# Patient Record
Sex: Female | Born: 1960 | Race: White | Hispanic: No | Marital: Married | Smoking: Never smoker
Health system: Southern US, Community
[De-identification: ages and names within clinical notes are randomized; demographics above are authoritative.]

## PROBLEM LIST (undated history)

## (undated) DIAGNOSIS — G629 Polyneuropathy, unspecified: Secondary | ICD-10-CM

## (undated) DIAGNOSIS — H409 Unspecified glaucoma: Secondary | ICD-10-CM

## (undated) DIAGNOSIS — G4730 Sleep apnea, unspecified: Secondary | ICD-10-CM

## (undated) DIAGNOSIS — D649 Anemia, unspecified: Secondary | ICD-10-CM

## (undated) DIAGNOSIS — M1990 Unspecified osteoarthritis, unspecified site: Secondary | ICD-10-CM

## (undated) DIAGNOSIS — J45909 Unspecified asthma, uncomplicated: Secondary | ICD-10-CM

## (undated) DIAGNOSIS — K219 Gastro-esophageal reflux disease without esophagitis: Secondary | ICD-10-CM

## (undated) DIAGNOSIS — M810 Age-related osteoporosis without current pathological fracture: Secondary | ICD-10-CM

## (undated) DIAGNOSIS — M797 Fibromyalgia: Secondary | ICD-10-CM

## (undated) DIAGNOSIS — H269 Unspecified cataract: Secondary | ICD-10-CM

## (undated) DIAGNOSIS — T7840XA Allergy, unspecified, initial encounter: Secondary | ICD-10-CM

## (undated) DIAGNOSIS — F32A Depression, unspecified: Secondary | ICD-10-CM

## (undated) DIAGNOSIS — E079 Disorder of thyroid, unspecified: Secondary | ICD-10-CM

## (undated) DIAGNOSIS — F419 Anxiety disorder, unspecified: Secondary | ICD-10-CM

## (undated) DIAGNOSIS — I10 Essential (primary) hypertension: Secondary | ICD-10-CM

## (undated) DIAGNOSIS — E785 Hyperlipidemia, unspecified: Secondary | ICD-10-CM

## (undated) HISTORY — DX: Disorder of thyroid, unspecified: E07.9

## (undated) HISTORY — DX: Sleep apnea, unspecified: G47.30

## (undated) HISTORY — DX: Polyneuropathy, unspecified: G62.9

## (undated) HISTORY — DX: Age-related osteoporosis without current pathological fracture: M81.0

## (undated) HISTORY — DX: Anxiety disorder, unspecified: F41.9

## (undated) HISTORY — DX: Hyperlipidemia, unspecified: E78.5

## (undated) HISTORY — DX: Allergy, unspecified, initial encounter: T78.40XA

## (undated) HISTORY — DX: Unspecified osteoarthritis, unspecified site: M19.90

## (undated) HISTORY — PX: EYE SURGERY: SHX253

## (undated) HISTORY — DX: Unspecified asthma, uncomplicated: J45.909

## (undated) HISTORY — DX: Gastro-esophageal reflux disease without esophagitis: K21.9

## (undated) HISTORY — DX: Depression, unspecified: F32.A

## (undated) HISTORY — DX: Anemia, unspecified: D64.9

## (undated) HISTORY — DX: Unspecified glaucoma: H40.9

## (undated) HISTORY — DX: Unspecified cataract: H26.9

## (undated) HISTORY — DX: Essential (primary) hypertension: I10

## (undated) HISTORY — DX: Fibromyalgia: M79.7

---

## 2010-04-28 HISTORY — PX: OTHER SURGICAL HISTORY: SHX169

## 2011-10-27 HISTORY — PX: INCONTINENCE SURGERY: SHX676

## 2013-03-11 ENCOUNTER — Ambulatory Visit (INDEPENDENT_AMBULATORY_CARE_PROVIDER_SITE_OTHER): Payer: 59 | Admitting: Internal Medicine

## 2013-03-11 ENCOUNTER — Encounter: Payer: Self-pay | Admitting: Internal Medicine

## 2013-03-11 VITALS — BP 125/75 | HR 71 | Ht 63.0 in | Wt 138.8 lb

## 2013-03-11 DIAGNOSIS — E785 Hyperlipidemia, unspecified: Secondary | ICD-10-CM

## 2013-03-11 DIAGNOSIS — G589 Mononeuropathy, unspecified: Secondary | ICD-10-CM

## 2013-03-11 DIAGNOSIS — G629 Polyneuropathy, unspecified: Secondary | ICD-10-CM

## 2013-03-11 MED ORDER — PITAVASTATIN CALCIUM 4 MG PO TABS: 4.0000 mg | ORAL_TABLET | Freq: Every day | ORAL | Status: DC

## 2013-03-11 NOTE — Patient Instructions (Signed)
Your physician wants you to follow-up in: 1 year You will receive a reminder letter in the mail two months in advance. If you don't receive a letter, please call our office to schedule the follow-up appointment.   Your physician has recommended you make the following change in your medication:  START LIVALO 4 MG DAILY   Your physician recommends that you return for a FASTING lipid profile: 3 MONTHS

## 2013-03-11 NOTE — Progress Notes (Signed)
HPI Patient is a 52 yo who was referred by Dr. Ihor Dow for evaluation of hyperlipidemia The patient has known about it for years  Followed in [place] Says she tried all the statins and did not tolerate due to achiness  Lipids in 02/26/13 total cholesterol wsa 336, Trig 200, HDL 70  LDL 227    Patient denies CP  No SOB No palpitations  No PND Has neuropathy in feet she says  Denies claudication.      Allergies  Allergen Reactions  . Penicillins     Hives and tightness in chest    Current Outpatient Prescriptions  Medication Sig Dispense Refill  . DULoxetine (CYMBALTA) 60 MG capsule Take 60 mg by mouth daily.      Marland Kitchen gabapentin (NEURONTIN) 300 MG capsule Take 300 mg by mouth daily. As directed      . levothyroxine (SYNTHROID, LEVOTHROID) 75 MCG tablet Take 75 mcg by mouth daily.      . RELPAX 40 MG tablet Take 40 mg by mouth daily.      . solifenacin (VESICARE) 5 MG tablet Take 5 mg by mouth daily.       No current facility-administered medications for this visit.    No past medical history on file.  No past surgical history on file.  No family history on file.  History   Social History  . Marital Status: Married    Spouse Name: N/A    Number of Children: N/A  . Years of Education: N/A   Occupational History  . Not on file.   Social History Main Topics  . Smoking status: Never Smoker   . Smokeless tobacco: Not on file  . Alcohol Use: Not on file  . Drug Use: Not on file  . Sexual Activity: Not on file   Other Topics Concern  . Not on file   Social History Narrative  . No narrative on file    Review of Systems:  All systems reviewed.  They are negative to the above problem except as previously stated.  Vital Signs: BP 125/75  Pulse 71  Ht 5\' 3"  (1.6 m)  Wt 138 lb 12.8 oz (62.959 kg)  BMI 24.59 kg/m2  Physical Exam Patient is in NAD HEENT:  Normocephalic, atraumatic. EOMI, PERRLA.  Neck: JVP is normal.  No bruits.  Lungs: clear to auscultation. No  rales no wheezes.  Heart: Regular rate and rhythm. Normal S1, S2. No S3.   No significant murmurs. PMI not displaced.  Abdomen:  Supple, nontender. Normal bowel sounds. No masses. No hepatomegaly.  Extremities:   Good distal pulses throughout. No lower extremity edema.  Musculoskeletal :moving all extremities.  Neuro:   alert and oriented x3.  CN II-XII grossly intact.  EKG  SR 71 bpm.   Assessment and Plan:  1.  Hyperlipidemia  Patient with Type I hyperlipidemia  Has been tried on "all statins" in [place]  Last was 7 years ago  Did not tolerate due to achinkess I have given her samples of Livalo 4 mg  She is to take 1/2 every other day  If tolerates increase in 1 wk to qd  F/U lipids and AST in 12 wk Diet is good. She needs to confirm lipids for children    2.  Neuropathy  Followed by primary MD  I do not think related to vasc problem

## 2013-03-12 ENCOUNTER — Telehealth: Payer: Self-pay | Admitting: *Deleted

## 2013-03-12 ENCOUNTER — Encounter: Payer: Self-pay | Admitting: *Deleted

## 2013-03-12 NOTE — Telephone Encounter (Signed)
Received fax from [facility] regarding the letter of medical necessity for this patients LIVALO medication, "coverage is denied because your prescription benefit plan does not cover the requested medication, this decision relates specifically to patient coverage under benefit plan and does not involve any determination of medical judgement",  "you may choose to receive the medication at your own expense"  My initial letter of necessity included intolerance of crestor, simcor and vytorin due to side effects of muscle aches and pains. It was a very detailed letter with dx and failure of formulary medications.  Informed patient of outcome, she states in the past they have NOT covered this medication. I will route this note to Dr Tenny Craw

## 2013-03-12 NOTE — Telephone Encounter (Signed)
This encounter was created in error - please disregard.

## 2013-03-12 NOTE — Telephone Encounter (Signed)
[facility] states they need a letter of necessity to approve the livalo for patient

## 2013-03-12 NOTE — Telephone Encounter (Signed)
Sent in appeal letter to [facility].

## 2013-03-14 NOTE — Telephone Encounter (Signed)
Received fax from [facility] regarding appeal I sent in, "your appeal for coverage of livalo is denied because your pharmacy benefits plan does not cover this medication", " this is the final determination of your claim"

## 2013-03-19 NOTE — Telephone Encounter (Signed)
Plan to provide her with samples as available  See if she tolerates  Then see what lipid are

## 2013-04-15 ENCOUNTER — Other Ambulatory Visit: Payer: Self-pay | Admitting: *Deleted

## 2013-04-15 ENCOUNTER — Telehealth: Payer: Self-pay | Admitting: Internal Medicine

## 2013-04-15 DIAGNOSIS — E785 Hyperlipidemia, unspecified: Secondary | ICD-10-CM

## 2013-04-15 MED ORDER — PITAVASTATIN CALCIUM 4 MG PO TABS: 4.0000 mg | ORAL_TABLET | Freq: Every day | ORAL | Status: DC

## 2013-04-15 NOTE — Telephone Encounter (Signed)
New Prob   Requesting a change for LIVALO due to pt being intolerant to class of medication. Insurance will not cover. Please call.

## 2013-04-15 NOTE — Telephone Encounter (Signed)
Will forward for dr ross review 

## 2013-04-16 ENCOUNTER — Telehealth: Payer: Self-pay

## 2013-04-16 NOTE — Telephone Encounter (Signed)
Gave patient of livalo

## 2013-04-17 NOTE — Telephone Encounter (Signed)
I would like to see 1.  If she tolerates and 2.  What her lipids are on this. She should be set up for lipids 8 wks after starting. Provide patient with samples for now.

## 2013-04-17 NOTE — Telephone Encounter (Signed)
Spoke with pt, Aware of dr Tenny Crawross recommendations. Samples given and lab appt scheduled.

## 2013-04-17 NOTE — Telephone Encounter (Signed)
See other telephone note.  

## 2013-05-06 ENCOUNTER — Ambulatory Visit (INDEPENDENT_AMBULATORY_CARE_PROVIDER_SITE_OTHER): Payer: 59

## 2013-05-06 DIAGNOSIS — E785 Hyperlipidemia, unspecified: Secondary | ICD-10-CM

## 2013-05-06 LAB — LIPID PANEL
Cholesterol: 212 mg/dL — ABNORMAL HIGH (ref 0–200)
HDL: 59.6 mg/dL (ref >39.00)
Total CHOL/HDL Ratio: 4
Triglycerides: 98 mg/dL (ref 0.0–149.0)
VLDL: 19.6 mg/dL (ref 0.0–40.0)

## 2013-05-06 LAB — HEPATIC FUNCTION PANEL
ALBUMIN: 3.9 g/dL (ref 3.5–5.2)
ALT: 15 U/L (ref 0–35)
AST: 19 U/L (ref 0–37)
Alkaline Phosphatase: 45 U/L (ref 39–117)
Bilirubin, Direct: 0 mg/dL (ref 0.0–0.3)
Total Bilirubin: 0.7 mg/dL (ref 0.3–1.2)
Total Protein: 6.9 g/dL (ref 6.0–8.3)

## 2013-05-07 LAB — LDL CHOLESTEROL, DIRECT: Direct LDL: 141 mg/dL

## 2013-05-09 ENCOUNTER — Telehealth: Payer: Self-pay | Admitting: Internal Medicine

## 2013-05-09 NOTE — Telephone Encounter (Signed)
New problem ° ° °Pt returning your call concerning results.  °

## 2013-05-09 NOTE — Telephone Encounter (Signed)
Spoke with pt, aware of lipid results. She can not afford livalo. Note sent to dr Tenny Crawross to review

## 2013-05-20 ENCOUNTER — Telehealth: Payer: Self-pay | Admitting: Internal Medicine

## 2013-05-20 NOTE — Telephone Encounter (Signed)
New message     Need to know what Dr Tenny Crawoss want her to do about her high cholesterol.

## 2013-05-20 NOTE — Telephone Encounter (Signed)
Pt states she has already been on Livalo 2 mg and it this will not be an increase for her.  States the co-pay card do not work for her because of how her insurance is set up.  She would like to know if she should stay on 2 mg or if she needs to increase to 4 mg and what the next step for her would be.  Advised I will forward to Dr Tenny Crawoss and her nurse for further recommendations.

## 2013-05-20 NOTE — Telephone Encounter (Signed)
Follow up  ° ° ° °Returning call back to nurse  °

## 2013-05-20 NOTE — Telephone Encounter (Signed)
lmtcb

## 2013-05-21 NOTE — Telephone Encounter (Signed)
Spoke with pt, will not be able to maintain samples for the pt. Will send a message to [employer] [facility] to see if she would qualify for a study and then will let the pt know. Pt agreed with this plan.

## 2013-06-03 ENCOUNTER — Telehealth: Payer: Self-pay | Admitting: Internal Medicine

## 2013-06-03 NOTE — Telephone Encounter (Signed)
New Message  Pt called states that her Insurance will not pay for the current medications and would like to discuss medication dosage Change.

## 2013-06-05 NOTE — Telephone Encounter (Signed)
Spoke with pt, aware dr Tenny Crawross is reviewing to decide the next step.

## 2013-06-12 ENCOUNTER — Other Ambulatory Visit: Payer: 59

## 2013-07-17 ENCOUNTER — Telehealth: Payer: Self-pay | Admitting: Internal Medicine

## 2013-07-17 NOTE — Telephone Encounter (Signed)
New Message:  Pt is requesting a call back to discuss her options for another cholesterol medication. Pt states she is currently on livola Rx..Marland Kitchen

## 2013-07-17 NOTE — Telephone Encounter (Signed)
Spoke with pt, she is concerned about he cholesterol. She has taken all of the statins by her reports and they caused her pain. livalo was the only one she tolerated but can not afford. appt made for pt to see jeremy in the lipid clinic to discuss options.

## 2013-07-23 ENCOUNTER — Ambulatory Visit (INDEPENDENT_AMBULATORY_CARE_PROVIDER_SITE_OTHER): Payer: 59 | Admitting: Pharmacist

## 2013-07-23 VITALS — Wt 163.0 lb

## 2013-07-23 DIAGNOSIS — Z79899 Other long term (current) drug therapy: Secondary | ICD-10-CM

## 2013-07-23 DIAGNOSIS — E785 Hyperlipidemia, unspecified: Secondary | ICD-10-CM | POA: Insufficient documentation

## 2013-07-23 DIAGNOSIS — E782 Mixed hyperlipidemia: Secondary | ICD-10-CM | POA: Insufficient documentation

## 2013-07-23 MED ORDER — PITAVASTATIN CALCIUM 4 MG PO TABS: 2.0000 mg | ORAL_TABLET | Freq: Every day | ORAL | Status: DC

## 2013-07-23 MED ORDER — EZETIMIBE 10 MG PO TABS: 10.0000 mg | ORAL_TABLET | Freq: Every day | ORAL | Status: DC

## 2013-07-23 NOTE — Progress Notes (Signed)
Patient is a pleasant 53 y.o. WF referred to lipid clinic with Dr. Tenny Crawoss given she can only afford Livalo, and insurance is no longer covering this.  She has a baseline LDL of 227 mg/dL ( and non-HDL of 956266 mg/dL).  Patient failed multiple statins, including Zocor, Lipitor, Crestor, and pravastatin while living in [place]. She moved here from [place] 16 months ago due to her husbands job.  She saw Dr. Tenny Crawoss recently and started her on low dose Livalo, and now she is up to 2 mg daily of Livalo and tolerating it without complaint.  She tells me she was tried on Zetia in the past and tolerated it, however stated it was stopped as it had unknown benefit on heart disease.  Since then then IMPROVE-IT trial has come out and shown Zetia in addition to statin did reduce CV disease in patients with recent CAD.  Patient doesn't have a personal history of CAD, but does have an LDL > 220 mg/dL.  She tells me her whole father's side of the family has elevated cholesterol and heart disease.  No xanthomas found on physical exam today.   Her h/o fibromyalgia has made taking statins difficult.  Patient has [facility] for her medication insurance provider.  This is the one provider that will not cover Livalo even if documented failures to other statins have been seen.  Since Crestor is going generic next 1-2 years, [facility] apparently isn't going to cover Livalo in anticipation of this occurring.  Patient will either need to get samples or consider a different therapy.  PCSK-9 inhibitors will likely be on the market in the next 3 months, but not known what their indication will be.  She doesn't meet criteria for SPIRE-2 study at this time.  Risk Factors:  LDL > 190 mg/dL, family history CAD on father's side - LDL goal at least < 110 mg/dL (21%50% reduction from baseline - 225 mg/dL) Meds:  Livalo 2 mg qd Intolerant:  Zocor, Lipitor, Crestor, and pravastatin (in [place]) - muscle aches in legs and back  Social history:   Non-smoker.  Drinks alcohol socially only.  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   Family history:  Father's side of family all has elevated LDL and CAD.  Her father had CABG x 5 when he was 52 y.o., but never went to the doctor.  Elevated LDL also.  Mother: Brain aneurysm (smoker) Diet:  Eats a healthy breakfast and lunch.  For dinner she admits to eating out multiple times per week given her long work days (12 hours).  She does try to eat chicken with vegetables, lean meats, or salads when she does eat out.  She eats a smaller portion as well.  Eats out likely 3 times per week for dinner.  When she cooks at home, it is healthy.  Doesn't eat desserts.  Drinks 1 diet soda per day.  Drinks unsweet tea. Exercise:  Very little due to her fibromyalgia and peripheral neuropathy.  Labs: 04/2013:  LDL 141, TC 212, TG 98, HDL 60 (on Livalo 2 mg qd) 02/2013:  LDL 227, TC 336, TG 200, HDL 70 (not on lipid lowering therapy)  Current Outpatient Prescriptions  Medication Sig Dispense Refill  . DULoxetine (CYMBALTA) 60 MG capsule Take 60 mg by mouth daily.      Marland Kitchen. gabapentin (NEURONTIN) 300 MG capsule Take 300 mg by mouth daily. As directed      .  levothyroxine (SYNTHROID, LEVOTHROID) 75 MCG tablet Take 75 mcg by mouth daily.      . Pitavastatin Calcium (LIVALO) 4 MG TABS Take 1 tablet (4 mg total) by mouth daily.  30 tablet  11  . RELPAX 40 MG tablet Take 40 mg by mouth daily.      . solifenacin (VESICARE) 5 MG tablet Take 5 mg by mouth daily.       No current facility-administered medications for this visit.   Allergies  Allergen Reactions  . Penicillins     Hives and tightness in chest  . Statins     Muscle aches in past on pravastatin, Crestor, Zocor, Lipitor - while in [place]   No family history on file.

## 2013-07-23 NOTE — Assessment & Plan Note (Addendum)
Given patient tolerating Livalo 2 mg qd (1/2 of 4 mg pills) and can't get this covered by her insurance, we will give her some more samples of this today.  Given results of IMPROVE-IT, and the fact she was able to tolerate this in the past, will have her restart Zetia 10 mg qd today as well.  Would like to get LDL to < 100-110 mg/dL if possible.  Patient and I discussed PCSK-9 inhibitors, as they may be on the market in 3 months.  She is excited about this, as this may be her only option if we can't get Livalo for her (cost / insurance).  She doesn't meet criteria for SPIRE study at this time, so will need to wait to see what insurance coverage and indication for PCSK-9 agents will be.  If can't get her on this, the best we may be able to do for a regimen she can get on, and stay on, may be Livalo with coupon (takes $75 off of cash price), and have her take 1/2 or 1/4 tablet daily + Zetia 10 mg qd.  Patient agreeable to this.  She will try to cut down on eating out as well.  Recheck blood work in August and assess treatment options (Zetia, statin, PCSK-9) at that time. Plan: 1.  Continue Livalo 2 mg once daily (take 1/2 of 4 mg pill) 2.  Start taking Zetia 10 mg once daily 3.  Will come back in middle of August, and based on cholesterol, may try to get you on the newer class of agents (PCSK-9 inhibitor - Evolocumab or Alirocumab) 4.  Recheck cholesterol 10/28/13 (Monday - fasting anytime after 7:30 am), and see me the next day on 10/29/13 at 4:00 pm

## 2013-07-23 NOTE — Patient Instructions (Signed)
1.  Continue Livalo 2 mg once daily (take 1/2 of 4 mg pill) 2.  Start taking Zetia 10 mg once daily 3.  Will come back in middle of August, and based on cholesterol, may try to get you on the newer class of agents (PCSK-9 inhibitor - Evolocumab or Alirocumab) 4.  Recheck cholesterol 10/28/13 (Monday - fasting anytime after 7:30 am), and see me the next day on 10/29/13 at 4:00 pm

## 2013-08-26 ENCOUNTER — Encounter: Payer: Self-pay | Admitting: Internal Medicine

## 2013-09-10 ENCOUNTER — Telehealth: Payer: Self-pay | Admitting: Pharmacist

## 2013-09-10 DIAGNOSIS — Z79899 Other long term (current) drug therapy: Secondary | ICD-10-CM

## 2013-09-10 DIAGNOSIS — E785 Hyperlipidemia, unspecified: Secondary | ICD-10-CM

## 2013-09-10 NOTE — Telephone Encounter (Signed)
Patient calls to tell me that she just had an advanced lipid test [place] and wanted to call me with those results.  She is going to email me a copy later this week, but told me her LDL was 96 mg/dL and LDL-P number was [id number] nmol/L.  [place] [facility] did tell her further management would be needed and for patient to call me.    Patient has a h/o FH with LDL of 227 mg/dL at baseline, and also has a h/o fibromyalgia which has made statin tolerability difficult.    Meds:  Livalo 2 mg (1/2 of 4 mg pill) and Zetia 10 mg qd currently. Intolerant:  Zocor, Lipitor, Crestor, and pravastatin ([place]) - muscle aches in back of legs.  LDL improved from 141 mg/dL in 9/60452/2015 down to 96 mg/dL [facility] 4/09816/2015) since adding Zetia to livalo 2 mg qd.  She is tolerating regimen well.  She is due to see me in 10/2013 and wants to know what to do.    Plan: 1.  Patient advised to increase Livalo to 4 mg qd and continue Zetia 10 mg qd.  The PCSK-9 inhibitors are likely to be approved in 6 weeks, and are likely getting an indication for patients with FH (LDL > 190). 2.  Will recheck an NMR / liver in 6 weeks, and see me soon after.  We may discuss PCSK-9 based on levels and medication tolerability.  If she can't tolerate Livalo 4 mg qd, she will reduce back to 2 mg qd.  To Dr. Tenny Crawoss as Lorain ChildesFYI only.

## 2013-10-25 ENCOUNTER — Other Ambulatory Visit (INDEPENDENT_AMBULATORY_CARE_PROVIDER_SITE_OTHER): Payer: 59

## 2013-10-25 DIAGNOSIS — Z79899 Other long term (current) drug therapy: Secondary | ICD-10-CM

## 2013-10-25 DIAGNOSIS — E785 Hyperlipidemia, unspecified: Secondary | ICD-10-CM

## 2013-10-25 LAB — LIPID PANEL
Cholesterol: 141 mg/dL (ref 0–200)
HDL: 54.2 mg/dL (ref >39.00)
LDL Cholesterol: 71 mg/dL (ref 0–99)
NONHDL: 86.8
Total CHOL/HDL Ratio: 3
Triglycerides: 78 mg/dL (ref 0.0–149.0)
VLDL: 15.6 mg/dL (ref 0.0–40.0)

## 2013-10-25 LAB — HEPATIC FUNCTION PANEL
ALBUMIN: 4 g/dL (ref 3.5–5.2)
ALK PHOS: 53 U/L (ref 39–117)
ALT: 20 U/L (ref 0–35)
AST: 24 U/L (ref 0–37)
Bilirubin, Direct: 0 mg/dL (ref 0.0–0.3)
Total Bilirubin: 0.7 mg/dL (ref 0.2–1.2)
Total Protein: 6.8 g/dL (ref 6.0–8.3)

## 2013-10-28 ENCOUNTER — Other Ambulatory Visit: Payer: 59

## 2013-10-29 ENCOUNTER — Ambulatory Visit (INDEPENDENT_AMBULATORY_CARE_PROVIDER_SITE_OTHER): Payer: 59 | Admitting: Pharmacist

## 2013-10-29 VITALS — Wt 159.0 lb

## 2013-10-29 DIAGNOSIS — E785 Hyperlipidemia, unspecified: Secondary | ICD-10-CM

## 2013-10-29 DIAGNOSIS — Z79899 Other long term (current) drug therapy: Secondary | ICD-10-CM

## 2013-10-29 LAB — NMR LIPOPROFILE WITH LIPIDS
Cholesterol, Total: 157 mg/dL (ref <200)
HDL Particle Number: 37.9 umol/L (ref >=30.5)
HDL SIZE: 9.6 nm (ref >=9.2)
HDL-C: 55 mg/dL (ref >=40)
LDL CALC: 87 mg/dL (ref <100)
LDL Particle Number: 1027 nmol/L — ABNORMAL HIGH (ref <1000)
LDL SIZE: 20.5 nm — AB (ref >20.5)
LP-IR Score: 26 (ref <=45)
Large HDL-P: 5.8 umol/L (ref >=4.8)
Large VLDL-P: <0.8 nmol/L (ref <=2.7)
Small LDL Particle Number: 478 nmol/L (ref <=527)
TRIGLYCERIDES: 77 mg/dL (ref <150)
VLDL SIZE: 45.6 nm (ref <=46.6)

## 2013-10-29 MED ORDER — PITAVASTATIN CALCIUM 4 MG PO TABS: 2.0000 mg | ORAL_TABLET | Freq: Every day | ORAL | Status: DC

## 2013-10-29 MED ORDER — PITAVASTATIN CALCIUM 4 MG PO TABS: 4.0000 mg | ORAL_TABLET | Freq: Every day | ORAL | Status: DC

## 2013-10-29 NOTE — Patient Instructions (Signed)
1.  Cut Livalo 4 mg pills into 1/2 - take 2 mg daily 2.  Continue Zetia 10 mg once daily 3.  Recheck cholesterol in 4 months (02/27/14 - fasting lab), and see Riki RuskJeremy next week 03/04/14 at 4:00 pm

## 2013-10-29 NOTE — Assessment & Plan Note (Signed)
Excellent NMR since taking livalo 4 mg qd + Zetia 10 mg qd.  She is tolerating well and LDL is the best it has ever been, so need to find a way to keep her on this if possible.  She is not a candidate for Praluent at this time.  Patient tells me that she can afford Livalo with the $75 off coupon.  Will reduce to Livalo 2 mg qd today which may also help her with compliance.  Continue Zetia 10 mg qd.  Recheck blood work in 4 months, and see me a few days later.

## 2013-10-29 NOTE — Progress Notes (Signed)
Patient is a pleasant 53 y.o. WF referred to lipid clinic with Dr. Tenny Craw given she can only tolerate Livalo, and insurance is no longer covering this.  She has a baseline LDL of 227 mg/dL ( and non-HDL of 119 mg/dL), and now LDL is 71 mg/dL, and LDL-P number ~ 1478 nmol/L on Livalo 4 mg qd + Zetia 10 mg qd.  Patient failed multiple statins, including Zocor, Lipitor, Crestor, and pravastatin while living in [place]. She moved here from [place] 16 months ago due to her husbands job.  She saw Dr. Tenny Craw recently and started her on low dose Livalo, and now she is up to 4 mg daily of Livalo and tolerating it without complaint. She is also taking Zetia 10 mg qd and tolerating well.  Patient doesn't have a personal history of CAD, but does have an LDL > 220 mg/dL.  She tells me her whole father's side of the family has elevated cholesterol and heart disease.  No xanthomas found on physical exam today.   Her h/o fibromyalgia has made taking statins difficult.  Patient has [facility] for her medication insurance provider.  This is the one provider that will not cover Livalo even if documented failures to other statins have been seen.  Since Crestor is going generic next 1-2 years, [facility] [employer] apparently isn't going to cover Livalo in anticipation of this occurring.  She doesn't have enough FH criteria for Praluent, but given LDL well controlled would like to keep her on Livalo + Zetia if possible.  Coupon exists to reduce cost $75 off Livalo.  She doesn't meet criteria for SPIRE-2 study at this time.  Risk Factors:  LDL > 190 mg/dL, family history CAD on father's side - LDL goal at least < 110 mg/dL (29% reduction from baseline - 225 mg/dL) Meds:  Livalo 4 mg qd + Zetia 10 mg qd Intolerant:  Zocor, Lipitor, Crestor, and pravastatin (in [place]) - muscle aches in legs and back  Social history:  Non-smoker.  Drinks alcohol socially only.  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   Family history:  Father's side of family all has elevated LDL and CAD.  Her father had CABG x 5 when he was 66 y.o., but never went to the doctor.  Elevated LDL also.  Mother: Brain aneurysm (smoker) Diet:  Eats a healthy breakfast and lunch.  For dinner she admits to eating out multiple times per week given her long work days (12 hours).  She does try to eat chicken with vegetables, lean meats, or salads when she does eat out.  She eats a smaller portion as well.  Eats out likely 3 times per week for dinner.  When she cooks at home, it is healthy.  Doesn't eat desserts.  Drinks 1 diet soda per day.  Drinks unsweet tea. Exercise:  Very little due to her fibromyalgia and peripheral neuropathy.  Labs: 09/2013:  LDL 71, TC 157, TG 77, HDL 55, LDL-P number 1027, LFTs normal (Livalo 4 mg qd + Zetia 10 mg qd) ~ 07/2013:  NMR from [facility] - LDL-P number 1568 (Livalo 2 mg + Zetia) 04/2013:  LDL 141, TC 212, TG 98, HDL 60 (on Livalo 2 mg qd) 02/2013:  LDL 227, TC 336, TG 200, HDL 70 (not on lipid lowering therapy)  Current Outpatient Prescriptions  Medication Sig Dispense Refill  . DULoxetine (CYMBALTA) 60 MG capsule Take 60 mg by  mouth daily.      Marland Kitchen. ezetimibe (ZETIA) 10 MG tablet Take 1 tablet (10 mg total) by mouth daily.  30 tablet  11  . gabapentin (NEURONTIN) 300 MG capsule Take 300 mg by mouth daily. As directed      . levothyroxine (SYNTHROID, LEVOTHROID) 75 MCG tablet Take 75 mcg by mouth daily.      . RELPAX 40 MG tablet Take 40 mg by mouth daily.      . solifenacin (VESICARE) 5 MG tablet Take 5 mg by mouth daily.      . Pitavastatin Calcium (LIVALO) 4 MG TABS Take 0.5 tablets (2 mg total) by mouth daily.  30 tablet  11   No current facility-administered medications for this visit.   Allergies  Allergen Reactions  . Penicillins     Hives and tightness in chest  . Statins     Muscle aches in past on pravastatin, Crestor, Zocor, Lipitor - while in [place]   No family  history on file.

## 2014-01-08 ENCOUNTER — Telehealth: Payer: Self-pay | Admitting: Pharmacist

## 2014-01-08 NOTE — Telephone Encounter (Signed)
Pt called to report lab results from PCP.   LDL-P 1606 LDL- 109 HDL- 74 TG- 107 TC- 204  She had to cut down to Livalo 2mg  daily due to cost.  Based on results, she is going to go back to 4mg  daily.  She has an appt with the Lipid clinic in December to recheck labs.

## 2014-02-27 ENCOUNTER — Other Ambulatory Visit (INDEPENDENT_AMBULATORY_CARE_PROVIDER_SITE_OTHER): Payer: 59 | Admitting: *Deleted

## 2014-02-27 DIAGNOSIS — E7800 Pure hypercholesterolemia, unspecified: Secondary | ICD-10-CM

## 2014-02-27 LAB — HEPATIC FUNCTION PANEL
ALBUMIN: 4.4 g/dL (ref 3.5–5.2)
ALT: 26 U/L (ref 0–35)
AST: 25 U/L (ref 0–37)
Alkaline Phosphatase: 52 U/L (ref 39–117)
Bilirubin, Direct: 0 mg/dL (ref 0.0–0.3)
Total Bilirubin: 0.9 mg/dL (ref 0.2–1.2)
Total Protein: 7.2 g/dL (ref 6.0–8.3)

## 2014-03-01 LAB — NMR LIPOPROFILE WITH LIPIDS
Cholesterol, Total: 174 mg/dL (ref 100–199)
HDL Particle Number: 47.3 umol/L (ref >=30.5)
HDL SIZE: 9.2 nm (ref >=9.2)
HDL-C: 71 mg/dL (ref >39)
LDL (calc): 86 mg/dL (ref 0–99)
LDL PARTICLE NUMBER: 1243 nmol/L — AB (ref <1000)
LDL SIZE: 20.7 nm (ref >=20.8)
LP-IR Score: 25 (ref <=45)
Large HDL-P: 8.9 umol/L (ref >=4.8)
Large VLDL-P: 1.1 nmol/L (ref <=2.7)
SMALL LDL PARTICLE NUMBER: 509 nmol/L (ref <=527)
Triglycerides: 87 mg/dL (ref 0–149)
VLDL Size: 40.4 nm (ref <=46.6)

## 2014-03-04 ENCOUNTER — Ambulatory Visit (INDEPENDENT_AMBULATORY_CARE_PROVIDER_SITE_OTHER): Payer: 59 | Admitting: Pharmacist

## 2014-03-04 DIAGNOSIS — E785 Hyperlipidemia, unspecified: Secondary | ICD-10-CM

## 2014-03-04 NOTE — Progress Notes (Signed)
Patient is a pleasant 53 y.o. WF referred to lipid clinic with Dr. Tenny Crawoss given she can only tolerate Livalo, and insurance is no longer covering this.  She has a baseline LDL of 227 mg/dL ( and non-HDL of 409266 mg/dL), and now LDL is 71 mg/dL, and LDL-P number ~ 81191000 nmol/L on Livalo 4 mg qd + Zetia 10 mg qd.  Patient failed multiple statins, including Zocor, Lipitor, Crestor, and pravastatin while living in [place]. She moved here from [place] 16 months ago due to her husbands job.  She saw Dr. Tenny Crawoss recently and started her on low dose Livalo, and now she is up to 4 mg daily of Livalo and tolerating it without complaint. She is also taking Zetia 10 mg qd and tolerating well.  Patient doesn't have a personal history of CAD, but does have an LDL > 220 mg/dL.  She tells me her whole father's side of the family has elevated cholesterol and heart disease.  No xanthomas found on physical exam today.   Her h/o fibromyalgia has made taking statins difficult.  Patient has [facility] for her medication insurance provider.  This is the one provider that will not cover Livalo even if documented failures to other statins have been seen.  Since Crestor is going generic next 1-2 years, [facility] apparently isn't going to cover Livalo in anticipation of this occurring.  She doesn't have enough FH criteria for Praluent, but given LDL well controlled would like to keep her on Livalo + Zetia if possible.  Coupon exists to reduce cost $75 off Livalo.  She doesn't meet criteria for SPIRE-2 study at this time.  Risk Factors:  LDL > 190 mg/dL, family history CAD on father's side - LDL goal at least < 110 mg/dL (14%50% reduction from baseline - 225 mg/dL) Meds:  Livalo 4 mg qd + Zetia 10 mg qd Intolerant:  Zocor, Lipitor, Crestor, and pravastatin (in [place]) - muscle aches in legs and back  Social history:  Non-smoker.  Drinks alcohol socially only.  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   Family history:  Father's side of family all has elevated LDL and CAD.  Her father had CABG x 5 when he was 52 y.o., but never went to the doctor.  Elevated LDL also.  Mother: Brain aneurysm (smoker) Diet:  Her A1c was 5.9 at her last PCP visit so they suggested she changed to a new diet of 1 lb lean protein, 1 lb raw vegetables, and 1 lb coked vegetables each day.  She admits this is almost impossible so she was not able to maintain.  She will have an english muffin with egg whites for breakfast and a Healthy steamer and pear for lunch.   Exercise:  Very little due to her fibromyalgia and peripheral neuropathy.  Labs: 02/2014: LDL 86, TC 174, TG 87, HDL 71, LDL- P 1243; LFTs normal  (Livalo 4mg  and Zetia 10mg ) 12/2013: LDL 109, TC 204, TG 107, HDL 74, LDL-P 1606 (Livalo 2mg  and Zetia 10mg - labs from PCP) 09/2013:  LDL 71, TC 157, TG 77, HDL 55, LDL-P number 1027, LFTs normal (Livalo 4 mg qd + Zetia 10 mg qd) ~ 07/2013:  [facility] - LDL-P number 1568 (Livalo 2 mg + Zetia) 04/2013:  LDL 141, TC 212, TG 98, HDL 60 (on Livalo 2 mg qd) 02/2013:  LDL 227, TC 336, TG 200, HDL 70 (not  on lipid lowering therapy)  Current Outpatient Prescriptions  Medication Sig Dispense Refill  . DULoxetine (CYMBALTA) 60 MG capsule Take 60 mg by mouth daily.    Marland Kitchen. ezetimibe (ZETIA) 10 MG tablet Take 1 tablet (10 mg total) by mouth daily. 30 tablet 11  . gabapentin (NEURONTIN) 300 MG capsule Take 300 mg by mouth daily. As directed    . levothyroxine (SYNTHROID, LEVOTHROID) 75 MCG tablet Take 75 mcg by mouth daily.    . Pitavastatin Calcium (LIVALO) 4 MG TABS Take 0.5 tablets (2 mg total) by mouth daily. 30 tablet 11  . RELPAX 40 MG tablet Take 40 mg by mouth daily.    . solifenacin (VESICARE) 5 MG tablet Take 5 mg by mouth daily.     No current facility-administered medications for this visit.   Allergies  Allergen Reactions  . Penicillins     Hives and tightness in chest  . Statins      Muscle aches in past on pravastatin, Crestor, Zocor, Lipitor - while in [place]   No family history on file.

## 2014-03-04 NOTE — Patient Instructions (Signed)
Continue Livalo 4mg  and Zetia 10mg  daily  Call the patient assistance line at [phone]  Recheck labs in 6 months.

## 2014-03-12 NOTE — Assessment & Plan Note (Addendum)
Pt's cholesterol continues to be well controlled on Livalo 4mg  daily and Zetia 10mg  daily.  She is tolerating therapy well but does complain about the cost.  It is still very expensive even with the co-pay card.  I have given her the number for the patient assistance line to see if she may qualify for any other discounts.  If not, she did say she is willing to pay since it is the only thing that has worked for her.  Will continue same therapy and recheck labs in 6 months.

## 2014-03-14 ENCOUNTER — Ambulatory Visit (INDEPENDENT_AMBULATORY_CARE_PROVIDER_SITE_OTHER): Payer: 59 | Admitting: Internal Medicine

## 2014-03-14 ENCOUNTER — Encounter: Payer: Self-pay | Admitting: Internal Medicine

## 2014-03-14 ENCOUNTER — Other Ambulatory Visit: Payer: 59

## 2014-03-14 VITALS — BP 130/82 | HR 60 | Ht 63.0 in | Wt 162.0 lb

## 2014-03-14 DIAGNOSIS — E785 Hyperlipidemia, unspecified: Secondary | ICD-10-CM

## 2014-03-14 NOTE — Patient Instructions (Signed)
Your physician recommends that you continue on your current medications as directed. Please refer to the Current Medication list given to you today. Your physician wants you to follow-up in: JAN 2017 WITH DR ROSS.  You will receive a reminder letter in the mail two months in advance. If you don't receive a letter, please call our office to schedule the follow-up appointment.

## 2014-03-14 NOTE — Progress Notes (Signed)
HPI Patient is a 53 yo who was referred by Dr. Ihor DowNnodi for evaluation of hyperlipidemia  I saw her 1 year ago  She has been seen by Leda GauzeJ Smart and Ferne CoeS Earl since  Lipids are very good on Livalo and Zetia  She is tolerating without aches Denies CP  No SOB  No dizziness     Allergies  Allergen Reactions  . Penicillins     Hives and tightness in chest  . Statins     Muscle aches in past on pravastatin, Crestor, Zocor, Lipitor - while in [place]    Current Outpatient Prescriptions  Medication Sig Dispense Refill  . DULoxetine (CYMBALTA) 60 MG capsule Take 60 mg by mouth daily.    Marland Kitchen. ezetimibe (ZETIA) 10 MG tablet Take 1 tablet (10 mg total) by mouth daily. 30 tablet 11  . fluticasone (FLONASE) 50 MCG/ACT nasal spray as needed. 1-2 SPRAYS AS NEEDED IN EACH NOSTRIL  2  . levothyroxine (SYNTHROID, LEVOTHROID) 75 MCG tablet Take 75 mcg by mouth daily.    Marland Kitchen. LYRICA 25 MG capsule Take 25 mg by mouth daily. 1-2 TABS QD    . Pitavastatin Calcium (LIVALO) 4 MG TABS Take 0.5 tablets (2 mg total) by mouth daily. 30 tablet 11  . RELPAX 40 MG tablet Take 40 mg by mouth as needed.     . solifenacin (VESICARE) 5 MG tablet Take 5 mg by mouth daily.     No current facility-administered medications for this visit.    Past Medical History  Diagnosis Date  . Hyperlipidemia   . Neuropathy     folloewd pcp    Past Surgical History  Procedure Laterality Date  . Incontinence surgery  10/2011  . Uterine ablation  04/2010    Family History  Problem Relation Age of Onset  . Cancer Mother   . Anuerysm Mother   . Heart attack Father   . Arthritis Sister     History   Social History  . Marital Status: Married    Spouse Name: N/A    Number of Children: N/A  . Years of Education: N/A   Occupational History  . Not on file.   Social History Main Topics  . Smoking status: Never Smoker   . Smokeless tobacco: Not on file  . Alcohol Use: 0.6 - 1.2 oz/week    1-2 Glasses of wine per week  . Drug Use: No  .  Sexual Activity: Not on file   Other Topics Concern  . Not on file   Social History Narrative    Review of Systems:  All systems reviewed.  They are negative to the above problem except as previously stated.  Vital Signs: BP 130/82 mmHg  Pulse 60  Ht 5\' 3"  (1.6 m)  Wt 162 lb (73.483 kg)  BMI 28.70 kg/m2  Physical Exam Patient is in NAD HEENT:  Normocephalic, atraumatic. EOMI, PERRLA.  Neck: JVP is normal.  No bruits.  Lungs: clear to auscultation. No rales no wheezes.  Heart: Regular rate and rhythm. Normal S1, S2. No S3.   No significant murmurs. PMI not displaced.  Abdomen:  Supple, nontender. Normal bowel sounds. No masses. No hepatomegaly.  Extremities:   Good distal pulses throughout. No lower extremity edema.  Musculoskeletal :moving all extremities.  Neuro:   alert and oriented x3.  CN II-XII grossly intact.  EKG  SR 60 bpm.   Assessment and Plan:  1.  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

## 2014-05-07 ENCOUNTER — Telehealth: Payer: Self-pay

## 2014-05-07 NOTE — Telephone Encounter (Signed)
Called patient to let her know that i placed samples of livalo up front for her

## 2014-07-17 ENCOUNTER — Telehealth: Payer: Self-pay | Admitting: *Deleted

## 2014-07-17 NOTE — Telephone Encounter (Signed)
Livalo samples placed at the front desk for patient.

## 2014-08-01 ENCOUNTER — Other Ambulatory Visit: Payer: Self-pay | Admitting: Internal Medicine

## 2014-09-02 ENCOUNTER — Telehealth: Payer: Self-pay | Admitting: Internal Medicine

## 2014-09-02 NOTE — Telephone Encounter (Signed)
I do not think there are other options for anything cheaper Will need to keep getting samples

## 2014-09-02 NOTE — Telephone Encounter (Signed)
New message    Patient calling     Pt c/o medication issue:  1. Name of Medication: Livalo  4 mg   2. How are you currently taking this medication (dosage and times per day)? 4 mg a day   3. Are you having a reaction (difficulty breathing--STAT)? No side effect = the cost  4. What is your medication issue?  $ 164.00 cost is too high. Wants to try something different.

## 2014-09-02 NOTE — Telephone Encounter (Signed)
Called patient back. Discussed Livalo samples and coupon card on [employer] website, which has a customer service number to call. Informed patient that the office has some sample that I will leave at the front desk, so she will not be without while we try to search other avenues. Will forward to Dr. Tenny Crawoss to see if there is a cheaper medication that the patient can switch to if other solutions do not work for patient.

## 2014-09-02 NOTE — Telephone Encounter (Signed)
Tried to reach pt. Mailbox full. Unable to leave message

## 2014-09-03 NOTE — Telephone Encounter (Signed)
Informed patient. She has a new prescription and also will pick up the samples left yesterday at front desk. She called the drug company and was told the $165 is the lowest she can pay.

## 2014-09-03 NOTE — Telephone Encounter (Signed)
Follow up:   Pt would like a call back please per pt missed first call.

## 2014-09-15 ENCOUNTER — Ambulatory Visit: Payer: Self-pay | Admitting: Pharmacist

## 2014-09-17 ENCOUNTER — Other Ambulatory Visit: Payer: 59

## 2014-09-19 ENCOUNTER — Ambulatory Visit: Payer: 59 | Admitting: Pharmacist

## 2014-09-20 ENCOUNTER — Other Ambulatory Visit: Payer: Self-pay | Admitting: Internal Medicine

## 2014-09-22 ENCOUNTER — Other Ambulatory Visit: Payer: Self-pay

## 2014-09-22 MED ORDER — EZETIMIBE 10 MG PO TABS: ORAL_TABLET | ORAL | Status: DC

## 2014-09-26 ENCOUNTER — Ambulatory Visit (INDEPENDENT_AMBULATORY_CARE_PROVIDER_SITE_OTHER): Payer: 59 | Admitting: Pharmacist

## 2014-09-26 DIAGNOSIS — E785 Hyperlipidemia, unspecified: Secondary | ICD-10-CM | POA: Diagnosis not present

## 2014-10-01 NOTE — Progress Notes (Signed)
Patient is a pleasant 54 y.o. WF referred to lipid clinic with Dr. Tenny Craw given a history of statin intolerance.  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    She has a baseline LDL of 227 mg/dL ( and non-HDL of 161 mg/dL), and now LDL is 89 mg/dL, and LDL-P number ~ 0960 nmol/L on Livalo 4 mg qd + Zetia 10 mg qd.  Patient failed multiple statins, including Zocor, Lipitor, Crestor, and pravastatin while living in [place]. She moved here from [place] 2 years ago due to her husbands job.   Patient doesn't have a personal history of CAD, but does have an LDL > 220 mg/dL.  She tells me her whole father's side of the family has elevated cholesterol and heart disease.  No xanthomas found on physical exam today.   Her h/o fibromyalgia has made taking statins difficult.  Patient has [facility] for her medication insurance provider.  This is the one provider that will not cover Livalo even if documented failures to other statins have been seen.  We attempted tier exception in the past with no luck as well.  She doesn't have enough FH criteria for Praluent, but given LDL well controlled would like to keep her on Livalo + Zetia if possible.  Coupon exists to reduce cost $75 off Livalo.  She doesn't meet criteria for SPIRE-2 study at this time.  Risk Factors:  LDL > 190 mg/dL, family history CAD on father's side - LDL goal at least < 110 mg/dL (45% reduction from baseline - 225 mg/dL) Meds:  Livalo 4 mg qd + Zetia 10 mg qd Intolerant:  Zocor, Lipitor, Crestor, and pravastatin (in [place]) - muscle aches in legs and back  Social history:  Non-smoker.  Drinks alcohol socially only.  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 It's a 12 hour day with work + commute.   Family history:  Father's side of family all has elevated LDL and CAD.  Her father had CABG x 5 when he was 33 y.o., but never went to the doctor.  Elevated LDL also.  Mother: Brain  aneurysm (smoker) Diet:  She is interested in starting the [employer].  This is mainly a vegetarian diet with 10% fat.    Labs: 08/2014: LDL 89, TC 182, TG 98, HDL 73, LDL-P 1384, LFTs normal (Livalo  and Zetia ) 02/2014: LDL 86, TC 174, TG 87, HDL 71, LDL- P 1243; LFTs normal  (Livalo  and Zetia ) 12/2013: LDL 109, TC 204, TG 107, HDL 74, LDL-P 1606 (Livalo  and Zetia - labs from PCP) 09/2013:  LDL 71, TC 157, TG 77, HDL 55, LDL-P number 1027, LFTs normal (Livalo 4 mg qd + Zetia 10 mg qd) ~ 07/2013:  NMR from [facility] - LDL-P number 1568 (Livalo 2 mg + Zetia) 04/2013:  LDL 141, TC 212, TG 98, HDL 60 (on Livalo 2 mg qd) 02/2013:  LDL 227, TC 336, TG 200, HDL 70 (not on lipid lowering therapy)  Current Outpatient Prescriptions  Medication Sig Dispense Refill  . mirabegron ER (MYRBETRIQ) 25 MG TB24 tablet Take 25 mg by mouth daily.    . DULoxetine (CYMBALTA) 60 MG capsule Take 60 mg by mouth daily.    Marland Kitchen ezetimibe (ZETIA) 10 MG tablet TAKE 1 TABLET (10 MG TOTAL) BY MOUTH DAILY. 30 tablet 9  . fluticasone (FLONASE) 50 MCG/ACT nasal spray as needed. 1-2 SPRAYS AS  NEEDED IN EACH NOSTRIL  2  . levothyroxine (SYNTHROID, LEVOTHROID) 75 MCG tablet Take 75 mcg by mouth daily.    Marland Kitchen. LYRICA 25 MG capsule Take 25 mg by mouth daily. 1-2 TABS QD    . Pitavastatin Calcium (LIVALO) 4 MG TABS Take 0.5 tablets (2 mg total) by mouth daily. 30 tablet 11  . RELPAX 40 MG tablet Take 40 mg by mouth as needed.      No current facility-administered medications for this visit.   Allergies  Allergen Reactions  . Penicillins     Hives and tightness in chest  . Statins     Muscle aches in past on pravastatin, Crestor, Zocor, Lipitor - while in [place]   Family History  Problem Relation Age of Onset  . Cancer Mother   . Anuerysm Mother   . Heart attack Father   . Arthritis Sister    Assessment and Plan 1.  Hyperlipidemia- Pt's cholesterol relatively stable over the past 6 months.  Her  LDL-P increased slightly but still less than last year.  Researched [place] diet for patient.  After 5 years of diet, exercise and supportive relationships, pt's had only a 20% reduction in LDL and no real difference in cardiovascular outcomes.  Relayed information to patient.  Encouraged healthy diet and lifestyle activities but did explain we may not see a hugh improvement in her labs because of this but it would help her overall health.  She is due to see Dr. Tenny Crawoss again in 6 months so will follow up at that time.

## 2014-12-29 ENCOUNTER — Other Ambulatory Visit: Payer: Self-pay | Admitting: Internal Medicine

## 2015-03-02 ENCOUNTER — Other Ambulatory Visit: Payer: Self-pay | Admitting: *Deleted

## 2015-03-02 ENCOUNTER — Telehealth: Payer: Self-pay | Admitting: Internal Medicine

## 2015-03-02 MED ORDER — PITAVASTATIN CALCIUM 4 MG PO TABS: 2.0000 mg | ORAL_TABLET | Freq: Every day | ORAL | Status: DC

## 2015-03-02 NOTE — Telephone Encounter (Signed)
°*  STAT* If patient is at the pharmacy, call can be transferred to refill team.   1. Which medications need to be refilled? (please list name of each medication and dose if known) Livelo 4mg    2. Which pharmacy/location (including street and city if local pharmacy) is medication to be sent to? [facility] off [address]. No number provided.   3. Do they need a 30 day or 90 day supply? 30 day supply   Comments: Appt Sch 04/19/2014 @ 9:15a. Per pt Pharmacy advised pt to call the office to receive refills,

## 2015-04-19 NOTE — Progress Notes (Signed)
Cardiology Office Note   Date:  04/20/2015   ID:  Christine Berger, DOB 11-22-1960, MRN [id number]  PCP:  Gretel Acre, MD  Cardiologist:   Dietrich Pates, MD    F/U of hyperlipidemia     History of Present Illness: Christine Berger is a 55 y.o. female with a history of hyperlipidemiea   I saw her for the first time in December 2015 She has been seen by Ferne Coe since   Continues on Livalo and Zetia  Denies CP  No SOB  No dizziness    ON estrogen for fatigue  Doing better  Plan is for use  For a couple years then taper      Current Outpatient Prescriptions  Medication Sig Dispense Refill  . DULoxetine (CYMBALTA) 60 MG capsule Take 60 mg by mouth daily.    Marland Kitchen ezetimibe (ZETIA) 10 MG tablet TAKE 1 TABLET (10 MG TOTAL) BY MOUTH DAILY. 30 tablet 9  . fluticasone (FLONASE) 50 MCG/ACT nasal spray as needed. 1-2 SPRAYS AS NEEDED IN EACH NOSTRIL  2  . levothyroxine (SYNTHROID, LEVOTHROID) 75 MCG tablet Take 75 mcg by mouth daily.    Marland Kitchen LYRICA 25 MG capsule Take 25 mg by mouth daily. 1-2 TABS QD    . MIMVEY 1-0.5 MG tablet Take 1 tablet by mouth daily.  8  . mirabegron ER (MYRBETRIQ) 25 MG TB24 tablet Take 25 mg by mouth daily.    . Pitavastatin Calcium (LIVALO) 4 MG TABS Take 0.5 tablets (2 mg total) by mouth daily. (Patient taking differently: Take 4 mg by mouth daily. ) 30 tablet 11  . RELPAX 40 MG tablet Take 40 mg by mouth as needed.      No current facility-administered medications for this visit.    Allergies:   Penicillins and Statins   Past Medical History  Diagnosis Date  . Hyperlipidemia   . Neuropathy (HCC)     folloewd pcp    Past Surgical History  Procedure Laterality Date  . Incontinence surgery  10/2011  . Uterine ablation  04/2010     Social History:  The patient  reports that she has never smoked. She does not have any smokeless tobacco history on file. She reports that she drinks about 0.6 - 1.2 oz of alcohol per week. She reports that she does not use illicit  drugs.   Family History:  The patient's family history includes Anuerysm in her mother; Arthritis in her sister; Cancer in her mother; Heart attack in her father.    ROS:  Please see the history of present illness. All other systems are reviewed and  Negative to the above problem except as noted.    PHYSICAL EXAM: VS:  BP 122/70 mmHg  Pulse 72  Ht  (1.6 m)  Wt 74.844 kg (165 lb)  BMI 29.24 kg/m2  GEN: Well nourished, well developed, in no acute distress HEENT: normal Neck: no JVD, carotid bruits, or masses Cardiac: RRR; no murmurs, rubs, or gallops,no edema  Respiratory:  clear to auscultation bilaterally, normal work of breathing GI: soft, nontender, nondistended, + BS  No hepatomegaly  MS: no deformity Moving all extremities   Skin: warm and dry, no rash Neuro:  Strength and sensation are intact Psych: euthymic mood, full affect   EKG:  EKG is ordered today.  SR 71 bpm     Lipid Panel    Component Value Date/Time   CHOL 174 02/27/2014 0818   CHOL 141 10/25/2013 0920   TRIG  87 02/27/2014 0818   TRIG 78.0 10/25/2013 0920   HDL 71 02/27/2014 0818   HDL 54.20 10/25/2013 0920   CHOLHDL 3 10/25/2013 0920   VLDL 15.6 10/25/2013 0920   LDLCALC 86 02/27/2014 0818   LDLCALC 71 10/25/2013 0920   LDLDIRECT 141.0 05/06/2013 0745      Wt Readings from Last 3 Encounters:  04/20/15 74.844 kg (165 lb)  03/14/14 73.483 kg (162 lb)  10/29/13 72.122 kg (159 lb)      ASSESSMENT AND PLAN:  1  HL  Will gett fasting labs from primary  They will fax  To be done this month  2.  Hypothyroidism  Continue meds   3.  Gyn  OK to use esrogen for now  Will check on pricing of meds      Current medicines are reviewed at length with the patient today.  The patient does not have concerns regarding medicines.   Disposition:   FU with  Me in 1 year    Signed, Dietrich Pates, MD  04/20/2015 9:34 AM    [facility] [address] [place], [place], [place]   [address] Phone: [phone]; Fax: [phone]

## 2015-04-20 ENCOUNTER — Ambulatory Visit (INDEPENDENT_AMBULATORY_CARE_PROVIDER_SITE_OTHER): Payer: 59 | Admitting: Internal Medicine

## 2015-04-20 ENCOUNTER — Encounter: Payer: Self-pay | Admitting: Internal Medicine

## 2015-04-20 VITALS — BP 122/70 | HR 72 | Ht 63.0 in | Wt 165.0 lb

## 2015-04-20 DIAGNOSIS — E785 Hyperlipidemia, unspecified: Secondary | ICD-10-CM | POA: Diagnosis not present

## 2015-04-20 NOTE — Patient Instructions (Signed)
Your physician recommends that you continue on your current medications as directed. Please refer to the Current Medication list given to you today. Your physician wants you to follow-up in: 1 year with Dr. Ross.  You will receive a reminder letter in the mail two months in advance. If you don't receive a letter, please call our office to schedule the follow-up appointment.  

## 2015-04-30 ENCOUNTER — Encounter: Payer: Self-pay | Admitting: Internal Medicine

## 2015-04-30 ENCOUNTER — Telehealth: Payer: Self-pay | Admitting: Internal Medicine

## 2015-04-30 NOTE — Telephone Encounter (Signed)
New message      Calling to see if we have received lab results from Dr Threasa Beards.  Pt states her cholesterol is very high.  Will Dr Tenny Craw need to adjust her cholesterol medication?

## 2015-05-01 NOTE — Telephone Encounter (Signed)
Left a message for patient to call back. 

## 2015-05-01 NOTE — Telephone Encounter (Signed)
Patient called back. I informed that Dr. Tenny Craw has received her lab work and is conferring with our PharmD.  Once she decides what the best plan is for patient we wll call her back.    Pt states if she is to continue on Livalo, she will need a prescription sent to her[place]

## 2015-05-05 ENCOUNTER — Telehealth: Payer: Self-pay | Admitting: Internal Medicine

## 2015-05-05 MED ORDER — PITAVASTATIN CALCIUM 4 MG PO TABS: 2.0000 mg | ORAL_TABLET | Freq: Every day | ORAL | Status: DC

## 2015-05-05 MED ORDER — EZETIMIBE 10 MG PO TABS: ORAL_TABLET | ORAL | Status: DC

## 2015-05-05 MED ORDER — EZETIMIBE 10 MG PO TABS: 10.0000 mg | ORAL_TABLET | Freq: Every day | ORAL | Status: DC

## 2015-05-05 NOTE — Telephone Encounter (Signed)
Recomm that she stay on current regimen  Please refill

## 2015-05-05 NOTE — Telephone Encounter (Signed)
Follow Up ° °Pt returned call//  °

## 2015-05-05 NOTE — Telephone Encounter (Signed)
Follow up      Calling to see what Dr Tenny Craw is wanting to do regarding her high cholesterol.  Pt thinks a nurse called her today to tell her.

## 2015-05-05 NOTE — Telephone Encounter (Signed)
Left message for patient to call back  

## 2015-05-05 NOTE — Telephone Encounter (Signed)
Called the pt back to inform her that Dr Charlott Rakes nurse tried to make contact with her earlier this morning to inform her that Dr Tenny Craw, recommends that she continue on her current medication regimen and statin therapy and refill these to her pharmacy of choice.  Per the pt she request that the Zetia be filled [facility] [place].  Per the pt she request that her Livalo be printed off as a hard script and filled for a 90 day supply with one year of refills.  Pt states this has to be printed off and signed by Dr Tenny Craw, and then she will mail this to her insurance company/pharmacy, to make this med more cost effective. Informed the pt that Dr Tenny Craw and her nurse are out of the office today, but Dr Tenny Craw will be back on Thursday, so she could sign this prescription and her nurse can call her back when its available to pick up.  Pt verbalized understanding and agrees with this plan.  Will route this message to Dr Charlott Rakes nurse as an Lorain Childes to follow-up on and call the pt back once script is available for pick-up.

## 2015-05-07 NOTE — Telephone Encounter (Signed)
Message left that signed script will be available at the front desk for her to pick up at her convenience.

## 2015-05-12 NOTE — Telephone Encounter (Signed)
Will sign Rx when I am in clinic on Friday

## 2015-05-15 ENCOUNTER — Telehealth: Payer: Self-pay | Admitting: Internal Medicine

## 2015-05-15 NOTE — Telephone Encounter (Signed)
New message      There is a presc for livalo waiting for pt to pick up.  She want it mailed to her home please.  If any problems, please call

## 2015-05-15 NOTE — Telephone Encounter (Signed)
Prescription pulled from front desk, addressed and placed in outgoing mail.

## 2016-03-17 ENCOUNTER — Telehealth: Payer: Self-pay | Admitting: Internal Medicine

## 2016-03-17 NOTE — Telephone Encounter (Signed)
New message  Livalow 4mg  receives it 3 months at time, and wants to know if she can get prescription to last until her appointment. Needs paper prescription. Wants prescription mailed.

## 2016-04-01 NOTE — Telephone Encounter (Signed)
Left message for patient to call back to clarify. Current order is for 4 mg but patient is only to take 1/2 tablet daily.    Has upcoming appointment with Dr. Tenny Crawoss on 04/22/16

## 2016-04-22 ENCOUNTER — Ambulatory Visit (INDEPENDENT_AMBULATORY_CARE_PROVIDER_SITE_OTHER): Payer: BLUE CROSS/BLUE SHIELD | Admitting: Internal Medicine

## 2016-04-22 ENCOUNTER — Encounter (INDEPENDENT_AMBULATORY_CARE_PROVIDER_SITE_OTHER): Payer: Self-pay

## 2016-04-22 ENCOUNTER — Encounter: Payer: Self-pay | Admitting: Internal Medicine

## 2016-04-22 VITALS — BP 114/66 | HR 72 | Ht 63.0 in | Wt 166.2 lb

## 2016-04-22 DIAGNOSIS — E785 Hyperlipidemia, unspecified: Secondary | ICD-10-CM

## 2016-04-22 MED ORDER — PITAVASTATIN CALCIUM 4 MG PO TABS: 4.0000 mg | ORAL_TABLET | Freq: Every day | ORAL | Status: DC

## 2016-04-22 MED ORDER — EZETIMIBE 10 MG PO TABS: 10.0000 mg | ORAL_TABLET | Freq: Every day | ORAL | 3 refills | Status: DC

## 2016-04-22 NOTE — Progress Notes (Signed)
Cardiology Office Note   Date:  04/22/2016   ID:  Christine Berger, DOB [birth year], MRN [id number]  PCP:  Darnelle MaffucciYANG,HELEN, MD  Cardiologist:   Dietrich PatesPaula Sienna Stonehocker, MD    F/U of HL     History of Present Illness: Christine Berger is a 56 y.o. female with a history of hyperlipidemia  I saw her back in Jan 2017 She continues on [place] and Zetia.  Labs today from outside show some fluctuations in LDL partical number   In September less than 1000  Most recently over 1400 She was eating better earlier in the fall, more vegan diet  Does not exercise regularly  No SOb  No CP         Current Meds  Medication Sig  . DULoxetine (CYMBALTA) 60 MG capsule Take 60 mg by mouth daily.  Marland Kitchen. estradiol (ESTRACE) 2 MG tablet Take 2 mg by mouth daily.   Marland Kitchen. ezetimibe (ZETIA) 10 MG tablet Take 1 tablet (10 mg total) by mouth daily.  . fluticasone (FLONASE) 50 MCG/ACT nasal spray as needed. 1-2 SPRAYS AS NEEDED IN EACH NOSTRIL  . levothyroxine (SYNTHROID, LEVOTHROID) 75 MCG tablet Take 75 mcg by mouth daily.  Marland Kitchen. LYRICA 25 MG capsule Take 25 mg by mouth daily. 1-2 TABS QD  . mirabegron ER (MYRBETRIQ) 25 MG TB24 tablet Take 25 mg by mouth daily.  . Pitavastatin Calcium (LIVALO) 4 MG TABS Take 0.5 tablets (2 mg total) by mouth daily. (Patient taking differently: Take 4 mg by mouth daily. )  . progesterone (PROMETRIUM) 100 MG capsule Take 100 mg by mouth daily.   . RELPAX 40 MG tablet Take 40 mg by mouth as needed.      Allergies:   Penicillins and Statins   Past Medical History:  Diagnosis Date  . Hyperlipidemia   . Neuropathy (HCC)    folloewd pcp    Past Surgical History:  Procedure Laterality Date  . INCONTINENCE SURGERY  10/2011  . uterine ablation  04/2010     Social History:  The patient  reports that she has never smoked. She has never used smokeless tobacco. She reports that she drinks about 0.6 - 1.2 oz of alcohol per week . She reports that she does not use drugs.   Family History:  The patient's  family history includes Anuerysm in her mother; Arthritis in her sister; Cancer in her mother; Heart attack in her father.    ROS:  Please see the history of present illness. All other systems are reviewed and  Negative to the above problem except as noted.    PHYSICAL EXAM: VS:  BP 114/66   Pulse 72   Ht 5\' 3"  (1.6 m)   Wt 166 lb 3.2 oz (75.4 kg)   SpO2 97%   BMI 29.44 kg/m   GEN: Well nourished, well developed, in no acute distress  HEENT: normal  Neck: no JVD, carotid bruits, or masses Cardiac: RRR; no murmurs, rubs, or gallops,no edema  Respiratory:  clear to auscultation bilaterally, normal work of breathing GI: soft, nontender, nondistended, + BS  No hepatomegaly  MS: no deformity Moving all extremities   Skin: warm and dry, no rash Neuro:  Strength and sensation are intact Psych: euthymic mood, full affect   EKG:  EKG is not ordered today.   Lipid Panel    Component Value Date/Time   CHOL 174 02/27/2014 0818   TRIG 87 02/27/2014 0818   HDL 71 02/27/2014 0818   CHOLHDL 3 10/25/2013 0920  VLDL 15.6 10/25/2013 0920   LDLCALC 86 02/27/2014 0818   LDLDIRECT 141.0 05/06/2013 0745      Wt Readings from Last 3 Encounters:  04/22/16 166 lb 3.2 oz (75.4 kg)  04/20/15 165 lb (74.8 kg)  03/14/14 162 lb (73.5 kg)      ASSESSMENT AND PLAN:  1  HL   I would keep on same regimen  Work on diet  She seems committed  Will try to get vegan info to her  I think she would do well based on recent labs.  Stay active  Try to get wt down  Will f/u in 1 year  No cnage in meds     Current medicines are reviewed at length with the patient today.  The patient does not have concerns regarding medicines.  Signed, Dietrich Pates, MD  04/22/2016 9:02 AM    [facility] [address] [place], [place], [place]  [address] Phone: [phone]; Fax: [phone]

## 2016-04-22 NOTE — Telephone Encounter (Signed)
Patient was seen by Dr. Tenny Crawoss in office visit today.  Given written prescription for Livalo, 4 mg to have filled.

## 2016-04-22 NOTE — Patient Instructions (Signed)
Your physician recommends that you continue on your current medications as directed. Please refer to the Current Medication list given to you today. Your physician wants you to follow-up in: 1 year with Dr. Ross.  You will receive a reminder letter in the mail two months in advance. If you don't receive a letter, please call our office to schedule the follow-up appointment.  

## 2016-05-17 ENCOUNTER — Telehealth: Payer: Self-pay | Admitting: *Deleted

## 2016-05-17 DIAGNOSIS — E785 Hyperlipidemia, unspecified: Secondary | ICD-10-CM

## 2016-05-17 NOTE — Telephone Encounter (Signed)
Left message for patient to call back  

## 2016-05-17 NOTE — Telephone Encounter (Signed)
Spoke with patient.  She is interested in dietary referral to discuss vegan diet.  Order placed.  Advised patient to call back if she doesn't hear from anyone.   She is appreciative for the assistance.

## 2016-05-17 NOTE — Telephone Encounter (Signed)
-----   Message from Pricilla RifflePaula Ross V, MD sent at 05/15/2016  7:56 PM EST ----- Spoke to pt re diet She is interested in [employer]egan diet. See if she is interested in dietary referral   I have no other info available

## 2016-06-08 ENCOUNTER — Encounter: Payer: BLUE CROSS/BLUE SHIELD | Attending: Internal Medicine | Admitting: Registered"

## 2016-06-08 DIAGNOSIS — Z713 Dietary counseling and surveillance: Secondary | ICD-10-CM | POA: Diagnosis not present

## 2016-06-08 DIAGNOSIS — E785 Hyperlipidemia, unspecified: Secondary | ICD-10-CM | POA: Insufficient documentation

## 2016-06-08 NOTE — Patient Instructions (Addendum)
Plan: Breakfast: oatmeal is a good option, add protein like a handful of walnuts Lunch: add protein such as beans on salad, lentils Ideas for easy dinners: pre-cooked chicken ([employer]?), pre-cut veggies to microwave, roasted potatoes OR pasta with chicken OR pork loin

## 2016-06-08 NOTE — Progress Notes (Signed)
Medical Nutrition Therapy:  Appt start time: 1000 end time:  1100.  Assessment:  Primary concerns today: Patient states she is interested in losing weight to improve sleep apnea, blood sugar (pre-diabetes), and blood cholesterol. 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 She states she lives with 55 yr old son and husband. Patient states she believes the main problem with her diet is when she gets home from work it is late, she is tired and doesn't feel like cooking so they order out.  Patient reports having several migraines per month with no known triggering event or food.  Preferred Learning Style:   No preference indicated   Learning Readiness:   Ready  MEDICATIONS: reviewed   DIETARY INTAKE:  Usual eating pattern includes 3 meals and 0-1 snacks per day.   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 Asparatame causes asthma.  24-hr recall:  B ( AM): oatmeal with berries or applesauce prior to whey shake/almond milk OR egg and toast Snk ( AM):   L (11 AM): quinoa with roasted vegetables OR salad, apple OR sandwich OR can of soup Snk ( PM): none D ( PM): order out Snk ( PM): none Beverages: unsweet tea or water, sometimes diet soda  Usual physical activity: ADL  Estimated energy needs: 1600 calories 180 g carbohydrates 120 g protein 44 g fat  Progress Towards Goal(s):  In progress.   Nutritional Diagnosis:  [place]-3.3 Overweight/obesity As related to frequent take out for dinner meal.  As evidenced by dietary recall and BMI of >29.    Intervention:  Nutrition Education. Discussed roles of macronutrients in maintaining health. Taught carb counting and identifying foods with carbohydrates and the role they play in blood sugar. Discussed types of fat and those that affect cholesterol and those which provide heart health benefit.  Plan: Breakfast: oatmeal is a good option, add protein like a handful of walnuts Lunch: add protein such  as beans on salad, lentils Ideas for easy dinners: pre-cooked chicken ([employer]?), pre-cut veggies to microwave, roasted potatoes OR pasta with chicken OR pork loin  Teaching Method Utilized:  Visual Auditory  Handouts given during visit include:  My Plate  Carb counting  Types of Fat  Barriers to learning/adherence to lifestyle change: none  Demonstrated degree of understanding via:  Teach Back   Monitoring/Evaluation:  Dietary intake, exercise, migraine frequency, and body weight in 3 week(s).

## 2016-06-30 ENCOUNTER — Ambulatory Visit: Payer: BLUE CROSS/BLUE SHIELD | Admitting: Registered"

## 2017-04-16 ENCOUNTER — Other Ambulatory Visit: Payer: Self-pay | Admitting: Internal Medicine

## 2017-04-27 ENCOUNTER — Encounter: Payer: Self-pay | Admitting: Internal Medicine

## 2017-05-05 ENCOUNTER — Ambulatory Visit: Payer: BLUE CROSS/BLUE SHIELD | Admitting: Internal Medicine

## 2017-05-08 ENCOUNTER — Other Ambulatory Visit: Payer: Self-pay | Admitting: Internal Medicine

## 2017-05-08 ENCOUNTER — Ambulatory Visit: Payer: Self-pay | Admitting: Internal Medicine

## 2017-05-08 MED ORDER — EZETIMIBE 10 MG PO TABS: 10.0000 mg | ORAL_TABLET | Freq: Every day | ORAL | 1 refills | Status: DC

## 2017-05-08 MED ORDER — PITAVASTATIN CALCIUM 4 MG PO TABS: 4.0000 mg | ORAL_TABLET | Freq: Every day | ORAL | 1 refills | Status: DC

## 2017-05-08 NOTE — Telephone Encounter (Signed)
Pt's medication was sent to pt's pharmacy as requested and pt's medication pitavastatin was printed out and mail to pt's house per [place]alene, RN, Dr. Charlott Rakesoss's nurse. I advised the pt that if she has any other problems, questions or concerns to call the office. Pt verbalized understanding.

## 2017-05-08 NOTE — Telephone Encounter (Signed)
New message    *STAT* If patient is at the pharmacy, call can be transferred to refill team.   1. Which medications need to be refilled? (please list name of each medication and dose if known) ezetimibe (ZETIA) 10 MG tablet and Pitavastatin Calcium (LIVALO) 4 MG TABS  2. Which pharmacy/location (including street and city if local pharmacy) is medication to be sent to?  [id number] - [place], [place] - [address] pt says the Zetia needs to be sent to her address?  3. Do they need a 30 day or 90 day supply? 90

## 2017-06-26 ENCOUNTER — Ambulatory Visit: Payer: Self-pay | Admitting: Internal Medicine

## 2017-07-12 ENCOUNTER — Other Ambulatory Visit: Payer: Self-pay | Admitting: Internal Medicine

## 2017-07-27 NOTE — Progress Notes (Signed)
Cardiology Office Note   Date:  07/28/2017   ID:  Leigh Blas, DOB 08-Mar-1961, MRN [id number]  PCP:  Manfred Arch, MD  Cardiologist:   Dietrich Pates, MD    F/U of HL     History of Present Illness: Christine Berger is a 57 y.o. female with a history of hyperlipidemia  I saw her back in Jan 2018 Since seen she has done OK   Having sinus surgery but overall breathing is OK Takes BP at home and hs noticed icreased  130s/80s  USed to be low Not that active. Thinking about diet and wt loss  Has not made changes yet.     Current Meds  Medication Sig  . busPIRone (BUSPAR) 10 MG tablet Take 10 mg by mouth 2 (two) times daily as needed for anxiety.  . cyanocobalamin (,VITAMIN B-12,) 1000 MCG/ML injection Inject 1 mL into the muscle every 30 (thirty) days.  . cyclobenzaprine (FLEXERIL) 10 MG tablet Take 10 mg by mouth 3 (three) times daily as needed for muscle spasms.  . DULoxetine (CYMBALTA) 60 MG capsule Take 60 mg by mouth daily.  Marland Kitchen eletriptan (RELPAX) 40 MG tablet Take 40 mg by mouth as needed for migraine or headache (USE AS DIRECTED). May repeat in 2 hours if headache persists or recurs.  Marland Kitchen estradiol (ESTRACE) 2 MG tablet Take 2 mg by mouth daily.   Marland Kitchen ezetimibe (ZETIA) 10 MG tablet TAKE 1 TABLET (10 MG TOTAL) BY MOUTH DAILY. PLEASE KEEP UPCOMING APPT FOR FUTURE REFILLS.  . fluticasone (FLONASE) 50 MCG/ACT nasal spray Place 2 sprays into both nostrils daily as needed for allergies.  Marland Kitchen levothyroxine (SYNTHROID, LEVOTHROID) 75 MCG tablet Take 75 mcg by mouth daily.  . meloxicam (MOBIC) 7.5 MG tablet Take 7.5 mg by mouth daily as needed for pain.  . mirabegron ER (MYRBETRIQ) 25 MG TB24 tablet Take 25 mg by mouth daily as needed (bladder control).  . Pitavastatin Calcium (LIVALO) 4 MG TABS Take 1 tablet (4 mg total) by mouth daily.  . progesterone (PROMETRIUM) 100 MG capsule Take 100 mg by mouth daily.      Allergies:   Penicillins and Statins   Past Medical History:  Diagnosis  Date  . Hyperlipidemia   . Neuropathy    folloewd pcp    Past Surgical History:  Procedure Laterality Date  . INCONTINENCE SURGERY  10/2011  . uterine ablation  04/2010     Social History:  The patient  reports that she has never smoked. She has never used smokeless tobacco. She reports that she drinks about 0.6 - 1.2 oz of alcohol per week. She reports that she does not use drugs.   Family History:  The patient's family history includes Anuerysm in her mother; Arthritis in her sister; Cancer in her mother; Heart attack in her father.    ROS:  Please see the history of present illness. All other systems are reviewed and  Negative to the above problem except as noted.    PHYSICAL EXAM: VS:  BP (!) 142/88   Pulse 68   Ht 5' 3.5" (1.613 m)   Wt 171 lb 1.9 oz (77.6 kg)   BMI 29.84 kg/m   GEN: OVerweight 56yo  in no acute distress  HEENT: normal  Neck: no JVD, carotid bruits, or masses Cardiac: RRR; no murmurs, rubs, or gallops,no edema  Respiratory:  clear to auscultation bilaterally, normal work of breathing GI: soft, nontender, nondistended, + BS  No hepatomegaly  MS: no  deformity Moving all extremities   Skin: warm and dry, no rash Neuro:  Strength and sensation are intact Psych: euthymic mood, full affect   EKG:  EKG is ordered today.  SR 68 bpm     Lipid Panel    Component Value Date/Time   CHOL 174 02/27/2014 0818   TRIG 87 02/27/2014 0818   HDL 71 02/27/2014 0818   CHOLHDL 3 10/25/2013 0920   VLDL 15.6 10/25/2013 0920   LDLCALC 86 02/27/2014 0818   LDLDIRECT 141.0 05/06/2013 0745      Wt Readings from Last 3 Encounters:  07/28/17 171 lb 1.9 oz (77.6 kg)  06/08/16 164 lb 12.8 oz (74.8 kg)  04/22/16 166 lb 3.2 oz (75.4 kg)      ASSESSMENT AND PLAN:  1  HL   Will check lipids today  2  BP  HIgh on my check as well  Would benefit from wt loss    Keep log  Told her to bring [facility] with her on return in June  3  Wt  Discussed diet plans and exercise  (aerobic / anaerobic)  Discussed gradual and moderate changes in diet  Encouraged her to keep diary      Current medicines are reviewed at length with the patient today.  The patient does not have concerns regarding medicines.  Signed, Dietrich Pates, MD  07/28/2017 9:28 AM    [facility] [address] [place], [place], [place]  [address] Phone: [phone]; Fax: [phone]

## 2017-07-28 ENCOUNTER — Ambulatory Visit: Payer: Self-pay | Admitting: Internal Medicine

## 2017-07-28 ENCOUNTER — Ambulatory Visit: Payer: BLUE CROSS/BLUE SHIELD | Admitting: Internal Medicine

## 2017-07-28 ENCOUNTER — Telehealth: Payer: Self-pay

## 2017-07-28 ENCOUNTER — Encounter: Payer: Self-pay | Admitting: Internal Medicine

## 2017-07-28 VITALS — BP 142/88 | HR 68 | Ht 63.5 in | Wt 171.1 lb

## 2017-07-28 DIAGNOSIS — R030 Elevated blood-pressure reading, without diagnosis of hypertension: Secondary | ICD-10-CM | POA: Diagnosis not present

## 2017-07-28 DIAGNOSIS — E785 Hyperlipidemia, unspecified: Secondary | ICD-10-CM | POA: Diagnosis not present

## 2017-07-28 LAB — LIPID PANEL
Chol/HDL Ratio: 3.2 ratio (ref 0.0–4.4)
Cholesterol, Total: 224 mg/dL — ABNORMAL HIGH (ref 100–199)
HDL: 71 mg/dL (ref >39)
LDL Calculated: 128 mg/dL — ABNORMAL HIGH (ref 0–99)
Triglycerides: 126 mg/dL (ref 0–149)
VLDL Cholesterol Cal: 25 mg/dL (ref 5–40)

## 2017-07-28 LAB — BASIC METABOLIC PANEL
BUN/Creatinine Ratio: 15 (ref 9–23)
BUN: 9 mg/dL (ref 6–24)
CO2: 25 mmol/L (ref 20–29)
CREATININE: 0.59 mg/dL (ref 0.57–1.00)
Calcium: 9.2 mg/dL (ref 8.7–10.2)
Chloride: 101 mmol/L (ref 96–106)
GFR calc Af Amer: 119 mL/min/{1.73_m2} (ref >59)
GFR calc non Af Amer: 104 mL/min/{1.73_m2} (ref >59)
GLUCOSE: 99 mg/dL (ref 65–99)
POTASSIUM: 4.2 mmol/L (ref 3.5–5.2)
SODIUM: 142 mmol/L (ref 134–144)

## 2017-07-28 LAB — CBC
Hematocrit: 40.4 % (ref 34.0–46.6)
Hemoglobin: 13.5 g/dL (ref 11.1–15.9)
MCH: 29.8 pg (ref 26.6–33.0)
MCHC: 33.4 g/dL (ref 31.5–35.7)
MCV: 89 fL (ref 79–97)
PLATELETS: 230 10*3/uL (ref 150–379)
RBC: 4.53 x10E6/uL (ref 3.77–5.28)
RDW: 12.5 % (ref 12.3–15.4)
WBC: 4.4 10*3/uL (ref 3.4–10.8)

## 2017-07-28 LAB — TSH: TSH: 1.59 u[IU]/mL (ref 0.450–4.500)

## 2017-07-28 MED ORDER — PITAVASTATIN CALCIUM 4 MG PO TABS: 4.0000 mg | ORAL_TABLET | Freq: Every day | ORAL | 3 refills | Status: DC

## 2017-07-28 NOTE — Telephone Encounter (Signed)
**Note De-Identified Khaniya Tenaglia Obfuscation** I have done a Livalo PA through covermymeds. Key: [id number]  Message received from covermymeds: Edwyna Perfect Key: [id number] - Rx #: [id number]  Outcome  Approvedtoday  Effective from 07/28/2017 through 07/26/2020.

## 2017-07-28 NOTE — Patient Instructions (Addendum)
Your physician recommends that you continue on your current medications as directed. Please refer to the Current Medication list given to you today.  Your physician recommends that you have lab work today (BMET, CBC, LIPIDS, TSH)  Your physician recommends that you schedule a follow-up appointment in: June, 2019 WITH DR. Tenny Craw.  BRING YOUR BLOOD PRESSURE CUFF AND LIST OF READINGS WITH YOU.

## 2017-08-15 ENCOUNTER — Other Ambulatory Visit: Payer: Self-pay | Admitting: Internal Medicine

## 2017-08-21 ENCOUNTER — Other Ambulatory Visit: Payer: Self-pay | Admitting: Internal Medicine

## 2017-09-21 ENCOUNTER — Ambulatory Visit: Payer: BLUE CROSS/BLUE SHIELD | Admitting: Internal Medicine

## 2017-12-11 ENCOUNTER — Ambulatory Visit: Payer: BLUE CROSS/BLUE SHIELD | Admitting: Internal Medicine

## 2017-12-11 ENCOUNTER — Encounter: Payer: Self-pay | Admitting: Internal Medicine

## 2017-12-11 VITALS — BP 130/62 | HR 74 | Ht 63.5 in | Wt 167.0 lb

## 2017-12-11 DIAGNOSIS — E782 Mixed hyperlipidemia: Secondary | ICD-10-CM | POA: Diagnosis not present

## 2017-12-11 DIAGNOSIS — I10 Essential (primary) hypertension: Secondary | ICD-10-CM

## 2017-12-11 NOTE — Patient Instructions (Signed)
Your physician recommends that you continue on your current medications as directed. Please refer to the Current Medication list given to you today. Your physician wants you to follow-up in: 9 MONTHS WITH DR. Tenny CrawOSS.  You will receive a reminder letter in the mail two months in advance. If you don't receive a letter, please call our office to schedule the follow-up appointment.  When you schedule the follow up, schedule to have lipids drawn about a week prior.

## 2017-12-11 NOTE — Progress Notes (Signed)
Cardiology Office Note   Date:  12/11/2017   ID:  Christine Berger, DOB Oct 19, 1960, MRN [id number]  PCP:  Arne Cleveland, MD  Cardiologist:   Dietrich Pates, MD    F/U of HL     History of Present Illness: Christine Berger is a 57 y.o. female with a history of hyperlipidemia  I saw her back in May 2019 [facility] had blood pressures was high    I recomm that she follow more closely    Since seen she has  Done well   Wt down some   She is working on it BP at home has been around 130/ The pt denies CP   Breathing is OK    Current Meds  Medication Sig  . busPIRone (BUSPAR) 10 MG tablet Take 10 mg by mouth 2 (two) times daily as needed for anxiety.  . cyclobenzaprine (FLEXERIL) 10 MG tablet Take 10 mg by mouth 3 (three) times daily as needed for muscle spasms.  . DULoxetine (CYMBALTA) 60 MG capsule Take 60 mg by mouth daily.  Marland Kitchen eletriptan (RELPAX) 40 MG tablet Take 40 mg by mouth as needed for migraine or headache (USE AS DIRECTED). May repeat in 2 hours if headache persists or recurs.  Marland Kitchen estradiol (ESTRACE) 2 MG tablet Take 2 mg by mouth daily.   Marland Kitchen ezetimibe (ZETIA) 10 MG tablet Take 1 tablet (10 mg total) by mouth daily.  . fluticasone (FLONASE) 50 MCG/ACT nasal spray Place 2 sprays into both nostrils daily as needed for allergies.  Marland Kitchen levothyroxine (SYNTHROID, LEVOTHROID) 75 MCG tablet Take 75 mcg by mouth daily.  Marland Kitchen LIVALO 4 MG TABS TAKE 1 TABLET BY MOUTH DAILY  . meloxicam (MOBIC) 7.5 MG tablet Take 7.5 mg by mouth daily as needed for pain.  . mirabegron ER (MYRBETRIQ) 25 MG TB24 tablet Take 25 mg by mouth daily as needed (bladder control).  . progesterone (PROMETRIUM) 100 MG capsule Take 100 mg by mouth daily.      Allergies:   Penicillins and Statins   Past Medical History:  Diagnosis Date  . Hyperlipidemia   . Neuropathy    folloewd pcp    Past Surgical History:  Procedure Laterality Date  . INCONTINENCE SURGERY  10/2011  . uterine ablation  04/2010     Social History:   The patient  reports that she has never smoked. She has never used smokeless tobacco. She reports that she drinks about 1.0 - 2.0 standard drinks of alcohol per week. She reports that she does not use drugs.   Family History:  The patient's family history includes Anuerysm in her mother; Arthritis in her sister; Cancer in her mother; Heart attack in her father.    ROS:  Please see the history of present illness. All other systems are reviewed and  Negative to the above problem except as noted.    PHYSICAL EXAM: VS:  BP 130/62   Pulse 74   Ht 5' 3.5" (1.613 m)   Wt 167 lb (75.8 kg)   SpO2 95%   BMI 29.12 kg/m   GEN: OVerweight 56yo  in no acute distress  HEENT: normal  Neck: JVP is normal  No carotid bruits, or masses Cardiac: RRR; no murmurs, rubs, or gallops,no edema  Respiratory:  clear to auscultation bilaterally, normal work of breathing GI: soft, nontender, nondistended, + BS  No hepatomegaly  MS: no deformity Moving all extremities   Skin: warm and dry, no rash Neuro:  Strength and sensation are  intact Psych: euthymic mood, full affect   EKG:  EKG is not ordered today.   Lipid Panel    Component Value Date/Time   CHOL 224 (H) 07/28/2017 1005   CHOL 174 02/27/2014 0818   TRIG 126 07/28/2017 1005   TRIG 87 02/27/2014 0818   HDL 71 07/28/2017 1005   HDL 71 02/27/2014 0818   CHOLHDL 3.2 07/28/2017 1005   CHOLHDL 3 10/25/2013 0920   VLDL 15.6 10/25/2013 0920   LDLCALC 128 (H) 07/28/2017 1005   LDLCALC 86 02/27/2014 0818   LDLDIRECT 141.0 05/06/2013 0745      Wt Readings from Last 3 Encounters:  12/11/17 167 lb (75.8 kg)  07/28/17 171 lb 1.9 oz (77.6 kg)  06/08/16 164 lb 12.8 oz (74.8 kg)      ASSESSMENT AND PLAN:  1  HL   Last lipids in May 2019 LDL 128   HDL 71   Pt working on diet    2  BP BP is improved, goood for most part   Continue to follow    3  Wt  Congratulated on wt lose   Stay active  Will f/u in April / May 2020 with lipids prior     Current medicines are reviewed at length with the patient today.  The patient does not have concerns regarding medicines.  Signed, Dietrich PatesPaula Saachi Zale, MD  12/11/2017 4:46 PM    [facility] [address], [place], [place]  [address] Phone: [phone]; Fax: [phone]

## 2017-12-29 LAB — HM HEPATITIS C SCREENING LAB: HM Hepatitis Screen: NEGATIVE

## 2018-02-27 ENCOUNTER — Other Ambulatory Visit: Payer: Self-pay | Admitting: Obstetrics and Gynecology

## 2018-02-27 DIAGNOSIS — R928 Other abnormal and inconclusive findings on diagnostic imaging of breast: Secondary | ICD-10-CM

## 2018-03-15 ENCOUNTER — Other Ambulatory Visit: Payer: Self-pay | Admitting: Physician Assistant

## 2018-03-19 ENCOUNTER — Ambulatory Visit
Admission: RE | Admit: 2018-03-19 | Discharge: 2018-03-19 | Disposition: A | Discharge status: Home / Self Care | Payer: BLUE CROSS/BLUE SHIELD | Source: Ambulatory Visit | Attending: Obstetrics and Gynecology | Admitting: Obstetrics and Gynecology

## 2018-03-19 ENCOUNTER — Ambulatory Visit: Payer: BLUE CROSS/BLUE SHIELD

## 2018-03-19 DIAGNOSIS — R928 Other abnormal and inconclusive findings on diagnostic imaging of breast: Secondary | ICD-10-CM

## 2018-07-24 ENCOUNTER — Other Ambulatory Visit: Payer: Self-pay | Admitting: Internal Medicine

## 2018-10-01 ENCOUNTER — Other Ambulatory Visit: Payer: Self-pay | Admitting: Internal Medicine

## 2018-10-23 ENCOUNTER — Telehealth: Payer: Self-pay | Admitting: Internal Medicine

## 2018-10-23 DIAGNOSIS — Z0000 Encounter for general adult medical examination without abnormal findings: Secondary | ICD-10-CM

## 2018-10-23 DIAGNOSIS — E782 Mixed hyperlipidemia: Secondary | ICD-10-CM

## 2018-10-23 NOTE — Telephone Encounter (Signed)
° °  Patient called and wanted to know if she needed labs or any tests done before her yearly visit in October. She states that Dr. Harrington Challenger does manage her cholesterol with medication, and she will come in for labs prior to the appointment if necessary.

## 2018-10-29 ENCOUNTER — Telehealth: Payer: Self-pay

## 2018-10-29 NOTE — Telephone Encounter (Signed)
Submitted PA through Cover my meds for Livalo 4mg .  KEY: [id number]  Approved today  Effective from 10/29/2018 through 10/27/2021.

## 2018-11-06 NOTE — Telephone Encounter (Signed)
Pt's appointment is not until 4:20 pm in the afternoon. Last labs from Dr. Harrington Challenger were lipids, tsh, cbc, bmet in 07/2017. Will route to Dr. Harrington Challenger.  If she wants pt to have labs prior to her visit, I will call patient and arrange.

## 2018-11-07 NOTE — Telephone Encounter (Signed)
Agree to check labs she had done before (CBC, BMET, TSH, lipids)

## 2018-11-08 NOTE — Telephone Encounter (Signed)
Left the pt a message to call the office back, and ask for a triage nurse, for further assistance in arranging her lab appt  prior to her OV with Dr. Harrington Challenger.

## 2018-11-09 NOTE — Telephone Encounter (Signed)
PT AWARE OF RECOMMENDATIONS AND WILL COME IN 11/14/18 AT 8:00 AM FOR FASTING LABS .Adonis Housekeeper

## 2018-11-14 ENCOUNTER — Other Ambulatory Visit: Payer: Self-pay

## 2018-11-14 ENCOUNTER — Other Ambulatory Visit: Payer: BC Managed Care – PPO | Admitting: *Deleted

## 2018-11-14 DIAGNOSIS — Z0000 Encounter for general adult medical examination without abnormal findings: Secondary | ICD-10-CM

## 2018-11-14 DIAGNOSIS — E782 Mixed hyperlipidemia: Secondary | ICD-10-CM

## 2018-11-14 LAB — BASIC METABOLIC PANEL
BUN/Creatinine Ratio: 15 (ref 9–23)
BUN: 12 mg/dL (ref 6–24)
CO2: 24 mmol/L (ref 20–29)
Calcium: 9 mg/dL (ref 8.7–10.2)
Chloride: 98 mmol/L (ref 96–106)
Creatinine, Ser: 0.81 mg/dL (ref 0.57–1.00)
GFR calc Af Amer: 93 mL/min/{1.73_m2} (ref >59)
GFR calc non Af Amer: 81 mL/min/{1.73_m2} (ref >59)
Glucose: 107 mg/dL — ABNORMAL HIGH (ref 65–99)
Potassium: 4.3 mmol/L (ref 3.5–5.2)
Sodium: 139 mmol/L (ref 134–144)

## 2018-11-14 LAB — CBC
Hematocrit: 41.9 % (ref 34.0–46.6)
Hemoglobin: 13.9 g/dL (ref 11.1–15.9)
MCH: 30 pg (ref 26.6–33.0)
MCHC: 33.2 g/dL (ref 31.5–35.7)
MCV: 90 fL (ref 79–97)
Platelets: 221 10*3/uL (ref 150–450)
RBC: 4.64 x10E6/uL (ref 3.77–5.28)
RDW: 12.1 % (ref 11.7–15.4)
WBC: 4.4 10*3/uL (ref 3.4–10.8)

## 2018-11-14 LAB — LIPID PANEL
Chol/HDL Ratio: 3.4 ratio (ref 0.0–4.4)
Cholesterol, Total: 211 mg/dL — ABNORMAL HIGH (ref 100–199)
HDL: 62 mg/dL (ref >39)
LDL Calculated: 109 mg/dL — ABNORMAL HIGH (ref 0–99)
Triglycerides: 199 mg/dL — ABNORMAL HIGH (ref 0–149)
VLDL Cholesterol Cal: 40 mg/dL (ref 5–40)

## 2018-11-14 LAB — TSH: TSH: 3.31 u[IU]/mL (ref 0.450–4.500)

## 2018-12-26 ENCOUNTER — Other Ambulatory Visit: Payer: Self-pay | Admitting: Internal Medicine

## 2018-12-27 ENCOUNTER — Encounter: Payer: Self-pay | Admitting: Internal Medicine

## 2018-12-27 ENCOUNTER — Other Ambulatory Visit: Payer: Self-pay

## 2018-12-27 ENCOUNTER — Ambulatory Visit (INDEPENDENT_AMBULATORY_CARE_PROVIDER_SITE_OTHER): Payer: BC Managed Care – PPO | Admitting: Internal Medicine

## 2018-12-27 VITALS — BP 146/80 | HR 76 | Ht 64.0 in | Wt 167.2 lb

## 2018-12-27 DIAGNOSIS — I10 Essential (primary) hypertension: Secondary | ICD-10-CM

## 2018-12-27 DIAGNOSIS — E782 Mixed hyperlipidemia: Secondary | ICD-10-CM

## 2018-12-27 NOTE — Patient Instructions (Addendum)
Medication Instructions:  No changes TAKE ZETIA WITH YOUR BIGGEST MEAL OF THE DAY TAKE LIVALO AT NIGHT If you need a refill on your cardiac medications before your next appointment, please call your pharmacy.   Lab work: NONE If you have labs (blood work) drawn today and your tests are completely normal, you will receive your results only by: Marland Kitchen MyChart Message (if you have MyChart) OR . A paper copy in the mail If you have any lab test that is abnormal or we need to change your treatment, we will call you to review the results.  Testing/Procedures: NONE  . Follow-Up IN 4 WEEKS WITH A NURSE VISIT FOR BLOOD PRESSURE CHECK, BRING YOUR CUFF AND A LIST OF BP READINGS WITH YOU.  Any Other Special Instructions Will Be Listed Below (If Applicable).

## 2018-12-27 NOTE — Progress Notes (Signed)
Cardiology Office Note   Date:  12/27/2018   ID:  Christine Berger, DOB [birth year], MRN [id number]  PCP:  Frederick Peers, MD  Cardiologist:   Dorris Carnes, MD    F/U of HL     History of Present Illness: Christine Berger is a 58 y.o. female with a history of hyperlipidemia  I saw her back in May 2019  At that time her BP was a little high   I recomm following  Since seen she has done OK from a cardiac standpoint   Denies CP   Breathing is OK  She does not walk regularly    Does not take BP measurements at home    Current Meds  Medication Sig  . busPIRone (BUSPAR) 10 MG tablet Take 10 mg by mouth 2 (two) times daily as needed for anxiety.  . cyclobenzaprine (FLEXERIL) 10 MG tablet Take 10 mg by mouth 3 (three) times daily as needed for muscle spasms.  . DULoxetine (CYMBALTA) 60 MG capsule Take 60 mg by mouth daily.  Marland Kitchen eletriptan (RELPAX) 40 MG tablet Take 40 mg by mouth as needed for migraine or headache (USE AS DIRECTED). May repeat in 2 hours if headache persists or recurs.  Marland Kitchen estradiol (ESTRACE) 2 MG tablet Take 2 mg by mouth daily.   Marland Kitchen ezetimibe (ZETIA) 10 MG tablet TAKE 1 TABLET BY MOUTH EVERY DAY  . fluticasone (FLONASE) 50 MCG/ACT nasal spray Place 2 sprays into both nostrils daily as needed for allergies.  Marland Kitchen levothyroxine (SYNTHROID, LEVOTHROID) 75 MCG tablet Take 75 mcg by mouth daily.  Marland Kitchen LIVALO 4 MG TABS TAKE 1 TABLET BY MOUTH EVERY DAY  . meloxicam (MOBIC) 7.5 MG tablet Take 7.5 mg by mouth daily as needed for pain.  . progesterone (PROMETRIUM) 100 MG capsule Take 100 mg by mouth daily.      Allergies:   Penicillins, Erythromycin, and Statins   Past Medical History:  Diagnosis Date  . Hyperlipidemia   . Neuropathy    folloewd pcp    Past Surgical History:  Procedure Laterality Date  . INCONTINENCE SURGERY  10/2011  . uterine ablation  04/2010     Social History:  The patient  reports that she has never smoked. She has never used smokeless tobacco.  She reports current alcohol use of about 1.0 - 2.0 standard drinks of alcohol per week. She reports that she does not use drugs.   Family History:  The patient's family history includes Anuerysm in her mother; Arthritis in her sister; Cancer in her mother; Heart attack in her father.    ROS:  Please see the history of present illness. All other systems are reviewed and  Negative to the above problem except as noted.    PHYSICAL EXAM: VS:  BP (!) 146/80   Pulse 76   Ht 5\' 4"  (1.626 m)   Wt 167 lb 3.2 oz (75.8 kg)   BMI 28.70 kg/m   GEN: OVerweight 58yo  in no acute distress  HEENT: normal  Neck: JVP is normal  No carotid bruits, or masses Cardiac: RRR; no murmurs, rubs, or gallops,no edema  Respiratory:  clear to auscultation bilaterally, normal work of breathing GI: soft, nontender, nondistended, + BS  No hepatomegaly  MS: no deformity Moving all extremities   Skin: warm and dry, no rash Neuro:  Strength and sensation are intact Psych: euthymic mood, full affect   EKG:  EKG is  ordered today.  SR 76 bpm  Lipid Panel    Component Value Date/Time   CHOL 211 (H) 11/14/2018 0759   CHOL 174 02/27/2014 0818   TRIG 199 (H) 11/14/2018 0759   TRIG 87 02/27/2014 0818   HDL 62 11/14/2018 0759   HDL 71 02/27/2014 0818   CHOLHDL 3.4 11/14/2018 0759   CHOLHDL 3 10/25/2013 0920   VLDL 15.6 10/25/2013 0920   LDLCALC 109 (H) 11/14/2018 0759   LDLCALC 86 02/27/2014 0818   LDLDIRECT 141.0 05/06/2013 0745      Wt Readings from Last 3 Encounters:  12/27/18 167 lb 3.2 oz (75.8 kg)  12/11/17 167 lb (75.8 kg)  07/28/17 171 lb 1.9 oz (77.6 kg)      ASSESSMENT AND PLAN:  1  HL   Last lipids in May 2019 LDL 128   HDL 71   Pt working on diet  (now [employer])  Will follow up lipids later in the winter    2  BP REcomm she follow BP at home   Keep log  Bring cuff and log for nurses check in 4 to 6 wks     3  Wt  Encouraged her to stay active    F/U in 1 year   Current  medicines are reviewed at length with the patient today.  The patient does not have concerns regarding medicines.  Signed, Dietrich Pates, MD  12/27/2018 4:28 PM    [facility] [address] [place], [place], [place]  [address] Phone: [phone]; Fax: [phone]

## 2019-01-03 ENCOUNTER — Other Ambulatory Visit: Payer: Self-pay | Admitting: Internal Medicine

## 2019-01-03 MED ORDER — EZETIMIBE 10 MG PO TABS: 10.0000 mg | ORAL_TABLET | Freq: Every day | ORAL | 3 refills | Status: DC

## 2019-01-24 ENCOUNTER — Telehealth: Payer: Self-pay | Admitting: Internal Medicine

## 2019-01-24 ENCOUNTER — Ambulatory Visit: Payer: BC Managed Care – PPO

## 2019-01-24 NOTE — Telephone Encounter (Signed)
New message   Patient would like a call back from the nurse she had to cancel nurse appt due to being sick. Please call.

## 2019-01-24 NOTE — Telephone Encounter (Signed)
I spoke to the patient and she had to cancel NV for BP check on 10/29 @ 2pm, because of sore throat.  She is sending in some BP readings for Dr Harrington Challenger and will call to reschedule once feeling better.

## 2019-03-15 ENCOUNTER — Other Ambulatory Visit: Payer: Self-pay | Admitting: Internal Medicine

## 2020-01-02 ENCOUNTER — Other Ambulatory Visit: Payer: Self-pay | Admitting: Internal Medicine

## 2020-01-05 LAB — COLOGUARD: Cologuard: NEGATIVE

## 2020-01-12 LAB — COLOGUARD: COLOGUARD: NEGATIVE

## 2020-01-27 ENCOUNTER — Other Ambulatory Visit: Payer: Self-pay | Admitting: Internal Medicine

## 2020-02-18 ENCOUNTER — Other Ambulatory Visit: Payer: Self-pay | Admitting: Internal Medicine

## 2020-02-26 ENCOUNTER — Other Ambulatory Visit: Payer: Self-pay | Admitting: Internal Medicine

## 2020-03-19 ENCOUNTER — Other Ambulatory Visit: Payer: Self-pay | Admitting: Internal Medicine

## 2020-04-30 ENCOUNTER — Ambulatory Visit: Payer: BC Managed Care – PPO | Admitting: Internal Medicine

## 2020-05-01 ENCOUNTER — Ambulatory Visit: Payer: BC Managed Care – PPO | Admitting: Internal Medicine

## 2020-05-18 NOTE — Progress Notes (Unsigned)
Cardiology Office Note   Date:  05/19/2020   ID:  Christine Berger, Christine Berger 12/27/1960, MRN [id number]  PCP:  Arne Cleveland, MD  Cardiologist:   Dietrich Pates, MD    F/U of HL     History of Present Illness: Christine Berger is a 60 y.o. female with a history of hyperlipidemia  amd HTN I saw the pt in clinic in  Oct 2020 Since seen the patient has done okay from a cardiac standpoint.  She denies chest pain.  Breathing is okay.  She denies palpitation.  She does note the onset of lower extremity edema.  No change in her diet to explain this.  She is wearing support socks now.   Current Meds  Medication Sig  . busPIRone (BUSPAR) 10 MG tablet Take 10 mg by mouth 2 (two) times daily as needed for anxiety.  . conjugated estrogens (PREMARIN) vaginal cream Premarin 0.625 mg/gram vaginal cream  INSERT 0.5 G TWICE A WEEK BY VAGINAL ROUTE AS NEEDED FOR 30 DAYS.  . DULoxetine (CYMBALTA) 60 MG capsule Take 60 mg by mouth daily.  Marland Kitchen eletriptan (RELPAX) 40 MG tablet Take 40 mg by mouth as needed for migraine or headache (USE AS DIRECTED). May repeat in 2 hours if headache persists or recurs.  Marland Kitchen estradiol (ESTRACE) 2 MG tablet Take 2 mg by mouth daily.   Marland Kitchen ezetimibe (ZETIA) 10 MG tablet TAKE 1 TABLET BY MOUTH EVERY DAY **NEED APPT**  . fluticasone (FLONASE) 50 MCG/ACT nasal spray Place 2 sprays into both nostrils daily as needed for allergies.  Marland Kitchen levothyroxine (SYNTHROID, LEVOTHROID) 75 MCG tablet Take 75 mcg by mouth daily.  Marland Kitchen LIVALO 4 MG TABS TAKE 1 TABLET BY MOUTH EVERY DAY  . methocarbamol (ROBAXIN) 500 MG tablet Take 500 mg by mouth 3 times/day as needed-between meals & bedtime for muscle spasms.  . mirabegron ER (MYRBETRIQ) 25 MG TB24 tablet Take 25 mg by mouth 3 times/day as needed-between meals & bedtime (for blader spasms).  . progesterone (PROMETRIUM) 100 MG capsule Take 100 mg by mouth daily.   Marland Kitchen terbinafine (LAMISIL) 250 MG tablet Take 250 mg by mouth daily.     Allergies:    Penicillins, Erythromycin, and Statins   Past Medical History:  Diagnosis Date  . Hyperlipidemia   . Neuropathy    folloewd pcp    Past Surgical History:  Procedure Laterality Date  . INCONTINENCE SURGERY  10/2011  . uterine ablation  04/2010     Social History:  The patient  reports that she has never smoked. She has never used smokeless tobacco. She reports current alcohol use of about 1.0 - 2.0 standard drink of alcohol per week. She reports that she does not use drugs.   Family History:  The patient's family history includes Anuerysm in her mother; Arthritis in her sister; Cancer in her mother; Heart attack in her father.    ROS:  Please see the history of present illness. All other systems are reviewed and  Negative to the above problem except as noted.    PHYSICAL EXAM: VS:  BP 130/68   Pulse 69   Ht 5\' 4"  (1.626 m)   Wt 167 lb 6.4 oz (75.9 kg)   SpO2 93%   BMI 28.73 kg/m   GEN: OVerweight 59yo  in no acute distress  HEENT: normal  Neck: JVP is normal  No carotid bruits, Cardiac: RRR; no murmurs,,no edema  Respiratory:  clear to auscultation bilaterally, normal work of  breathing GI: soft, nontender, nondistended, + BS  No hepatomegaly  MS: no deformity Moving all extremities   Skin: warm and dry, no rash Neuro:  Strength and sensation are intact Psych: euthymic mood, full affect   EKG:  EKG is  ordered today. SR 69     Lipid Panel    Component Value Date/Time   CHOL 211 (H) 11/14/2018 0759   CHOL 174 02/27/2014 0818   TRIG 199 (H) 11/14/2018 0759   TRIG 87 02/27/2014 0818   HDL 62 11/14/2018 0759   HDL 71 02/27/2014 0818   CHOLHDL 3.4 11/14/2018 0759   CHOLHDL 3 10/25/2013 0920   VLDL 15.6 10/25/2013 0920   LDLCALC 109 (H) 11/14/2018 0759   LDLCALC 86 02/27/2014 0818   LDLDIRECT 141.0 05/06/2013 0745      Wt Readings from Last 3 Encounters:  05/19/20 167 lb 6.4 oz (75.9 kg)  12/27/18 167 lb 3.2 oz (75.8 kg)  12/11/17 167 lb (75.8 kg)       ASSESSMENT AND PLAN:  1.  Edema.  Patient with support socks on.  She has trivial edema.  Not sure what led to the change..  With her family history I just go ahead and set up an echocardiogram to evaluate systolic/diastolic function.  Told her to watch salt intake.  Volume status otherwise looks okay.\  2.  HL   last lipids were in June 2021 through the [facility].  LDL 98 HDL 63 triglycerides 102.  Will get calcium score CT as it further guide for medical therapy goals.    2  BP blood pressure is pretty good.  We will continue to follow  3  Wt  Encouraged her to stay active    F/U in 1 year   Current medicines are reviewed at length with the patient today.  The patient does not have concerns regarding medicines.  Signed, Dietrich Pates, MD  05/19/2020 9:52 AM    [facility] [address] [place], [place], [place]  [address] Phone: [phone]; Fax: [phone]

## 2020-05-19 ENCOUNTER — Other Ambulatory Visit: Payer: Self-pay

## 2020-05-19 ENCOUNTER — Ambulatory Visit: Payer: BC Managed Care – PPO | Admitting: Internal Medicine

## 2020-05-19 ENCOUNTER — Encounter: Payer: Self-pay | Admitting: Internal Medicine

## 2020-05-19 VITALS — BP 130/68 | HR 69 | Ht 64.0 in | Wt 167.4 lb

## 2020-05-19 DIAGNOSIS — E7800 Pure hypercholesterolemia, unspecified: Secondary | ICD-10-CM | POA: Diagnosis not present

## 2020-05-19 DIAGNOSIS — R609 Edema, unspecified: Secondary | ICD-10-CM | POA: Diagnosis not present

## 2020-05-19 DIAGNOSIS — I10 Essential (primary) hypertension: Secondary | ICD-10-CM | POA: Diagnosis not present

## 2020-05-19 NOTE — Patient Instructions (Signed)
Medication Instructions:  No changes *If you need a refill on your cardiac medications before your next appointment, please call your pharmacy*   Lab Work: none  Testing/Procedures: Your physician has requested that you have an echocardiogram. Echocardiography is a painless test that uses sound waves to create images of your heart. It provides your doctor with information about the size and shape of your heart and how well your heart's chambers and valves are working. This procedure takes approximately one hour. There are no restrictions for this procedure.  Calcium Score CT scan - out of pocket cost $99.   Follow-Up: At [facility], you and your health needs are our priority.  As part of our continuing mission to provide you with exceptional heart care, we have created designated Provider Care Teams.  These Care Teams include your primary Cardiologist (physician) and Advanced Practice Providers (APPs -  Physician Assistants and Nurse Practitioners) who all work together to provide you with the care you need, when you need it.  Your next appointment:   12 month(s)  The format for your next appointment:   In Person  Provider:   You may see Dietrich Pates, MD or one of the following Advanced Practice Providers on your designated Care Team:    Tereso Newcomer, PA-C  Chelsea Aus, [place]    Other Instructions

## 2020-05-26 ENCOUNTER — Other Ambulatory Visit: Payer: Self-pay

## 2020-05-26 ENCOUNTER — Ambulatory Visit (INDEPENDENT_AMBULATORY_CARE_PROVIDER_SITE_OTHER)
Admission: RE | Admit: 2020-05-26 | Discharge: 2020-05-26 | Disposition: A | Discharge status: Home / Self Care | Payer: Self-pay | Source: Ambulatory Visit | Attending: Internal Medicine | Admitting: Internal Medicine

## 2020-05-26 DIAGNOSIS — I10 Essential (primary) hypertension: Secondary | ICD-10-CM

## 2020-05-26 DIAGNOSIS — E7800 Pure hypercholesterolemia, unspecified: Secondary | ICD-10-CM

## 2020-05-26 DIAGNOSIS — R609 Edema, unspecified: Secondary | ICD-10-CM

## 2020-05-27 ENCOUNTER — Other Ambulatory Visit: Payer: Self-pay | Admitting: Internal Medicine

## 2020-05-27 ENCOUNTER — Telehealth: Payer: Self-pay | Admitting: Internal Medicine

## 2020-05-27 DIAGNOSIS — E7800 Pure hypercholesterolemia, unspecified: Secondary | ICD-10-CM

## 2020-05-27 DIAGNOSIS — R609 Edema, unspecified: Secondary | ICD-10-CM

## 2020-05-27 NOTE — Telephone Encounter (Signed)
Patient reports Dr. Tenny Craw called her twice today and she missed the call.    She will be available today between 1-2 and after 3:30.  Adv we will reach back out to her this afternoon.

## 2020-05-27 NOTE — Telephone Encounter (Signed)
New message:    Patient calling that some one call her concerning results. I did not see a note. Please call patient back.

## 2020-05-28 NOTE — Telephone Encounter (Signed)
Please see result note  Unable to reach  Can call next week if she wants

## 2020-05-29 MED ORDER — LIVALO 4 MG PO TABS: 1.0000 | ORAL_TABLET | Freq: Every day | ORAL | 3 refills | Status: DC

## 2020-05-29 NOTE — Telephone Encounter (Signed)
Notified pt of calcium score results and recommendations. She will continue livalo 4 mg daly. Will need refills and will go to [place] office for lipids on 10/09/20.

## 2020-06-04 ENCOUNTER — Ambulatory Visit (INDEPENDENT_AMBULATORY_CARE_PROVIDER_SITE_OTHER): Payer: BC Managed Care – PPO

## 2020-06-04 ENCOUNTER — Other Ambulatory Visit: Payer: Self-pay

## 2020-06-04 DIAGNOSIS — R609 Edema, unspecified: Secondary | ICD-10-CM | POA: Diagnosis not present

## 2020-06-04 HISTORY — PX: TRANSTHORACIC ECHOCARDIOGRAM: SHX275

## 2020-06-04 LAB — ECHOCARDIOGRAM COMPLETE
AR max vel: 2.52 cm2
AV Area VTI: 2.6 cm2
AV Area mean vel: 2.43 cm2
AV Mean grad: 5 mmHg
AV Peak grad: 9.2 mmHg
Ao pk vel: 1.52 m/s
Area-P 1/2: 4.1 cm2
Calc EF: 54.6 %
S' Lateral: 2.9 cm
Single Plane A2C EF: 51.6 %
Single Plane A4C EF: 57.3 %

## 2020-06-16 ENCOUNTER — Other Ambulatory Visit: Payer: Self-pay | Admitting: Internal Medicine

## 2020-06-16 LAB — HM MAMMOGRAPHY

## 2020-06-25 LAB — HM PAP SMEAR: HM Pap smear: NEGATIVE

## 2020-09-11 ENCOUNTER — Other Ambulatory Visit: Payer: Self-pay | Admitting: Internal Medicine

## 2020-12-04 ENCOUNTER — Ambulatory Visit: Payer: Managed Care, Other (non HMO) | Admitting: Family Medicine

## 2020-12-04 ENCOUNTER — Encounter: Payer: Self-pay | Admitting: Family Medicine

## 2020-12-04 ENCOUNTER — Other Ambulatory Visit: Payer: Self-pay

## 2020-12-04 VITALS — BP 124/59 | HR 66 | Ht 63.0 in | Wt 169.6 lb

## 2020-12-04 DIAGNOSIS — J4520 Mild intermittent asthma, uncomplicated: Secondary | ICD-10-CM

## 2020-12-04 DIAGNOSIS — J302 Other seasonal allergic rhinitis: Secondary | ICD-10-CM | POA: Insufficient documentation

## 2020-12-04 DIAGNOSIS — M797 Fibromyalgia: Secondary | ICD-10-CM

## 2020-12-04 DIAGNOSIS — E782 Mixed hyperlipidemia: Secondary | ICD-10-CM

## 2020-12-04 DIAGNOSIS — F419 Anxiety disorder, unspecified: Secondary | ICD-10-CM | POA: Insufficient documentation

## 2020-12-04 DIAGNOSIS — F3341 Major depressive disorder, recurrent, in partial remission: Secondary | ICD-10-CM

## 2020-12-04 DIAGNOSIS — J45909 Unspecified asthma, uncomplicated: Secondary | ICD-10-CM | POA: Insufficient documentation

## 2020-12-04 DIAGNOSIS — Z7689 Persons encountering health services in other specified circumstances: Secondary | ICD-10-CM | POA: Diagnosis not present

## 2020-12-04 MED ORDER — MONTELUKAST SODIUM 10 MG PO TABS: 10.0000 mg | ORAL_TABLET | Freq: Every day | ORAL | 3 refills | Status: DC

## 2020-12-04 MED ORDER — ALBUTEROL SULFATE HFA 108 (90 BASE) MCG/ACT IN AERS: 2.0000 | INHALATION_SPRAY | RESPIRATORY_TRACT | 2 refills | Status: DC | PRN

## 2020-12-04 MED ORDER — DULOXETINE HCL 60 MG PO CPEP: 60.0000 mg | ORAL_CAPSULE | Freq: Every day | ORAL | 3 refills | Status: DC

## 2020-12-04 NOTE — Patient Instructions (Signed)
Thank you for coming to the office today.  Start Singulair 10mg  daily as needed.  -----------------------  Your symptoms sound most consistent with Silent Reflux or (Laryngopharyngeal Reflux), this is similar to Acid Reflux (or GERD) but usually involves the Throat and has a variety of symptoms including a "globus sensation", or air bubble or pressure in throat. Commonly occurs in patients who have had some symptoms of traditional heartburn before, but this can occur even when not eating spicy foods. IF WE DECIDE TO START - we can order Omeprazole either 20 or 40mg  - Take one capsule 30 min before first meal of day, same time every day for at least 2 weeks, max treatment up to 4 weeks then stop. If it resolves but then comes back again, you can repeat the 2-4 week course again as needed. - Avoid spicy, greasy, fried foods, also things like caffeine, dark chocolate, peppermint can worsen - Elevated head of bed - Avoid large meals and late night snacks, also do not go more than 4-5 hours without a snack or meal (not eating will worsen reflux symptoms due to stomach acid)  If the problem improves but keeps coming back, we can discuss higher dose or longer course at next visit.  If symptoms are worsening, persistent symptoms despite treatment or develop esophageal or abdominal pain, unable to swallow solids or liquids, nausea, vomiting, fever/chills, or unintentional weight loss / no appetite, please follow-up sooner or seek more immediate medical attention.   Please schedule a Follow-up Appointment to: Return in about 4 months (around 04/05/2021) for 4 month follow-up Asthma/GERD, Fibro/PHQ.  If you have any other questions or concerns, please feel free to call the office or send a message through MyChart. You may also schedule an earlier appointment if necessary.  Additionally, you may be receiving a survey about your experience at our office within a few days to 1 week by e-mail or mail. We value  your feedback.  , DO [facility], Saralyn Pilar

## 2020-12-04 NOTE — Progress Notes (Signed)
Subjective:    Patient ID: Christine Berger, female    DOB: 1960/08/20, 60 y.o.   MRN: [id number]  Christine Berger is a 60 y.o. female presenting on 12/04/2020 for Establish Care   HPI  Here for establish care  Asthma GERD Worsening asthma recently. She was evaluated in past few years for GERD, had EGD in past and was normal. She is having issue not feeling the "heartburn" or burning sensation, and question if the stomach acid is bothering her more at night. - Takes Omeprazole 20mg  PRN but not regular - She has taken Albuterol PRN with some relief in past. - She was managed for allergies in past. And has been on Singulair.  Depression, recurrent in partial remission Anxiety She has some chronic mental health conditions that have been managed by her prior PCP Some worsening issue with stress / anxiety with COVID - Taking Wellbutrin XL 150mg  daily - Taking Buspar 10mg  daily PRN  Fibromyalgia Taking Duloxetine 60mg  daily, doing well with fibromyalgia control and helps her mood Takes Methocarbamol   HYPERLIPIDEMIA: Followed by Dr Cardiology - Currently taking Zetia 10mg  daily, tolerating well without side effects or myalgias  GYN Followed by , GYN in [place]. Managing her hormone therapy and bone density.  Bladder Spasms Myrbetriq ER 25mg  PRN for bladder spasm.  Hypothyroidism On levothyroxine daily  On Terbinafine temporarily for toenails.  Health Maintenance:  Mammogram last done in April 2022 Pap smear last done April 2022 Last BMD 1 year ago.  Depression screen [facility] 2/9 12/04/2020 06/08/2016  Decreased Interest 1 0  Down, Depressed, Hopeless 0 0  PHQ - 2 Score 1 0  Altered sleeping 0 -  Tired, decreased energy 2 -  Change in appetite 1 -  Feeling bad or failure about yourself  0 -  Trouble concentrating 2 -  Moving slowly or fidgety/restless 0 -  Suicidal thoughts 0 -  PHQ-9 Score 6 -  Difficult doing work/chores Not difficult at  all -   GAD 7 : Generalized Anxiety Score 12/04/2020  Nervous, Anxious, on Edge 2  Control/stop worrying 0  Worry too much - different things 0  Trouble relaxing 1  Restless 2  Easily annoyed or irritable 0  Afraid - awful might happen 0  Total GAD 7 Score 5  Anxiety Difficulty Not difficult at all      Past Medical History:  Diagnosis Date   Allergy    Anxiety    Asthma    Depression    Fibromyalgia    Glaucoma    Hyperlipidemia    Neuropathy    folloewd pcp   Osteoporosis    Sleep apnea    Thyroid disease    Past Surgical History:  Procedure Laterality Date   INCONTINENCE SURGERY  10/2011   uterine ablation  04/2010   Social History   Socioeconomic History   Marital status: Married    Spouse name: Not on file   Number of children: Not on file   Years of education: Not on file   Highest education level: Not on file  Occupational History   Not on file  Tobacco Use   Smoking status: Never   Smokeless tobacco: Never  Vaping Use   Vaping Use: Never used  Substance and Sexual Activity   Alcohol use: Yes    Alcohol/week: 1.0 - 2.0 standard drink    Types: 1 - 2 Glasses of wine per week   Drug use: No  Sexual activity: Not on file  Other Topics Concern   Not on file  Social History Narrative   Not on file   Social Determinants of Health   Financial Resource Strain: Not on file  Food Insecurity: Not on file  Transportation Needs: Not on file  Physical Activity: Not on file  Stress: Not on file  Social Connections: Not on file  Intimate Partner Violence: Not on file   Family History  Problem Relation Age of Onset   Cancer Mother    Anuerysm Mother    Heart attack Father    Arthritis Sister    Breast cancer Neg Hx    Current Outpatient Medications on File Prior to Visit  Medication Sig   buPROPion (WELLBUTRIN XL) 150 MG 24 hr tablet Take 150 mg by mouth daily.   busPIRone (BUSPAR) 10 MG tablet Take 10 mg by mouth 2 (two) times daily as needed for  anxiety.   conjugated estrogens (PREMARIN) vaginal cream Premarin 0.625 mg/gram vaginal cream  INSERT 0.5 G TWICE A WEEK BY VAGINAL ROUTE AS NEEDED FOR 30 DAYS.   eletriptan (RELPAX) 40 MG tablet Take 40 mg by mouth as needed for migraine or headache (USE AS DIRECTED). May repeat in 2 hours if headache persists or recurs.   estradiol (ESTRACE) 2 MG tablet Take 2 mg by mouth daily.    ezetimibe (ZETIA) 10 MG tablet TAKE 1 TABLET BY MOUTH EVERY DAY **NEED APPT**   fluticasone (FLONASE) 50 MCG/ACT nasal spray Place 2 sprays into both nostrils daily as needed for allergies.   levothyroxine (SYNTHROID, LEVOTHROID) 75 MCG tablet Take 75 mcg by mouth daily.   methocarbamol (ROBAXIN) 500 MG tablet Take 500 mg by mouth 3 times/day as needed-between meals & bedtime for muscle spasms.   mirabegron ER (MYRBETRIQ) 25 MG TB24 tablet Take 25 mg by mouth 3 times/day as needed-between meals & bedtime (for blader spasms).   Pitavastatin Calcium (LIVALO) 4 MG TABS Take 1 tablet (4 mg total) by mouth daily.   progesterone (PROMETRIUM) 100 MG capsule Take 100 mg by mouth daily.    terbinafine (LAMISIL) 250 MG tablet Take 250 mg by mouth daily.   No current facility-administered medications on file prior to visit.    Review of Systems Per HPI unless specifically indicated above       Objective: b    BP (!) 124/59   Pulse 66   Ht 5\' 3"  (1.6 m)   Wt 169 lb 9.6 oz (76.9 kg)   SpO2 94%   BMI 30.04 kg/m   Wt Readings from Last 3 Encounters:  12/04/20 169 lb 9.6 oz (76.9 kg)  05/19/20 167 lb 6.4 oz (75.9 kg)  12/27/18 167 lb 3.2 oz (75.8 kg)    Physical Exam Vitals and nursing note reviewed.  Constitutional:      General: She is not in acute distress.    Appearance: She is well-developed. She is not diaphoretic.     Comments: Well-appearing, comfortable, cooperative  HENT:     Head: Normocephalic and atraumatic.  Eyes:     General:        Right eye: No discharge.        Left eye: No discharge.      Conjunctiva/sclera: Conjunctivae normal.  Neck:     Thyroid: No thyromegaly.  Cardiovascular:     Rate and Rhythm: Normal rate and regular rhythm.     Heart sounds: Normal heart sounds. No murmur heard. Pulmonary:  Effort: Pulmonary effort is normal. No respiratory distress.     Breath sounds: Normal breath sounds. No wheezing or rales.  Musculoskeletal:        General: Normal range of motion.     Cervical back: Normal range of motion and neck supple.  Lymphadenopathy:     Cervical: No cervical adenopathy.  Skin:    General: Skin is warm and dry.     Findings: No erythema or rash.  Neurological:     Mental Status: She is alert and oriented to person, place, and time.  Psychiatric:        Behavior: Behavior normal.     Comments: Well groomed, good eye contact, normal speech and thoughts     Results for orders placed or performed in visit on 06/04/20  ECHOCARDIOGRAM COMPLETE  Result Value Ref Range   AR max vel 2.52 cm2   AV Peak grad 9.2 mmHg   Ao pk vel 1.52 m/s   S' Lateral 2.90 cm   Area-P 1/2 4.10 cm2   AV Area VTI 2.60 cm2   AV Mean grad 5.0 mmHg   Single Plane A4C EF 57.3 %   Single Plane A2C EF 51.6 %   Calc EF 54.6 %   AV Area mean vel 2.43 cm2      Assessment & Plan:   Problem List Items Addressed This Visit     Seasonal allergies   Relevant Medications   montelukast (SINGULAIR) 10 MG tablet   Major depressive disorder, recurrent episode, in partial remission (HCC)   Relevant Medications   buPROPion (WELLBUTRIN XL) 150 MG 24 hr tablet   DULoxetine (CYMBALTA) 60 MG capsule   Hyperlipidemia   Fibromyalgia - Primary   Relevant Medications   buPROPion (WELLBUTRIN XL) 150 MG 24 hr tablet   DULoxetine (CYMBALTA) 60 MG capsule   Extrinsic asthma   Relevant Medications   montelukast (SINGULAIR) 10 MG tablet   Anxiety   Relevant Medications   buPROPion (WELLBUTRIN XL) 150 MG 24 hr tablet   DULoxetine (CYMBALTA) 60 MG capsule   Other Visit Diagnoses      Encounter to establish care with new doctor          Review outside records CareEverywhere  Asthma Allergies Mostly controlled, but some gradual increased episodes of flare up, will pursue allergy therapy with Singulair 10mg  nightly now, may use ALbuterol PRN Consider maintenance inhaler Consider better GERD therapy may have silent reflux as discussed, can try lifestyle measures then may try OTC PPI omeprazole 20mg  first and we can order 40mg  in future if beneficial  Fibromyalgia Controlled on current meds Re order Duloxetine 60mg  daily  Major Depression recurrent in partial remission Anxiety Controlled on current meds Re order Duloxetine    Meds ordered this encounter  Medications   montelukast (SINGULAIR) 10 MG tablet    Sig: Take 1 tablet (10 mg total) by mouth at bedtime.    Dispense:  90 tablet    Refill:  3   DULoxetine (CYMBALTA) 60 MG capsule    Sig: Take 1 capsule (60 mg total) by mouth daily.    Dispense:  90 capsule    Refill:  3      Follow up plan: Return in about 4 months (around 04/05/2021) for 4 month follow-up Asthma/GERD, Fibro/PHQ.  , DO [facility] 12/04/2020, 3:27 PM

## 2020-12-15 ENCOUNTER — Telehealth: Payer: Managed Care, Other (non HMO) | Admitting: Physician Assistant

## 2020-12-15 DIAGNOSIS — B9689 Other specified bacterial agents as the cause of diseases classified elsewhere: Secondary | ICD-10-CM

## 2020-12-15 DIAGNOSIS — J069 Acute upper respiratory infection, unspecified: Secondary | ICD-10-CM | POA: Diagnosis not present

## 2020-12-15 MED ORDER — DOXYCYCLINE HYCLATE 100 MG PO CAPS: 100.0000 mg | ORAL_CAPSULE | Freq: Two times a day (BID) | ORAL | 0 refills | Status: DC

## 2020-12-15 MED ORDER — BENZONATATE 100 MG PO CAPS: 100.0000 mg | ORAL_CAPSULE | Freq: Three times a day (TID) | ORAL | 0 refills | Status: DC | PRN

## 2020-12-15 NOTE — Progress Notes (Signed)
I have spent 5 minutes in review of e-visit questionnaire, review and updating patient chart, medical decision making and response to patient.   Calley Drenning Cody Modi, PA-C    

## 2020-12-15 NOTE — Progress Notes (Signed)
E-Visit for Sinus Problems ? ?We are sorry that you are not feeling well.  Here is how we plan to help! ? ?Based on what you have shared with me it looks like you have sinusitis.  Sinusitis is inflammation and infection in the sinus cavities of the head.  Based on your presentation I believe you most likely have Acute Bacterial Sinusitis.  This is an infection caused by bacteria and is treated with antibiotics. I have prescribed Doxycycline 100mg by mouth twice a day for 10 days. You may use an oral decongestant such as Mucinex D or if you have glaucoma or high blood pressure use plain Mucinex. Saline nasal spray help and can safely be used as often as needed for congestion.  If you develop worsening sinus pain, fever or notice severe headache and vision changes, or if symptoms are not better after completion of antibiotic, please schedule an appointment with a health care provider.   ? ?I have also sent in a prescription cough medication for you.  ? ?Sinus infections are not as easily transmitted as other respiratory infection, however we still recommend that you avoid close contact with loved ones, especially the very young and elderly.  Remember to wash your hands thoroughly throughout the day as this is the number one way to prevent the spread of infection! ? ?Home Care: ?Only take medications as instructed by your medical team. ?Complete the entire course of an antibiotic. ?Do not take these medications with alcohol. ?A steam or ultrasonic humidifier can help congestion.  You can place a towel over your head and breathe in the steam from hot water coming from a faucet. ?Avoid close contacts especially the very young and the elderly. ?Cover your mouth when you cough or sneeze. ?Always remember to wash your hands. ? ?Get Help Right Away If: ?You develop worsening fever or sinus pain. ?You develop a severe head ache or visual changes. ?Your symptoms persist after you have completed your treatment plan. ? ?Make  sure you ?Understand these instructions. ?Will watch your condition. ?Will get help right away if you are not doing well or get worse. ? ?Thank you for choosing an e-visit. ? ?Your e-visit answers were reviewed by a board certified advanced clinical practitioner to complete your personal care plan. Depending upon the condition, your plan could have included both over the counter or prescription medications. ? ?Please review your pharmacy choice. Make sure the pharmacy is open so you can pick up prescription now. If there is a problem, you may contact your provider through MyChart messaging and have the prescription routed to another pharmacy.  Your safety is important to us. If you have drug allergies check your prescription carefully.  ? ?For the next 24 hours you can use MyChart to ask questions about today's visit, request a non-urgent call back, or ask for a work or school excuse. ?You will get an email in the next two days asking about your experience. I hope that your e-visit has been valuable and will speed your recovery. ? ?

## 2021-01-08 ENCOUNTER — Encounter: Payer: Self-pay | Admitting: Family Medicine

## 2021-01-10 ENCOUNTER — Encounter: Payer: Self-pay | Admitting: Family Medicine

## 2021-01-11 ENCOUNTER — Other Ambulatory Visit: Payer: Self-pay

## 2021-01-11 MED ORDER — LEVOTHYROXINE SODIUM 75 MCG PO TABS: 75.0000 ug | ORAL_TABLET | Freq: Every day | ORAL | 3 refills | Status: DC

## 2021-01-11 NOTE — Telephone Encounter (Signed)
Not sure who to send re preauthorization for [facility] requires

## 2021-03-05 ENCOUNTER — Other Ambulatory Visit: Payer: Self-pay

## 2021-03-05 ENCOUNTER — Ambulatory Visit: Payer: Self-pay

## 2021-03-05 ENCOUNTER — Telehealth: Payer: Self-pay

## 2021-03-05 MED ORDER — PAXLOVID (300/100) 20 X 150 MG & 10 X 100MG PO TBPK
3.0000 | ORAL_TABLET | Freq: Two times a day (BID) | ORAL | 0 refills | Status: DC
  Filled 2021-03-05: qty 30, 5d supply, fill #0

## 2021-03-05 NOTE — Telephone Encounter (Signed)
Patient called in to inform Dr Kirtland Bouchard that she just tested positive for Covid and say that her symptoms are more like a cold or flu without a fever. Needing to know what her next step is or if she can get Paxlovid  Ph# [phone]    Chief Complaint: Covid 19 positive Symptoms: Cough, congestion, wheezing Frequency: Yesterday Pertinent Negatives: Patient denies Fever Disposition: [] ED /[] Urgent Care (no appt availability in office) / [] Appointment(In office/virtual)/ []  [facility]/ [] Home Care/ [] Refused Recommended Disposition  Additional Notes: No vailability in the practice today. in the practice instructs pt. May call [facility] and pharmacists can prescribe Paxlovid. Pt. Verbalizes understanding.   Answer Assessment - Initial Assessment Questions 1. COVID-19 DIAGNOSIS: "Who made your COVID-19 diagnosis?" "Was it confirmed by a positive lab test or self-test?" If not diagnosed by a doctor (or NP/PA), ask "Are there lots of cases (community spread) where you live?" Note: See public health department website, if unsure.     Home test 2. COVID-19 EXPOSURE: "Was there any known exposure to COVID before the symptoms began?" CDC Definition of close contact: within 6 feet (2 meters) for a total of 15 minutes or more over a 24-hour period.      No 3. ONSET: "When did the COVID-19 symptoms start?"      Yesterday 4. WORST SYMPTOM: "What is your worst symptom?" (e.g., cough, fever, shortness of breath, muscle aches)     Cough and congestion 5. COUGH: "Do you have a cough?" If Yes, ask: "How bad is the cough?"       Yes 6. FEVER: "Do you have a fever?" If Yes, ask: "What is your temperature, how was it measured, and when did it start?"     No 7. RESPIRATORY STATUS: "Describe your breathing?" (e.g., shortness of breath, wheezing, unable to speak)      Wheezing 8. BETTER-SAME-WORSE: "Are you getting better, staying the same or getting worse compared to yesterday?"  If getting  worse, ask, "In what way?"     Worse 9. HIGH RISK DISEASE: "Do you have any chronic medical problems?" (e.g., asthma, heart or lung disease, weak immune system, obesity, etc.)     RAD 10. VACCINE: "Have you had the COVID-19 vaccine?" If Yes, ask: "Which one, how many shots, when did you get it?"       Yes 11. BOOSTER: "Have you received your COVID-19 booster?" If Yes, ask: "Which one and when did you get it?"       Yes 12. PREGNANCY: "Is there any chance you are pregnant?" "When was your last menstrual period?"       No 13. OTHER SYMPTOMS: "Do you have any other symptoms?"  (e.g., chills, fatigue, headache, loss of smell or taste, muscle pain, sore throat)       Fatigue 14. O2 SATURATION MONITOR:  "Do you use an oxygen saturation monitor (pulse oximeter) at home?" If Yes, ask "What is your reading (oxygen level) today?" "What is your usual oxygen saturation reading?" (e.g., 95%)       No  Protocols used: Coronavirus (COVID-19) Diagnosed or Suspected-A-AH

## 2021-03-05 NOTE — Telephone Encounter (Signed)
Outpatient Pharmacy Oral COVID Treatment Note  I connected with Christine Berger on 03/05/2021/12:15 PM by telephone and verified that I am speaking with the correct person using two identifiers.  I discussed the limitations, risks, security, and privacy concerns of performing an evaluation and management service by telephone and the availability of in person appointments via referral to a physician. The patient expressed understanding and agreed to proceed.  Pharmacy location: [place]  Diagnosis: COVID-19 infection  Purpose of visit: Discussion of potential use of Paxlovid, a new treatment for mild to moderate COVID-19 viral infection in non-hospitalized patients.  Subjective/Objective: Patient is a 60 y.o. female who is presenting with COVID 19 viral infection.  COVID 19 viral infection. Their symptoms began on 03/04/21 with cold symptoms.  The patient has confirmed COVID-19 via a home test   Past Medical History:  Diagnosis Date   Allergy    Anxiety    Asthma    Depression    Fibromyalgia    Glaucoma    Hyperlipidemia    Neuropathy    folloewd pcp   Osteoporosis    Sleep apnea    Thyroid disease      Allergies  Allergen Reactions   Aspartame Cough and Shortness Of Breath   Penicillins     Hives and tightness in chest   Erythromycin Nausea And Vomiting   Milk Protein Other (See Comments)   Statins Other (See Comments)    Muscle aches in past on pravastatin, Crestor, Zocor, Lipitor - while in [place] Muscle aches in past on pravastatin, Crestor, Zocor, Lipitor - while in [place]     Current Outpatient Medications:    albuterol (VENTOLIN HFA) 108 (90 Base) MCG/ACT inhaler, Inhale 2 puffs into the lungs every 4 (four) hours as needed for wheezing or shortness of breath (cough)., Disp: 1 each, Rfl: 2   benzonatate (TESSALON) 100 MG capsule, Take 1 capsule (100 mg total) by mouth 3 (three) times daily as needed for cough., Disp: 30 capsule, Rfl: 0    buPROPion (WELLBUTRIN XL) 150 MG 24 hr tablet, Take 150 mg by mouth daily., Disp: , Rfl:    busPIRone (BUSPAR) 10 MG tablet, Take 10 mg by mouth 2 (two) times daily as needed for anxiety., Disp: , Rfl:    conjugated estrogens (PREMARIN) vaginal cream, Premarin 0.625 mg/gram vaginal cream  INSERT 0.5 G TWICE A WEEK BY VAGINAL ROUTE AS NEEDED FOR 30 DAYS., Disp: , Rfl:    doxycycline (VIBRAMYCIN) 100 MG capsule, Take 1 capsule (100 mg total) by mouth 2 (two) times daily., Disp: 20 capsule, Rfl: 0   DULoxetine (CYMBALTA) 60 MG capsule, Take 1 capsule (60 mg total) by mouth daily., Disp: 90 capsule, Rfl: 3   eletriptan (RELPAX) 40 MG tablet, Take 40 mg by mouth as needed for migraine or headache (USE AS DIRECTED). May repeat in 2 hours if headache persists or recurs., Disp: , Rfl:    estradiol (ESTRACE) 2 MG tablet, Take 2 mg by mouth daily. , Disp: , Rfl:    ezetimibe (ZETIA) 10 MG tablet, TAKE 1 TABLET BY MOUTH EVERY DAY **NEED APPT**, Disp: 90 tablet, Rfl: 3   fluticasone (FLONASE) 50 MCG/ACT nasal spray, Place 2 sprays into both nostrils daily as needed for allergies., Disp: , Rfl:    levothyroxine (SYNTHROID) 75 MCG tablet, Take 1 tablet (75 mcg total) by mouth daily., Disp: 30 tablet, Rfl: 3   methocarbamol (ROBAXIN) 500 MG tablet, Take 500 mg by mouth 3 times/day  as needed-between meals & bedtime for muscle spasms., Disp: , Rfl:    mirabegron ER (MYRBETRIQ) 25 MG TB24 tablet, Take 25 mg by mouth 3 times/day as needed-between meals & bedtime (for blader spasms)., Disp: , Rfl:    montelukast (SINGULAIR) 10 MG tablet, Take 1 tablet (10 mg total) by mouth at bedtime., Disp: 90 tablet, Rfl: 3   nirmatrelvir & ritonavir (PAXLOVID, 300/100,) 20 x 150 MG & 10 x 100MG TBPK, Take 3 tablets (2 tabs of nirmatrelvir and 1 tab of ritonavir) by mouth 2 (two) times daily., Disp: 30 tablet, Rfl: 0   Pitavastatin Calcium (LIVALO) 4 MG TABS, Take 1 tablet (4 mg total) by mouth daily., Disp: 90 tablet, Rfl: 3    progesterone (PROMETRIUM) 100 MG capsule, Take 100 mg by mouth daily. , Disp: , Rfl:    terbinafine (LAMISIL) 250 MG tablet, Take 250 mg by mouth daily., Disp: , Rfl:   Lab Monitoring: eGFR 100  Drug Interactions Noted: Eletriptan  Plan:  This patient is a 60 y.o. female that meets the criteria for Emergency Use Authorization of Paxlovid. After reviewing the emergency use authorization with the patient, the patient agrees to receive Paxlovid.  Through FDA guidance and current [place] standing order Paxlovid will be prescribed to the patient.   Patient contacted for counseling on 03/05/21 and verbalized understanding.   Delivery or Pick-Up Date: 03/05/21  Follow up instructions:    Take prescription BID x 5 days as directed Counseling was provided by pharmacist. Reach out to pharmacist with follow up questions For concerns regarding further COVID symptoms please follow up with your PCP or urgent care For urgent or life-threatening issues, seek care at your local emergency department   [place] 03/05/2021, 12:15 PM [place] Pharmacist Phone# [phone]

## 2021-03-17 ENCOUNTER — Ambulatory Visit
Admission: EM | Admit: 2021-03-17 | Discharge: 2021-03-17 | Disposition: A | Discharge status: Home / Self Care | Payer: Managed Care, Other (non HMO) | Attending: Internal Medicine | Admitting: Internal Medicine

## 2021-03-17 ENCOUNTER — Ambulatory Visit: Payer: Self-pay | Admitting: *Deleted

## 2021-03-17 ENCOUNTER — Other Ambulatory Visit: Payer: Self-pay

## 2021-03-17 ENCOUNTER — Ambulatory Visit (INDEPENDENT_AMBULATORY_CARE_PROVIDER_SITE_OTHER): Payer: Managed Care, Other (non HMO)

## 2021-03-17 DIAGNOSIS — B9689 Other specified bacterial agents as the cause of diseases classified elsewhere: Secondary | ICD-10-CM

## 2021-03-17 DIAGNOSIS — J069 Acute upper respiratory infection, unspecified: Secondary | ICD-10-CM

## 2021-03-17 DIAGNOSIS — J4521 Mild intermittent asthma with (acute) exacerbation: Secondary | ICD-10-CM

## 2021-03-17 MED ORDER — PREDNISONE 20 MG PO TABS: 20.0000 mg | ORAL_TABLET | Freq: Every day | ORAL | 0 refills | Status: AC

## 2021-03-17 MED ORDER — BENZONATATE 100 MG PO CAPS: 100.0000 mg | ORAL_CAPSULE | Freq: Three times a day (TID) | ORAL | 0 refills | Status: DC | PRN

## 2021-03-17 NOTE — ED Provider Notes (Signed)
[facility]    CSN: [id number] Arrival date & time: 03/17/21  1128      History   Chief Complaint Chief Complaint  Patient presents with   Shortness of Breath   Cough    HPI Christine Berger is a 60 y.o. female comes to urgent care with worsening shortness of breath, wheezing and chest tightness.  Patient recently recovered from COVID-19 infection initially diagnosed on 03/04/2021.  She denies any chest pain or chest pressure.  No fever or chills.  Patient is currently using albuterol inhaler with no significant improvement.  Cough is not productive of sputum.  No nausea, vomiting or diarrhea.  No history of tobacco use.   HPI  Past Medical History:  Diagnosis Date   Allergy    Anxiety    Asthma    Depression    Fibromyalgia    Glaucoma    Hyperlipidemia    Neuropathy    folloewd pcp   Osteoporosis    Sleep apnea    Thyroid disease     Patient Active Problem List   Diagnosis Date Noted   Extrinsic asthma 12/04/2020   Seasonal allergies 12/04/2020   Major depressive disorder, recurrent episode, in partial remission ([place]) 12/04/2020   Anxiety 12/04/2020   Fibromyalgia    Hyperlipidemia 07/23/2013    Past Surgical History:  Procedure Laterality Date   INCONTINENCE SURGERY  10/27/2011   bladder sling   uterine ablation  04/28/2010    OB History   No obstetric history on file.      Home Medications    Prior to Admission medications   Medication Sig Start Date End Date Taking? Authorizing Provider  albuterol (VENTOLIN HFA) 108 (90 Base) MCG/ACT inhaler Inhale 2 puffs into the lungs every 4 (four) hours as needed for wheezing or shortness of breath (cough). 12/04/20  Yes Karamalegos, Devonne Doughty, DO  buPROPion (WELLBUTRIN XL) 150 MG 24 hr tablet Take 150 mg by mouth daily. 11/18/20  Yes [provider]  busPIRone (BUSPAR) 10 MG tablet Take 10 mg by mouth 2 (two) times daily as needed for anxiety. 07/20/17  Yes [provider]   conjugated estrogens (PREMARIN) vaginal cream Premarin 0.625 mg/gram vaginal cream  INSERT 0.5 G TWICE A WEEK BY VAGINAL ROUTE AS NEEDED FOR 30 DAYS.   Yes [provider]  DULoxetine (CYMBALTA) 60 MG capsule Take 1 capsule (60 mg total) by mouth daily. 12/04/20  Yes Karamalegos, Devonne Doughty, DO  eletriptan (RELPAX) 40 MG tablet Take 40 mg by mouth as needed for migraine or headache (USE AS DIRECTED). May repeat in 2 hours if headache persists or recurs.   Yes [provider]  estradiol (ESTRACE) 2 MG tablet Take 2 mg by mouth daily.  04/16/16  Yes [provider]  ezetimibe (ZETIA) 10 MG tablet TAKE 1 TABLET BY MOUTH EVERY DAY **NEED APPT** 09/11/20  Yes Fay Records, MD  fluticasone [facility]) 50 MCG/ACT nasal spray Place 2 sprays into both nostrils daily as needed for allergies.   Yes [provider]  levothyroxine (SYNTHROID) 75 MCG tablet Take 1 tablet (75 mcg total) by mouth daily. 01/11/21  Yes Karamalegos, Devonne Doughty, DO  mirabegron ER (MYRBETRIQ) 25 MG TB24 tablet Take 25 mg by mouth 3 times/day as needed-between meals & bedtime (for blader spasms). 09/19/19  Yes [provider]  montelukast (SINGULAIR) 10 MG tablet Take 1 tablet (10 mg total) by mouth at bedtime. 12/04/20  Yes Olin Hauser, DO  nirmatrelvir & ritonavir (PAXLOVID, 300/100,) 20 x 150 MG & 10 x 100MG  TBPK Take 3 tablets (2 tabs of nirmatrelvir and 1 tab of ritonavir) by mouth 2 (two) times daily. 03/05/21  Yes 14/9/22, MD  Pitavastatin Calcium (LIVALO) 4 MG TABS Take 1 tablet (4 mg total) by mouth daily. 05/29/20  Yes 07/29/20, MD  predniSONE (DELTASONE) 20 MG tablet Take 1 tablet (20 mg total) by mouth daily for 5 days. 03/17/21 03/22/21 Yes Melika Reder, 03/24/21, MD  progesterone (PROMETRIUM) 100 MG capsule Take 100 mg by mouth daily.  04/16/16  Yes [provider]  terbinafine (LAMISIL) 250 MG tablet Take 250 mg by mouth daily. 02/19/20  Yes [provider]  benzonatate (TESSALON) 100 MG capsule Take 1 capsule (100 mg total) by mouth 3 (three) times daily as needed for cough. 03/17/21   Herberta Pickron, 03/19/21, MD  methocarbamol (ROBAXIN) 500 MG tablet Take 500 mg by mouth 3 times/day as needed-between meals & bedtime for muscle spasms. 03/14/20   [provider]    Family History Family History  Problem Relation Age of Onset   Stroke Mother    Diabetes Mother    Cancer Mother    Anuerysm Mother    Depression Mother    Heart attack Father    Arthritis Sister    Crohn's disease Sister    Breast cancer Neg Hx     Social History Social History   Tobacco Use   Smoking status: Never   Smokeless tobacco: Never  Vaping Use   Vaping Use: Never used  Substance Use Topics   Alcohol use: Yes    Alcohol/week: 1.0 - 2.0 standard drink    Types: 1 - 2 Glasses of wine per week   Drug use: No     Allergies   Aspartame, Penicillins, Erythromycin, Milk protein, and Statins   Review of Systems Review of Systems  HENT: Negative.    Respiratory:  Positive for cough, chest tightness and wheezing. Negative for shortness of breath.   Cardiovascular:  Negative for chest pain.  Gastrointestinal: Negative.   Genitourinary: Negative.     Physical Exam Triage Vital Signs ED Triage Vitals  Enc Vitals Group     BP 03/17/21 1153 134/73     Pulse Rate 03/17/21 1153 77     Resp 03/17/21 1153 18     Temp 03/17/21 1153 98.5 F (36.9 C)     Temp Source 03/17/21 1153 Oral     SpO2 03/17/21 1153 97 %     Weight --      Height --      Head Circumference --      Peak Flow --      Pain Score 03/17/21 1154 1     Pain Loc --      Pain Edu? --      Excl. in GC? --    No data found.  Updated Vital Signs BP 134/73 (BP Location: Right Arm)    Pulse 77    Temp 98.5 F (36.9 C) (Oral)    Resp 18    SpO2 97%   Visual Acuity Right Eye Distance:   Left Eye Distance:   Bilateral Distance:    Right Eye Near:   Left Eye Near:     Bilateral Near:     Physical Exam Vitals reviewed.  Constitutional:      General: She is not in acute distress.    Appearance: She is not  ill-appearing.  Cardiovascular:     Rate and Rhythm: Normal rate and regular rhythm.  Pulmonary:     Breath sounds: Decreased breath sounds present. No wheezing, rhonchi or rales.  Abdominal:     General: Bowel sounds are normal.     Palpations: Abdomen is soft.  Neurological:     Mental Status: She is alert.     UC Treatments / Results  Labs (all labs ordered are listed, but only abnormal results are displayed) Labs Reviewed - No data to display  EKG   Radiology DG Chest 2 View  Result Date: 03/17/2021 CLINICAL DATA:  Recent COVID diagnosis. EXAM: CHEST - 2 VIEW COMPARISON:  March of 2022. FINDINGS: Cardiomediastinal contours and hilar structures are normal. Lungs are clear. No sign of pleural effusion. On limited assessment there is no acute skeletal process. IMPRESSION: Normal chest. Electronically Signed   By: Zetta Bills M.D.   On: 03/17/2021 12:36    Procedures Procedures (including critical care time)  Medications Ordered in UC Medications - No data to display  Initial Impression / Assessment and Plan / UC Course  I have reviewed the triage vital signs and the nursing notes.  Pertinent labs & imaging results that were available during my care of the patient were reviewed by me and considered in my medical decision making (see chart for details).     1.  Mild intermittent asthma with acute exacerbation: Chest x-ray is negative for acute lung infiltrate Prednisone 20 mg orally daily for 5 days Continue albuterol inhaler use Tessalon Perles as needed for cough Return precautions given. Final Clinical Impressions(s) / UC Diagnoses   Final diagnoses:  Mild intermittent asthma with acute exacerbation     Discharge Instructions      Please continue to use your inhalers at home every 4-6 hours as needed Maintain  adequate hydration Take medications as prescribed Chest x-ray is negative for pneumonia If symptoms worsen please return to urgent care to be reevaluated.   ED Prescriptions     Medication Sig Dispense Auth. Provider   benzonatate (TESSALON) 100 MG capsule Take 1 capsule (100 mg total) by mouth 3 (three) times daily as needed for cough. 30 capsule Korissa Horsford, Myrene Galas, MD   predniSONE (DELTASONE) 20 MG tablet Take 1 tablet (20 mg total) by mouth daily for 5 days. 5 tablet Ceola Para, Myrene Galas, MD      PDMP not reviewed this encounter.   Chase Picket, MD 03/17/21 [phone]

## 2021-03-17 NOTE — ED Triage Notes (Signed)
Pt presents COVID+ on 12/08.  Symptoms mostly resolved.  Cough (dry) and SOB returned 12/19.  SOB at rest exacerbated by activity.  No acute distress noted during triage.

## 2021-03-17 NOTE — Telephone Encounter (Signed)
Summary: Covid Follow up   Pt calling stating that she tested positive for covid on 03/05/21. She states that she was feeling better after the medication, but that her symptoms started coming back x2-3 days. She states that she has chest congestion, sinus pressure, and productive cough. Please advise.      Reason for Disposition  MODERATE difficulty breathing (e.g., speaks in phrases, SOB even at rest, pulse 100-120)  Answer Assessment - Initial Assessment Questions 1. COVID-19 ONSET: "When did the symptoms of COVID-19 first start?"     12/8 2. DIAGNOSIS CONFIRMATION: "How were you diagnosed?" (e.g., COVID-19 oral or nasal viral test; COVID-19 antibody test; doctor visit)     12/9 3. MAIN SYMPTOM:  "What is your main concern or symptom right now?" (e.g., breathing difficulty, cough, fatigue. loss of smell)     Chest congestion 4. SYMPTOM ONSET: "When did the  chest congestion  start?"     yesterday 5. BETTER-SAME-WORSE: "Are you getting better, staying the same, or getting worse over the last 1 to 2 weeks?"     Worse- active COVID- fatigue- now heavy congestion 6. RECENT MEDICAL VISIT: "Have you been seen by a healthcare provider (doctor, NP, PA) for these persisting COVID-19 symptoms?" If Yes, ask: "When were you seen?" (e.g., date)     Patient was treated with antiviral-Paxlovid- no appointment 7. COUGH: "Do you have a cough?" If Yes, ask: "How bad is the cough?"       terrible 8. FEVER: "Do you have a fever?" If Yes, ask: "What is your temperature, how was it measured, and when did it start?"     no 9. BREATHING DIFFICULTY: "Are you having any trouble breathing?" If Yes, ask: "How bad is your breathing?" (e.g., mild, moderate, severe)    - MILD: No SOB at rest, mild SOB with walking, speaks normally in sentences, can lie down, no retractions, pulse < 100.    - MODERATE: SOB at rest, SOB with minimal exertion and prefers to sit, cannot lie down flat, speaks in phrases, mild retractions,  audible wheezing, pulse 100-120.    - SEVERE: Very SOB at rest, speaks in single words, struggling to breathe, sitting hunched forward, retractions, pulse > 120       Tightness, SOB-always- moderate 10. HIGH RISK DISEASE: "Do you have any chronic medical problems?" (e.g., asthma, heart or lung disease, weak immune system, obesity, etc.)       Asthma, high cholesterol 11. VACCINE: "Have you gotten the COVID-19 vaccine?" If Yes, ask: "Which one, how many shots, when did you get it?"       yes 12. BOOSTER: "Have you received your COVID-19 booster?" If Yes, ask: "Which one and when did you get it?"       Yes- last booster also- 01/2021 13. PREGNANCY: "Is there any chance you are pregnant?" "When was your last menstrual period?"       na 14. OTHER SYMPTOMS: "Do you have any other symptoms?"  (e.g., fatigue, headache, muscle pain, weakness)       Muscle pain 15. O2 SATURATION MONITOR:  "Do you use an oxygen saturation monitor (pulse oximeter) at home?" If Yes, ask "What is your reading (oxygen level) today?" "What is your usual oxygen saturation reading?" (e.g., 95%)       no  Protocols used: Coronavirus (COVID-19) Persisting Symptoms Follow-up Call-A-AH

## 2021-03-17 NOTE — Discharge Instructions (Addendum)
Please continue to use your inhalers at home every 4-6 hours as needed Maintain adequate hydration Take medications as prescribed Chest x-ray is negative for pneumonia If symptoms worsen please return to urgent care to be reevaluated.

## 2021-03-17 NOTE — Telephone Encounter (Signed)
°  Chief Complaint: + COVID follow up- new symptoms Symptoms: cough, chest congestion, SOB Frequency: 2-3 days Pertinent Negatives: Patient denies  Disposition: [x] ED /[] Urgent Care (no appt availability in office) / [] Appointment(In office/virtual)/ []  [facility]/ [] Home Care/ [] Refused Recommended Disposition  Additional Notes: New COVID symptoms after treatment- SOB- ED advised

## 2021-03-23 ENCOUNTER — Ambulatory Visit: Payer: Managed Care, Other (non HMO) | Admitting: Internal Medicine

## 2021-03-23 ENCOUNTER — Other Ambulatory Visit: Payer: Self-pay

## 2021-03-23 ENCOUNTER — Encounter: Payer: Self-pay | Admitting: Internal Medicine

## 2021-03-23 VITALS — BP 113/47 | HR 73 | Temp 97.9°F | Resp 18 | Ht 63.0 in | Wt 169.6 lb

## 2021-03-23 DIAGNOSIS — J0110 Acute frontal sinusitis, unspecified: Secondary | ICD-10-CM | POA: Diagnosis not present

## 2021-03-23 MED ORDER — DOXYCYCLINE HYCLATE 100 MG PO TABS: 100.0000 mg | ORAL_TABLET | Freq: Two times a day (BID) | ORAL | 0 refills | Status: DC

## 2021-03-23 NOTE — Patient Instructions (Signed)

## 2021-03-23 NOTE — Progress Notes (Signed)
HPI  Pt presents to the clinic today for ER follow up. She went to the [facility] 12/21 with URI symptoms. Chest xray was normal. She was diagnosed with asthma exacerbation, treated with Prednisone and Tessalon. She is taking her Singulair as prescribed and Albuterol as needed. There are no PFT's on file.  Since discharge, she reports headache, nasal congestion, ear fullness, facial pain and pressure, scratchy throat and cough. She is blowing yellow mucous out of her nose. The cough is mostly nonproductive. She denies fever, chills or body aches. She has tried Mucinex and Sudafed with minimal relief. She reports she did test positive for COVID on 12/9.   Review of Systems     Past Medical History:  Diagnosis Date   Allergy    Anxiety    Asthma    Depression    Fibromyalgia    Glaucoma    Hyperlipidemia    Neuropathy    folloewd pcp   Osteoporosis    Sleep apnea    Thyroid disease     Family History  Problem Relation Age of Onset   Stroke Mother    Diabetes Mother    Cancer Mother    Anuerysm Mother    Depression Mother    Heart attack Father    Arthritis Sister    Crohn's disease Sister    Breast cancer Neg Hx     Social History   Socioeconomic History   Marital status: Married    Spouse name: Not on file   Number of children: Not on file   Years of education: Not on file   Highest education level: Not on file  Occupational History   Not on file  Tobacco Use   Smoking status: Never   Smokeless tobacco: Never  Vaping Use   Vaping Use: Never used  Substance and Sexual Activity   Alcohol use: Yes    Alcohol/week: 1.0 - 2.0 standard drink    Types: 1 - 2 Glasses of wine per week   Drug use: No   Sexual activity: Not on file  Other Topics Concern   Not on file  Social History Narrative   Not on file   Social Determinants of Health   Financial Resource Strain: Not on file  Food Insecurity: Not on file  Transportation Needs: Not on file  Physical Activity: Not on  file  Stress: Not on file  Social Connections: Not on file  Intimate Partner Violence: Not on file    Allergies  Allergen Reactions   Aspartame Cough and Shortness Of Breath   Penicillins     Hives and tightness in chest   Erythromycin Nausea And Vomiting   Milk Protein Other (See Comments)   Statins Other (See Comments)    Muscle aches in past on pravastatin, Crestor, Zocor, Lipitor - while in [place] Muscle aches in past on pravastatin, Crestor, Zocor, Lipitor - while in [place]     Constitutional: Positive headache, fatigue. Denies fever, abrupt weight changes.  HEENT:  Positive facial pain, ear fullness, nasal congestion and scratchythroat. Denies eye redness, ear pain, ringing in the ears, wax buildup, runny nose or bloody nose. Respiratory: Positive cough. Denies difficulty breathing or shortness of breath.  Cardiovascular: Denies chest pain, chest tightness, palpitations or swelling in the hands or feet.   No other specific complaints in a complete review of systems (except as listed in HPI above).  Objective:   BP (!) 113/47 (BP Location: Right Arm, Patient Position: Sitting, Cuff Size: Normal)  Pulse 73    Temp 97.9 F (36.6 C) (Temporal)    Resp 18    Ht 5\' 3"  (1.6 m)    Wt 169 lb 9.6 oz (76.9 kg)    SpO2 98%    BMI 30.04 kg/m    General: Appears her stated age, appears unwell but in NAD. HEENT: Head: normal shape and size, frontal sinus tenderness noted;  Throat/Mouth: + PND. Teeth present, mucosa erythematous and moist, no exudate noted, no lesions or ulcerations noted.  Neck:  No adenopathy noted.  Cardiovascular: Normal rate and rhythm. S1,S2 noted.  No murmur, rubs or gallops noted.  Pulmonary/Chest: Normal effort and positive vesicular breath sounds. No respiratory distress. No wheezes, rales or ronchi noted.       Assessment & Plan:   UC Follow Up for Asthma Exacerbation:  UC notes and imaging reviewed Can use a Neti Pot which can be purchased from your  local drug store. Flonase 2 sprays each nostril for 3 days and then as needed. Rx for Doxycycline 100 mg twice daily x10 days   RTC as needed or if symptoms persist. Webb Silversmith, NP This visit occurred during the SARS-CoV-2 public health emergency.  Safety protocols were in place, including screening questions prior to the visit, additional usage of staff PPE, and extensive cleaning of exam room while observing appropriate contact time as indicated for disinfecting solutions.

## 2021-04-05 ENCOUNTER — Encounter: Payer: Self-pay | Admitting: Family Medicine

## 2021-04-05 ENCOUNTER — Other Ambulatory Visit: Payer: Self-pay

## 2021-04-05 ENCOUNTER — Other Ambulatory Visit: Payer: Self-pay | Admitting: Family Medicine

## 2021-04-05 ENCOUNTER — Ambulatory Visit: Payer: Managed Care, Other (non HMO) | Admitting: Family Medicine

## 2021-04-05 VITALS — BP 128/55 | HR 67 | Ht 63.0 in | Wt 171.8 lb

## 2021-04-05 DIAGNOSIS — J4520 Mild intermittent asthma, uncomplicated: Secondary | ICD-10-CM | POA: Diagnosis not present

## 2021-04-05 DIAGNOSIS — K219 Gastro-esophageal reflux disease without esophagitis: Secondary | ICD-10-CM | POA: Diagnosis not present

## 2021-04-05 MED ORDER — OMEPRAZOLE 40 MG PO CPDR: 40.0000 mg | DELAYED_RELEASE_CAPSULE | Freq: Every day | ORAL | 3 refills | Status: DC

## 2021-04-05 NOTE — Patient Instructions (Addendum)
Thank you for coming to the office today.  Your symptoms sound most consistent with Silent Reflux or (Laryngopharyngeal Reflux), this is similar to Acid Reflux (or GERD) but usually involves the Throat and has a variety of symptoms including a "globus sensation", or air bubble or pressure in throat.   Commonly occurs in patients who have had some symptoms of traditional heartburn before, but this can occur even when not eating spicy foods. - Start Omeprazole 40mg  - Take one capsule 30 min before first meal of day, same time every day for at least 8-12 weeks - Future caution with REBOUND effect, can use 20mg  OTC daily then every other then every 3rd day if you are discontinuing the med.  - Avoid spicy, greasy, coffee, fried foods, also things like caffeine, dark chocolate, peppermint can worsen - Avoid large meals and late night snacks, also do not go more than 4-5 hours without a snack or meal (not eating will worsen reflux symptoms due to stomach acid)  If the problem improves but keeps coming back, we can discuss higher dose or longer course at next visit.  If symptoms are worsening, persistent symptoms despite treatment or develop esophageal or abdominal pain, unable to swallow solids or liquids, nausea, vomiting, fever/chills, or unintentional weight loss / no appetite, please follow-up sooner or seek more immediate medical attention.  PLAN B if not improving message me and we can order an Asthma Maintenance - we can order any of the following:  Advair (or generic) Symbicort Breo Dulera Flovent  --------------------------------------------------  Please schedule a Follow-up Appointment to: Return in about 3 months (around 07/04/2021) for 3 months Annual Physical  AM fasting lab AFTER.  If you have any other questions or concerns, please feel free to call the office or send a message through MyChart. You may also schedule an earlier appointment if necessary.  Additionally, you may be  receiving a survey about your experience at our office within a few days to 1 week by e-mail or mail. We value your feedback.  Saralyn PilarAlexander Angellee Cohill, DO [facility], [facility]  Food Choices for Gastroesophageal Reflux Disease, Adult When you have gastroesophageal reflux disease (GERD), the foods you eat and your eating habits are very important. Choosing the right foods can help ease the discomfort of GERD. Consider working with a dietitian to help you make healthy food choices. What are tips for following this plan? Reading food labels Look for foods that are low in saturated fat. Foods that have less than 5% of daily value (DV) of fat and 0 g of trans fats may help with your symptoms. Cooking Cook foods using methods other than frying. This may include baking, steaming, grilling, or broiling. These are all methods that do not need a lot of fat for cooking. To add flavor, try to use herbs that are low in spice and acidity. Meal planning  Choose healthy foods that are low in fat, such as fruits, vegetables, whole grains, low-fat dairy products, lean meats, fish, and poultry. Eat frequent, small meals instead of three large meals each day. Eat your meals slowly, in a relaxed setting. Avoid bending over or lying down until 2-3 hours after eating. Limit high-fat foods such as fatty meats or fried foods. Limit your intake of fatty foods, such as oils, butter, and shortening. Avoid the following as told by your health care provider: Foods that cause symptoms. These may be different for different people. Keep a food diary to keep track of foods  that cause symptoms. Alcohol. Drinking large amounts of liquid with meals. Eating meals during the 2-3 hours before bed. Lifestyle Maintain a healthy weight. Ask your health care provider what weight is healthy for you. If you need to lose weight, work with your health care provider to do so safely. Exercise for at least 30 minutes on 5 or more  days each week, or as told by your health care provider. Avoid wearing clothes that fit tightly around your waist and chest. Do not use any products that contain nicotine or tobacco. These products include cigarettes, chewing tobacco, and vaping devices, such as e-cigarettes. If you need help quitting, ask your health care provider. Sleep with the head of your bed raised. Use a wedge under the mattress or blocks under the bed frame to raise the head of the bed. Chew sugar-free gum after mealtimes. What foods should I eat? Eat a healthy, well-balanced diet of fruits, vegetables, whole grains, low-fat dairy products, lean meats, fish, and poultry. Each person is different. Foods that may trigger symptoms in one person may not trigger any symptoms in another person. Work with your health care provider to identify foods that are safe for you. The items listed above may not be a complete list of recommended foods and beverages. Contact a dietitian for more information. What foods should I avoid? Limiting some of these foods may help manage the symptoms of GERD. Everyone is different. Consult a dietitian or your health care provider to help you identify the exact foods to avoid, if any. Fruits Any fruits prepared with added fat. Any fruits that cause symptoms. For some people this may include citrus fruits, such as oranges, grapefruit, pineapple, and lemons. Vegetables Deep-fried vegetables. [place] fries. Any vegetables prepared with added fat. Any vegetables that cause symptoms. For some people, this may include tomatoes and tomato products, chili peppers, onions and garlic, and horseradish. Grains Pastries or quick breads with added fat. Meats and other proteins High-fat meats, such as fatty beef or pork, hot dogs, ribs, ham, sausage, salami, and bacon. Fried meat or protein, including fried fish and fried chicken. Nuts and nut butters, in large amounts. Dairy Whole milk and chocolate milk. Sour  cream. Cream. Ice cream. Cream cheese. Milkshakes. Fats and oils Butter. Margarine. Shortening. Ghee. Beverages Coffee and tea, with or without caffeine. Carbonated beverages. Sodas. Energy drinks. Fruit juice made with acidic fruits, such as orange or grapefruit. Tomato juice. Alcoholic drinks. Sweets and desserts Chocolate and cocoa. Donuts. Seasonings and condiments Pepper. Peppermint and spearmint. Added salt. Any condiments, herbs, or seasonings that cause symptoms. For some people, this may include curry, hot sauce, or vinegar-based salad dressings. The items listed above may not be a complete list of foods and beverages to avoid. Contact a dietitian for more information. Questions to ask your health care provider Diet and lifestyle changes are usually the first steps that are taken to manage symptoms of GERD. If diet and lifestyle changes do not improve your symptoms, talk with your health care provider about taking medicines. Where to find more information International Foundation for Gastrointestinal Disorders: aboutgerd.org Summary When you have gastroesophageal reflux disease (GERD), food and lifestyle choices may be very helpful in easing the discomfort of GERD. Eat frequent, small meals instead of three large meals each day. Eat your meals slowly, in a relaxed setting. Avoid bending over or lying down until 2-3 hours after eating. Limit high-fat foods such as fatty meats or fried foods. This information is not intended  to replace advice given to you by your health care provider. Make sure you discuss any questions you have with your health care provider. Document Revised: 09/23/2019 Document Reviewed: 09/23/2019 Elsevier Patient Education  2022 [employer].

## 2021-04-05 NOTE — Progress Notes (Signed)
Subjective:    Patient ID: Christine Berger, female    DOB: March 31, 1960, 61 y.o.   MRN: [id number]  Christine Berger is a 61 y.o. female presenting on 04/05/2021 for Gastroesophageal Reflux and Asthma   HPI  Asthma Silent Reflux/GERD - Last visit with me 12/04/20, for initial visit for same problem asthma/reflux, treated with Singulair and Albuterol PRN, see prior notes for background information. - Interval update with Singulair is helpful but not resolving her wheezing, also discussed GERD, she has history of silent reflux, has had EGD few years prior, was on PPI intermittently and has not taken PPI regularly or higher doses still not endorsing heartburn. - Today patient reports doing some better but still has persistent "increased acid" build up in throat and feels a lump or globus sensation often worse at night. - Still has wheezing, she can hear, worse in AM and worse in PM - Admits using the Albuterol inhaler 2-3 times a day PRN, not much relief with taking Albuterol PRN. Questionable results. Not on maintenance therapy   Depression screen [facility] 2/9 04/05/2021 12/04/2020 06/08/2016  Decreased Interest 0 1 0  Down, Depressed, Hopeless 1 0 0  PHQ - 2 Score 1 1 0  Altered sleeping 1 0 -  Tired, decreased energy 3 2 -  Change in appetite 2 1 -  Feeling bad or failure about yourself  1 0 -  Trouble concentrating 1 2 -  Moving slowly or fidgety/restless 0 0 -  Suicidal thoughts 0 0 -  PHQ-9 Score 9 6 -  Difficult doing work/chores Somewhat difficult Not difficult at all -    Social History   Tobacco Use   Smoking status: Never   Smokeless tobacco: Never  Vaping Use   Vaping Use: Never used  Substance Use Topics   Alcohol use: Yes    Alcohol/week: 1.0 - 2.0 standard drink    Types: 1 - 2 Glasses of wine per week    Comment: monthly   Drug use: No    Review of Systems Per HPI unless specifically indicated above     Objective:    BP (!) 128/55    Pulse 67    Ht 5\' 3"  (1.6  m)    Wt 171 lb 12.8 oz (77.9 kg)    SpO2 98%    BMI 30.43 kg/m   Wt Readings from Last 3 Encounters:  04/05/21 171 lb 12.8 oz (77.9 kg)  03/23/21 169 lb 9.6 oz (76.9 kg)  12/04/20 169 lb 9.6 oz (76.9 kg)    Physical Exam Vitals and nursing note reviewed.  Constitutional:      General: She is not in acute distress.    Appearance: Normal appearance. She is well-developed. She is not diaphoretic.     Comments: Well-appearing, comfortable, cooperative  HENT:     Head: Normocephalic and atraumatic.  Eyes:     General:        Right eye: No discharge.        Left eye: No discharge.     Conjunctiva/sclera: Conjunctivae normal.  Cardiovascular:     Rate and Rhythm: Normal rate.  Pulmonary:     Effort: Pulmonary effort is normal. No respiratory distress.     Breath sounds: Normal breath sounds. No stridor. No wheezing, rhonchi or rales.  Skin:    General: Skin is warm and dry.     Findings: No erythema or rash.  Neurological:     Mental Status: She is alert  and oriented to person, place, and time.  Psychiatric:        Mood and Affect: Mood normal.        Behavior: Behavior normal.        Thought Content: Thought content normal.     Comments: Well groomed, good eye contact, normal speech and thoughts   Results for orders placed or performed in visit on 01/08/21  HM MAMMOGRAPHY  Result Value Ref Range   HM Mammogram 0-4 Bi-Rad 0-4 Bi-Rad, Self Reported Normal  HM PAP SMEAR  Result Value Ref Range   HM Pap smear negative for intraepithelial lesions or malignancy       Assessment & Plan:   Problem List Items Addressed This Visit     Laryngopharyngeal reflux (LPR) - Primary   Relevant Medications   omeprazole (PRILOSEC) 40 MG capsule   Extrinsic asthma    Suspected silent reflux with laryngopharyngeal reflux symptoms now with globus sensation and sensation in throat. No significant oropharyngeal risk factors (non smoker, no limited alcohol). - No GI red flag symptoms -  Inadequately treated GERD but not having heartburn. Only intermittent PPI Some triggers coffee daily, peppermint etc   Plan: 1. Start rx Omeprazole 40mg  daily before breakfast 2-3 months, advised on dosing, rebound taper, duration 2. Diet modifications reduce GERD, handout AVS, elevated head of bed, already on wedge pillow to sleep May require BID dosing in future  Ultimately suspect the GERD/LPR is causing her breathing wheezing symptoms, triggering asthma. Note not responding to Albuterol as much and Singulair  Keep SIngulair Limit PRN Albuterol Notify office 2-4 weeks if limited success on PPI we will order daily maintenance inhaler for asthma, gave her list and we can choose appropriate therapy.     Meds ordered this encounter  Medications   omeprazole (PRILOSEC) 40 MG capsule    Sig: Take 1 capsule (40 mg total) by mouth daily before breakfast.    Dispense:  30 capsule    Refill:  3      Follow up plan: Return in about 3 months (around 07/04/2021) for 3 months Annual Physical  AM fasting lab AFTER.  She will need Thyroid labs added in to yearly  09/03/2021, DO [facility] 04/05/2021, 3:51 PM

## 2021-04-05 NOTE — Telephone Encounter (Signed)
Requested Prescriptions  Pending Prescriptions Disp Refills   albuterol (VENTOLIN HFA) 108 (90 Base) MCG/ACT inhaler [Pharmacy Med Name: ALBUTEROL HFA (PROVENTIL) INH] 6.7 each 2    Sig: INHALE 2 PUFFS INTO THE LUNGS EVERY 4 HOURS AS NEEDED FOR WHEEZING, SHORTNESS OF BREATH, OR COUGH     Pulmonology:  Beta Agonists Failed - 04/05/2021  1:39 AM      Failed - One inhaler should last at least one month. If the patient is requesting refills earlier, contact the patient to check for uncontrolled symptoms.      Passed - Valid encounter within last 12 months    Recent Outpatient Visits          1 week ago Acute non-recurrent frontal sinusitis   [facility], Salvadore Oxford, NP   4 months ago Fibromyalgia   [facility], Netta Neat, DO      Future Appointments            Today Althea Charon, Netta Neat, DO [facility], [facility] [address]

## 2021-04-08 ENCOUNTER — Other Ambulatory Visit: Payer: Self-pay | Admitting: Family Medicine

## 2021-04-08 NOTE — Telephone Encounter (Signed)
Requested medications are due for refill today.  yes  Requested medications are on the active medications list.  yes  Last refill. 01/11/2021  Future visit scheduled.   no  Notes to clinic.  Failed protocol d/t expired labs.    Requested Prescriptions  Pending Prescriptions Disp Refills   levothyroxine (SYNTHROID) 75 MCG tablet [Pharmacy Med Name: LEVOTHYROXINE 75 MCG TABLET] 90 tablet 1    Sig: TAKE 1 TABLET BY MOUTH EVERY DAY     Endocrinology:  Hypothyroid Agents Failed - 04/08/2021  1:39 AM      Failed - TSH needs to be rechecked within 3 months after an abnormal result. Refill until TSH is due.      Failed - TSH in normal range and within 360 days    TSH  Date Value Ref Range Status  11/14/2018 3.310 0.450 - 4.500 uIU/mL Final          Passed - Valid encounter within last 12 months    Recent Outpatient Visits           3 days ago Laryngopharyngeal reflux (LPR)   [facility] Smitty Cords, DO   2 weeks ago Acute non-recurrent frontal sinusitis   [facility] [place], Salvadore Oxford, NP   4 months ago Fibromyalgia   [facility], Netta Neat, [place]

## 2021-04-14 ENCOUNTER — Encounter: Payer: Self-pay | Admitting: Family Medicine

## 2021-04-14 NOTE — Telephone Encounter (Signed)
Please advised for continued symptoms.  Started on omeprazole 04/05/2021.

## 2021-05-11 ENCOUNTER — Encounter: Payer: Self-pay | Admitting: Family Medicine

## 2021-05-20 ENCOUNTER — Encounter: Payer: Self-pay | Admitting: Family Medicine

## 2021-05-20 DIAGNOSIS — F3341 Major depressive disorder, recurrent, in partial remission: Secondary | ICD-10-CM

## 2021-05-20 MED ORDER — BUPROPION HCL ER (XL) 150 MG PO TB24: 150.0000 mg | ORAL_TABLET | Freq: Every day | ORAL | 1 refills | Status: DC

## 2021-06-24 ENCOUNTER — Encounter: Payer: Self-pay | Admitting: Cardiology

## 2021-06-24 ENCOUNTER — Ambulatory Visit: Payer: Managed Care, Other (non HMO) | Admitting: Cardiology

## 2021-06-24 VITALS — BP 136/78 | HR 71 | Ht 63.0 in | Wt 169.0 lb

## 2021-06-24 DIAGNOSIS — J302 Other seasonal allergic rhinitis: Secondary | ICD-10-CM

## 2021-06-24 DIAGNOSIS — E782 Mixed hyperlipidemia: Secondary | ICD-10-CM

## 2021-06-24 DIAGNOSIS — I700 Atherosclerosis of aorta: Secondary | ICD-10-CM

## 2021-06-24 DIAGNOSIS — Z8249 Family history of ischemic heart disease and other diseases of the circulatory system: Secondary | ICD-10-CM | POA: Diagnosis not present

## 2021-06-24 DIAGNOSIS — G4733 Obstructive sleep apnea (adult) (pediatric): Secondary | ICD-10-CM | POA: Diagnosis not present

## 2021-06-24 DIAGNOSIS — Z9989 Dependence on other enabling machines and devices: Secondary | ICD-10-CM

## 2021-06-24 HISTORY — DX: Family history of ischemic heart disease and other diseases of the circulatory system: Z82.49

## 2021-06-24 NOTE — Progress Notes (Addendum)
? ? ?Primary Care Provider: Smitty Cords, DO ?[facility] Cardiologist: Dietrich Pates, MD - Now moved to [facility] (transferring care) ?Electrophysiologist: None ? ?Clinic Note: ?Chief Complaint  ?Patient presents with  ? Follow-up  ?  1 year follow up  Doing well; ?Previously seen by Dietrich Pates, MD  ? ?=================================== ? ?ASSESSMENT/PLAN  ? ?Problem List Items Addressed This Visit   ? ?  ? Cardiology Problems  ? Mixed hyperlipidemia (Chronic)  ?  She is due to get her labs checked by Dr. Cheron Schaumann in April. ? ?It looks like lipids been pretty well controlled on combination of Zetia and Livalo. ? ?We talked about her diet and try to increase exercise. ?Reassuring factor is her Coronary Calcium Score being 0 last year.  This means she is somewhat reduced risk despite her family history. ? ?For now we will continue Zetia and Livalo and will look for labs from PCP. ? ? ?  ?  ? Thoracic aortic atherosclerosis (HCC) (Chronic)  ?  Seen on Coronary Calcium Score.  Not unexpected given her age and atherosclerotic range hyperlipidemia. ? ?Continue lifestyle modification and lipid-lowering plan with LDL target less than 100. ? ?Continue to monitor blood pressure which seems to be pretty well controlled. ? ?  ?  ?  ? Other  ? Seasonal allergies (Chronic)  ?  Exacerbating her baseline asthma ? ?I recommend that she discuss this with her PCP. ? ?  ?  ? Family history of premature CAD - Primary (Chronic)  ?  Thankfully, her Coronary Calcium Score last year was essentially 0 which puts her at pretty low risk.  Additionally, her echocardiogram was essentially normal.  She does however have a family history of CAD. ? ?I think we would still do target LDL less than 100 based on her family history, but would just Coronary Calcium Score level would not recommend aspirin for prophylactic purposes. ? ?  ?  ? Relevant Orders  ? EKG 12-Lead (Completed)  ? OSA on CPAP (Chronic)  ?  She seems to be doing  well with CPAP. ? ?  ?  ? ? ?=================================== ? ?HPI:   ? ?Christine Berger is a 61 y.o. female non-smoker with a history of HTN and HLD as well as OSA on CPAP who is being seen today for the transfer cardiology care to [facility]  at the request of Saralyn Pilar *.  She was previously seen by Dr. Dietrich Pates at [facility], but recently moved to [place]. ? ?Christine Berger was last seen on May 19, 2020 for 2-year follow-up.  She denies any chest pain or dyspnea.  No palpitations.  Some lower extremity edema-for which she was wearing support stockings.  Exam benign.  2D echo ordered along Coronary Calcium Score.  She was taking 4 mg of Livalo along with Zetia. ? ?Recent Hospitalizations:  ?Urgent Care visit 03/17/2021-intermittent asthma.  Had just recovered from COVID diagnosed 03/04/2021.  She noted cough, chest tightness and wheezing.  Given Tessalon Perles and prednisone PulsePak. ? ?Reviewed  CV studies:   ? ?The following studies were reviewed today: (if available, images/films reviewed: From Epic Chart or Care Everywhere) ?Coronary Calcium Score 05/26/2020: Calcium score 0.  Aortic atherosclerosis noted. ?TTE 06/04/2020: EF 55 to 60%.  No RWMA.  GR 1 DD with mild LA dilation.  Normal mitral and aortic valves.  Normal RV size and function.  Normal RVP and RAP.  (Normal) ? ?  Interval History:  ? ?Christine Berger presents to establish new cardiology care in our [place] office.  She has been doing fairly well overall from a cardiac standpoint.  She is due for having labs checked next month and the results in an addendum to this note. ? ?Has not been very active with exercise, mostly noted that she is very tired when she gets up to walk.  As such, it is hard for her to tell if she is having any significant exertional symptoms.  However with the level of activity that she is doing, she denies any chest pain/pressure or dyspnea  with rest or exertion. ? ?For the most part the major issue she has been having is her longstanding asthma issues and potential cough from reflux/GERD.  She does have a cough and some wheezing in the morning and a bitter taste in the morning.  She may get some chest tightness when she is wheezing for which she has been using her albuterol inhaler couple times a day now. ? ? ?She denies any PND or orthopnea, however she does have some wheezing when she lies down at night as well.  When she saw Dr. Tenny Crawoss last year, she was have some issues with some edema.  Has not required diuretic, but does use support socks on occasion.Marland Kitchen. ?No tachycardia symptoms. ? ?REVIEWED OF SYSTEMS  ? ?Review of Systems  ?Constitutional:  Negative for fever, malaise/fatigue and weight loss.  ?HENT:  Positive for congestion. Negative for sinus pain.   ?Respiratory:  Positive for cough, shortness of breath and wheezing.   ?     She is not sure if this is asthma or allergies  ?Cardiovascular:   ?     Per HPI  ?Gastrointestinal:  Negative for blood in stool and melena.  ?Genitourinary:  Negative for hematuria.  ?Musculoskeletal:  Negative for falls and joint pain.  ?Neurological:  Positive for headaches. Negative for dizziness.  ?Psychiatric/Behavioral: Negative.    ? ?I have reviewed and (if needed) personally updated the patient's problem list, medications, allergies, past medical and surgical history, social and family history.  ? ?PAST MEDICAL HISTORY  ? ?Past Medical History:  ?Diagnosis Date  ? Allergy   ? Anxiety   ? Asthma   ? Depression   ? Family history of premature CAD 06/24/2021  ? Coronary Calcium Score 05/26/2020: Calcium score 0.  Aortic atherosclerosis noted.  ? Fibromyalgia   ? Glaucoma   ? Hyperlipidemia   ? Neuropathy   ? folloewd pcp  ? Osteoporosis   ? Sleep apnea   ? On CPAP  ? Thyroid disease   ? ? ?PAST SURGICAL HISTORY  ? ?Past Surgical History:  ?Procedure Laterality Date  ? INCONTINENCE SURGERY  10/27/2011  ? bladder sling   ? TRANSTHORACIC ECHOCARDIOGRAM  06/04/2020  ? EF 55 to 60%.  No RWMA.  GR 1 DD with mild LA dilation.  Normal mitral and aortic valves.  Normal RV size and function.  Normal RVP and RAP.  (Normal)  ? uterine ablation  04/28/2010  ? ? ?Immunization History  ?Administered Date(s) Administered  ? Influenza Inj Mdck Quad Pf 12/29/2018, 12/29/2018  ? Influenza, High Dose Seasonal PF 01/07/2020  ? Influenza-Unspecified 01/29/2021  ? PFIZER Comirnaty(Gray Top)Covid-19 Tri-Sucrose Vaccine 06/03/2019, 06/25/2019, 02/01/2020, 08/25/2020  ? Tdap 02/25/2013  ? Unspecified SARS-COV-2 Vaccination 06/03/2019, 06/25/2019, 02/01/2020  ? Zoster Recombinat (Shingrix) 09/19/2019, 12/20/2019  ? ? ?MEDICATIONS/ALLERGIES  ? ?Current Meds  ?Medication Sig  ? albuterol (VENTOLIN HFA)  108 (90 Base) MCG/ACT inhaler INHALE 2 PUFFS INTO THE LUNGS EVERY 4 HOURS AS NEEDED FOR WHEEZING, SHORTNESS OF BREATH, OR COUGH  ? buPROPion (WELLBUTRIN XL) 150 MG 24 hr tablet Take 1 tablet (150 mg total) by mouth daily.  ? busPIRone (BUSPAR) 10 MG tablet Take 10 mg by mouth 2 (two) times daily as needed for anxiety.  ? conjugated estrogens (PREMARIN) vaginal cream Premarin 0.625 mg/gram vaginal cream ? INSERT 0.5 G TWICE A WEEK BY VAGINAL ROUTE AS NEEDED FOR 30 DAYS.  ? DULoxetine (CYMBALTA) 60 MG capsule Take 1 capsule (60 mg total) by mouth daily.  ? eletriptan (RELPAX) 40 MG tablet Take 40 mg by mouth as needed for migraine or headache (USE AS DIRECTED). May repeat in 2 hours if headache persists or recurs.  ? estradiol (ESTRACE) 2 MG tablet Take 2 mg by mouth daily.   ? ezetimibe (ZETIA) 10 MG tablet TAKE 1 TABLET BY MOUTH EVERY DAY **NEED APPT**  ? fluticasone (FLONASE) 50 MCG/ACT nasal spray Place 2 sprays into both nostrils daily as needed for allergies.  ? levothyroxine (SYNTHROID) 75 MCG tablet TAKE 1 TABLET BY MOUTH EVERY DAY  ? methocarbamol (ROBAXIN) 500 MG tablet Take 500 mg by mouth 3 times/day as needed-between meals & bedtime for muscle  spasms.  ? metroNIDAZOLE (METROCREAM) 0.75 % cream Apply 1 application topically daily.  ? mirabegron ER (MYRBETRIQ) 25 MG TB24 tablet Take 25 mg by mouth as needed.  ? montelukast (SINGULAIR) 10 MG tablet Take

## 2021-06-24 NOTE — Patient Instructions (Addendum)
Medication Instructions:  ?- Your physician recommends that you continue on your current medications as directed. Please refer to the Current Medication list given to you today. ? ?*If you need a refill on your cardiac medications before your next appointment, please call your pharmacy* ? ? ?Lab Work: ?- none ordered ? ?If you have labs (blood work) drawn today and your tests are completely normal, you will receive your results only by: ?MyChart Message (if you have MyChart) OR ?A paper copy in the mail ?If you have any lab test that is abnormal or we need to change your treatment, we will call you to review the results. ? ? ?Testing/Procedures: ?- none ordered ? ? ?Follow-Up: ?At [facility], you and your health needs are our priority.  As part of our continuing mission to provide you with exceptional heart care, we have created designated Provider Care Teams.  These Care Teams include your primary Cardiologist (physician) and Advanced Practice Providers (APPs -  Physician Assistants and Nurse Practitioners) who all work together to provide you with the care you need, when you need it. ? ?We recommend signing up for the patient portal called "MyChart".  Sign up information is provided on this After Visit Summary.  MyChart is used to connect with patients for Virtual Visits (Telemedicine).  Patients are able to view lab/test results, encounter notes, upcoming appointments, etc.  Non-urgent messages can be sent to your provider as well.   ?To learn more about what you can do with MyChart, go to [email or web address].   ? ?Your next appointment:   ?1 year(s) ? ?The format for your next appointment:   ?In Person ? ?Provider:   ?You may see Bryan Lemma, MD or one of the following Advanced Practice Providers on your designated Care Team:   ?Nicolasa Ducking, NP ?Eula Listen, PA-C ?Cadence Fransico Michael, PA-C  ? ? ?Other Instructions ? ?- Please try to take your blood pressure a few times before you see your Primary Care  Doctor and take these readings with you to your visit.  ? ?

## 2021-07-01 ENCOUNTER — Other Ambulatory Visit: Payer: Self-pay | Admitting: Family Medicine

## 2021-07-01 DIAGNOSIS — K219 Gastro-esophageal reflux disease without esophagitis: Secondary | ICD-10-CM

## 2021-07-01 NOTE — Telephone Encounter (Signed)
last RF 04/05/21#30 3 RF due RF 08/03/21 ?Too soon ? ?Requested Prescriptions  ?Refused Prescriptions Disp Refills  ?? omeprazole (PRILOSEC) 40 MG capsule [Pharmacy Med Name: OMEPRAZOLE DR 40 MG CAPSULE] 90 capsule 1  ?  Sig: TAKE 1 CAPSULE BY MOUTH EVERY DAY BEFORE BREAKFAST  ?  ? Gastroenterology: Proton Pump Inhibitors Passed - 07/01/2021  2:23 AM  ?  ?  Passed - Valid encounter within last 12 months  ?  Recent Outpatient Visits   ?      ? 2 months ago Laryngopharyngeal reflux (LPR)  ? [facility] [place], Netta Neat, DO  ? 3 months ago Acute non-recurrent frontal sinusitis  ? [facility] [place], [place], NP  ? 6 months ago Fibromyalgia  ? [facility] Smitty Cords, DO  ?  ?  ?Future Appointments   ?        ? In 1 week Althea Charon Netta Neat, DO [facility], [facility]  ?  ? ?  ?  ?  ? ? ?

## 2021-07-07 ENCOUNTER — Other Ambulatory Visit: Payer: Self-pay | Admitting: Internal Medicine

## 2021-07-07 NOTE — Telephone Encounter (Signed)
Pt has transferred care to the [facility] office, per Dr. Herbie Baltimore, LOV. Please address ?

## 2021-07-12 ENCOUNTER — Ambulatory Visit (INDEPENDENT_AMBULATORY_CARE_PROVIDER_SITE_OTHER): Payer: Managed Care, Other (non HMO) | Admitting: Family Medicine

## 2021-07-12 ENCOUNTER — Encounter: Payer: Self-pay | Admitting: Family Medicine

## 2021-07-12 VITALS — BP 118/66 | HR 74 | Ht 63.0 in | Wt 170.0 lb

## 2021-07-12 DIAGNOSIS — Z0000 Encounter for general adult medical examination without abnormal findings: Secondary | ICD-10-CM | POA: Diagnosis not present

## 2021-07-12 DIAGNOSIS — F419 Anxiety disorder, unspecified: Secondary | ICD-10-CM

## 2021-07-12 DIAGNOSIS — E039 Hypothyroidism, unspecified: Secondary | ICD-10-CM

## 2021-07-12 DIAGNOSIS — M797 Fibromyalgia: Secondary | ICD-10-CM | POA: Diagnosis not present

## 2021-07-12 DIAGNOSIS — E782 Mixed hyperlipidemia: Secondary | ICD-10-CM

## 2021-07-12 DIAGNOSIS — E559 Vitamin D deficiency, unspecified: Secondary | ICD-10-CM

## 2021-07-12 DIAGNOSIS — G4733 Obstructive sleep apnea (adult) (pediatric): Secondary | ICD-10-CM

## 2021-07-12 DIAGNOSIS — K219 Gastro-esophageal reflux disease without esophagitis: Secondary | ICD-10-CM

## 2021-07-12 DIAGNOSIS — Z9989 Dependence on other enabling machines and devices: Secondary | ICD-10-CM

## 2021-07-12 DIAGNOSIS — F3341 Major depressive disorder, recurrent, in partial remission: Secondary | ICD-10-CM | POA: Diagnosis not present

## 2021-07-12 DIAGNOSIS — R7309 Other abnormal glucose: Secondary | ICD-10-CM

## 2021-07-12 DIAGNOSIS — E538 Deficiency of other specified B group vitamins: Secondary | ICD-10-CM

## 2021-07-12 NOTE — Patient Instructions (Addendum)
Thank you for coming to the office today. ? ?Stay tuned for lab results. ? ?If tired/fatigue not solved by the blood, then can be sleep or routine related. We can follow if needed, may need to return to sleep doctor. ? ?START anti inflammatory topical - OTC Voltaren (generic Diclofenac) topical 2-4 times a day as needed for pain swelling of affected joint for 1-2 weeks or longer. ? ?Use RICE therapy: ?- R - Rest / relative rest with activity modification avoid overuse of joint ?- I - Ice packs (make sure you use a towel or sock / something to protect skin) ?- C - Compression with flexible Knee Sleeve / ACE wrap to apply pressure and reduce swelling allowing more support ?- E - Elevation - if significant swelling, lift leg above heart level (toes above your nose) to help reduce swelling, most helpful at night after day of being on your feet ? ? ?Please schedule a Follow-up Appointment to: Return in about 6 months (around 01/11/2022) for 6 month follow-up if needed.. ? ?If you have any other questions or concerns, please feel free to call the office or send a message through MyChart. You may also schedule an earlier appointment if necessary. ? ?Additionally, you may be receiving a survey about your experience at our office within a few days to 1 week by e-mail or mail. We value your feedback. ? ?Saralyn Pilar, DO ?[facility], [place] ?

## 2021-07-12 NOTE — Assessment & Plan Note (Signed)
Controlled cholesterol on statin and Zetia ?Due lipid today ?The 10-year ASCVD risk score (Arnett DK, et al., 2019) is: 2.1% ? ?Plan: ?1. Continue current meds - Zetia, Livalo ?2. Encourage improved lifestyle - low carb/cholesterol, reduce portion size, continue improving regular exercise ? ?

## 2021-07-12 NOTE — Assessment & Plan Note (Signed)
Chronic problem ?Improved on PPI ?Now had breakthrough off med, will restart BID dosing ?Future GI consult consideration ? ?Re order Omeprazole 40 BID ?

## 2021-07-12 NOTE — Assessment & Plan Note (Addendum)
Stable chronic problem ?Managed on med, Duloxetine ?

## 2021-07-12 NOTE — Assessment & Plan Note (Addendum)
Partial remission ?Controlled on Duloxetine, Wellbutrin, Buspar ?

## 2021-07-12 NOTE — Progress Notes (Signed)
? ?Subjective:  ? ? Patient ID: Christine Berger, female    DOB: 10-13-1960, 61 y.o.   MRN: [id number] ? ?Christine Berger is a 61 y.o. female presenting on 07/12/2021 for Annual Exam ? ? ?HPI ? ?Here for Annual Physical and lab orders, fasting today ? ?Asthma ?Silent Reflux/GERD ?- Last visit with me 12/04/20, for initial visit for same problem asthma/reflux, treated with Singulair and Albuterol PRN, see prior notes for background information. ?- Interval update with Singulair is helpful but not resolving her wheezing, also discussed GERD, she has history of silent reflux, has had EGD few years prior, was on PPI intermittently and has not taken PPI regularly or higher doses still not endorsing heartburn. ? ?Interval update again she has taken PPI with Omeprazole 40mg  and did well but eventually she developed difficulty still when stopped this med, and went up to twice a day dosing with better results, she describes long history of stomach acid. ? ?Her breathing wheezing symptoms improved with treated stomach acid, and then worse again when her acid problem resumed. ? ?- Still has wheezing, she can hear, worse in AM and worse in PM ?- Admits using the Albuterol inhaler 2-3 times a day PRN, not much relief with taking Albuterol PRN. Questionable results. ?Not on maintenance therapy ? ?HYPERLIPIDEMIA: ?Followed by Cardiologist ?- Reports no concerns. Last lipid panel 2022, previously controlled  ?- Currently taking Zetia and Livalo, tolerating well without side effects or myalgias ? ?Hypothyroidism ?On Levothyroxine 24mcg daily, due for lab. ? ?Major Recurrent Depression, in partial remission ?Anxiety ?Doing well on current medication ? ?Vitamin B12 Deficiency ?Vitamin D Deficiency ?She feels fatigued and tired lately. ? ?OSA on CPAP ?- Patient reports prior history of dx OSA and on CPAP for years, prior to treatment initial symptoms were snoring, daytime sleepiness and fatigue, has had several sleep studies in the  past. ?- Today reports that sleep apnea is well controlled. She uses the CPAP machine every night. Tolerates the machine well, and thinks that sleeps better with it and feels good. No new concerns or symptoms. ?- Previously she was advised by Sleep specialist saying that she was sleeping too much. ? ? ? ?Health Maintenance: ? ?Hep C done 2019 ?Cologuard negative 01/05/20, good for 3 years, next 12/2022 ?Followed by GYN for other cancer screening for women. ? ? ? ?  07/12/2021  ?  8:24 AM 04/05/2021  ?  3:41 PM 12/04/2020  ?  3:09 PM  ?Depression screen PHQ 2/9  ?Decreased Interest 1 0 1  ?Down, Depressed, Hopeless 0 1 0  ?PHQ - 2 Score 1 1 1   ?Altered sleeping 1 1 0  ?Tired, decreased energy 3 3 2   ?Change in appetite 1 2 1   ?Feeling bad or failure about yourself  1 1 0  ?Trouble concentrating 0 1 2  ?Moving slowly or fidgety/restless 0 0 0  ?Suicidal thoughts 0 0 0  ?PHQ-9 Score 7 9 6   ?Difficult doing work/chores Somewhat difficult Somewhat difficult Not difficult at all  ? ? ?  07/12/2021  ?  8:24 AM 04/05/2021  ?  3:44 PM 12/04/2020  ?  3:09 PM  ?GAD 7 : Generalized Anxiety Score  ?Nervous, Anxious, on Edge 1 0 2  ?Control/stop worrying 1 0 0  ?Worry too much - different things 0 0 0  ?Trouble relaxing 0 0 1  ?Restless 1 0 2  ?Easily annoyed or irritable 1 0 0  ?Afraid - awful might happen 0  0 0  ?Total GAD 7 Score 4 0 5  ?Anxiety Difficulty Somewhat difficult Not difficult at all Not difficult at all  ? ? ? ?Past Medical History:  ?Diagnosis Date  ? Allergy   ? Anxiety   ? Asthma   ? Depression   ? Fibromyalgia   ? Glaucoma   ? Hyperlipidemia   ? Neuropathy   ? folloewd pcp  ? Osteoporosis   ? Sleep apnea   ? Thyroid disease   ? ?Past Surgical History:  ?Procedure Laterality Date  ? INCONTINENCE SURGERY  10/27/2011  ? bladder sling  ? uterine ablation  04/28/2010  ? ?Social History  ? ?Socioeconomic History  ? Marital status: Married  ?  Spouse name: Not on file  ? Number of children: Not on file  ? Years of education:  Not on file  ? Highest education level: Not on file  ?Occupational History  ? Not on file  ?Tobacco Use  ? Smoking status: Never  ? Smokeless tobacco: Never  ?Vaping Use  ? Vaping Use: Never used  ?Substance and Sexual Activity  ? Alcohol use: Yes  ?  Alcohol/week: 1.0 - 2.0 standard drink  ?  Types: 1 - 2 Glasses of wine per week  ?  Comment: monthly  ? Drug use: No  ? Sexual activity: Not on file  ?Other Topics Concern  ? Not on file  ?Social History Narrative  ? Not on file  ? ?Social Determinants of Health  ? ?Financial Resource Strain: Not on file  ?Food Insecurity: Not on file  ?Transportation Needs: Not on file  ?Physical Activity: Not on file  ?Stress: Not on file  ?Social Connections: Not on file  ?Intimate Partner Violence: Not on file  ? ?Family History  ?Problem Relation Age of Onset  ? Stroke Mother   ? Diabetes Mother   ? Cancer Mother   ? Anuerysm Mother   ? Depression Mother   ? Heart attack Father   ? Arthritis Sister   ? Crohn's disease Sister   ? Breast cancer Neg Hx   ? ?Current Outpatient Medications on File Prior to Visit  ?Medication Sig  ? albuterol (VENTOLIN HFA) 108 (90 Base) MCG/ACT inhaler INHALE 2 PUFFS INTO THE LUNGS EVERY 4 HOURS AS NEEDED FOR WHEEZING, SHORTNESS OF BREATH, OR COUGH  ? buPROPion (WELLBUTRIN XL) 150 MG 24 hr tablet Take 1 tablet (150 mg total) by mouth daily.  ? busPIRone (BUSPAR) 10 MG tablet Take 10 mg by mouth 2 (two) times daily as needed for anxiety.  ? conjugated estrogens (PREMARIN) vaginal cream Premarin 0.625 mg/gram vaginal cream ? INSERT 0.5 G TWICE A WEEK BY VAGINAL ROUTE AS NEEDED FOR 30 DAYS.  ? DULoxetine (CYMBALTA) 60 MG capsule Take 1 capsule (60 mg total) by mouth daily.  ? eletriptan (RELPAX) 40 MG tablet Take 40 mg by mouth as needed for migraine or headache (USE AS DIRECTED). May repeat in 2 hours if headache persists or recurs.  ? estradiol (ESTRACE) 2 MG tablet Take 2 mg by mouth daily.   ? ezetimibe (ZETIA) 10 MG tablet TAKE 1 TABLET BY MOUTH  EVERY DAY **NEED APPT**  ? fluticasone (FLONASE) 50 MCG/ACT nasal spray Place 2 sprays into both nostrils daily as needed for allergies.  ? levothyroxine (SYNTHROID) 75 MCG tablet TAKE 1 TABLET BY MOUTH EVERY DAY  ? LIVALO 4 MG TABS TAKE 1 TABLET BY MOUTH EVERY DAY  ? methocarbamol (ROBAXIN) 500 MG tablet Take 500 mg  by mouth 3 times/day as needed-between meals & bedtime for muscle spasms.  ? metroNIDAZOLE (METROCREAM) 0.75 % cream Apply 1 application topically daily.  ? mirabegron ER (MYRBETRIQ) 25 MG TB24 tablet Take 25 mg by mouth as needed.  ? montelukast (SINGULAIR) 10 MG tablet Take 1 tablet (10 mg total) by mouth at bedtime.  ? progesterone (PROMETRIUM) 100 MG capsule Take 100 mg by mouth daily.   ? terbinafine (LAMISIL) 250 MG tablet Take 250 mg by mouth daily. Only 7 days out of a month  ? omeprazole (PRILOSEC) 40 MG capsule Take 1 capsule (40 mg total) by mouth 2 (two) times daily before a meal.  ? ?No current facility-administered medications on file prior to visit.  ? ? ?Review of Systems  ?Constitutional:  Negative for activity change, appetite change, chills, diaphoresis, fatigue and fever.  ?HENT:  Negative for congestion and hearing loss.   ?Eyes:  Negative for visual disturbance.  ?Respiratory:  Negative for cough, chest tightness, shortness of breath and wheezing.   ?Cardiovascular:  Negative for chest pain, palpitations and leg swelling.  ?Gastrointestinal:  Negative for abdominal pain, constipation, diarrhea, nausea and vomiting.  ?Genitourinary:  Negative for dysuria, frequency and hematuria.  ?Musculoskeletal:  Negative for arthralgias and neck pain.  ?Skin:  Negative for rash.  ?Neurological:  Negative for dizziness, weakness, light-headedness, numbness and headaches.  ?Hematological:  Negative for adenopathy.  ?Psychiatric/Behavioral:  Negative for behavioral problems, dysphoric mood and sleep disturbance.   ?Per HPI unless specifically indicated above ? ? ?   ?Objective:  ?  ?BP 118/66    Pulse 74   Ht 5\' 3"  (1.6 m)   Wt 170 lb (77.1 kg)   SpO2 93%   BMI 30.11 kg/m?   ?Wt Readings from Last 3 Encounters:  ?07/12/21 170 lb (77.1 kg)  ?06/24/21 169 lb (76.7 kg)  ?04/05/21 171 lb 12.8 oz (77.9 kg)

## 2021-07-12 NOTE — Assessment & Plan Note (Signed)
Controlled on current dose Levothyroxine daily ?Due for labs fasting TSH T4 ?

## 2021-07-12 NOTE — Assessment & Plan Note (Signed)
Well controlled, chronic OSA on CPAP - Good adherence to CPAP nightly - Continue current CPAP therapy, patient seems to be benefiting from therapy  

## 2021-07-13 LAB — CBC WITH DIFFERENTIAL/PLATELET
Absolute Monocytes: 419 cells/uL (ref 200–950)
Basophils Absolute: 32 cells/uL (ref 0–200)
Basophils Relative: 0.7 %
Eosinophils Absolute: 140 cells/uL (ref 15–500)
Eosinophils Relative: 3.1 %
HCT: 41.8 % (ref 35.0–45.0)
Hemoglobin: 13.5 g/dL (ref 11.7–15.5)
Lymphs Abs: 1539 cells/uL (ref 850–3900)
MCH: 29.4 pg (ref 27.0–33.0)
MCHC: 32.3 g/dL (ref 32.0–36.0)
MCV: 91.1 fL (ref 80.0–100.0)
MPV: 10.8 fL (ref 7.5–12.5)
Monocytes Relative: 9.3 %
Neutro Abs: 2372 cells/uL (ref 1500–7800)
Neutrophils Relative %: 52.7 %
Platelets: 236 10*3/uL (ref 140–400)
RBC: 4.59 10*6/uL (ref 3.80–5.10)
RDW: 12.1 % (ref 11.0–15.0)
Total Lymphocyte: 34.2 %
WBC: 4.5 10*3/uL (ref 3.8–10.8)

## 2021-07-13 LAB — COMPLETE METABOLIC PANEL WITH GFR
AG Ratio: 1.7 (calc) (ref 1.0–2.5)
ALT: 22 U/L (ref 6–29)
AST: 21 U/L (ref 10–35)
Albumin: 4.1 g/dL (ref 3.6–5.1)
Alkaline phosphatase (APISO): 57 U/L (ref 37–153)
BUN: 9 mg/dL (ref 7–25)
CO2: 28 mmol/L (ref 20–32)
Calcium: 8.6 mg/dL (ref 8.6–10.4)
Chloride: 104 mmol/L (ref 98–110)
Creat: 0.78 mg/dL (ref 0.50–1.03)
Globulin: 2.4 g/dL (calc) (ref 1.9–3.7)
Glucose, Bld: 106 mg/dL — ABNORMAL HIGH (ref 65–99)
Potassium: 4.3 mmol/L (ref 3.5–5.3)
Sodium: 142 mmol/L (ref 135–146)
Total Bilirubin: 0.4 mg/dL (ref 0.2–1.2)
Total Protein: 6.5 g/dL (ref 6.1–8.1)
eGFR: 87 mL/min/{1.73_m2} (ref >=60)

## 2021-07-13 LAB — T4, FREE: Free T4: 0.9 ng/dL (ref 0.8–1.8)

## 2021-07-13 LAB — TSH: TSH: 2.99 mIU/L (ref 0.40–4.50)

## 2021-07-13 LAB — LIPID PANEL
Cholesterol: 224 mg/dL — ABNORMAL HIGH (ref <200)
HDL: 72 mg/dL (ref >=50)
LDL Cholesterol (Calc): 125 mg/dL (calc) — ABNORMAL HIGH
Non-HDL Cholesterol (Calc): 152 mg/dL (calc) — ABNORMAL HIGH (ref <130)
Total CHOL/HDL Ratio: 3.1 (calc) (ref <5.0)
Triglycerides: 159 mg/dL — ABNORMAL HIGH (ref <150)

## 2021-07-13 LAB — VITAMIN D 25 HYDROXY (VIT D DEFICIENCY, FRACTURES): Vit D, 25-Hydroxy: 38 ng/mL (ref 30–100)

## 2021-07-13 LAB — HEMOGLOBIN A1C
Hgb A1c MFr Bld: 6 % of total Hgb — ABNORMAL HIGH (ref <5.7)
Mean Plasma Glucose: 126 mg/dL
eAG (mmol/L): 7 mmol/L

## 2021-07-13 LAB — VITAMIN B12: Vitamin B-12: 608 pg/mL (ref 200–1100)

## 2021-07-15 ENCOUNTER — Encounter: Payer: Self-pay | Admitting: Cardiology

## 2021-07-17 ENCOUNTER — Encounter: Payer: Self-pay | Admitting: Cardiology

## 2021-07-17 DIAGNOSIS — I700 Atherosclerosis of aorta: Secondary | ICD-10-CM | POA: Insufficient documentation

## 2021-07-17 NOTE — Assessment & Plan Note (Addendum)
Seen on Coronary Calcium Score.  Not unexpected given her age and atherosclerotic range hyperlipidemia. ? ?Continue lifestyle modification and lipid-lowering plan with LDL target less than 100. ? ?Continue to monitor blood pressure which seems to be pretty well controlled. ?

## 2021-07-17 NOTE — Assessment & Plan Note (Addendum)
Thankfully, her Coronary Calcium Score last year was essentially 0 which puts her at pretty low risk.  Additionally, her echocardiogram was essentially normal.  She does however have a family history of CAD. ? ?I think we would still do target LDL less than 100 based on her family history, but would just Coronary Calcium Score level would not recommend aspirin for prophylactic purposes. ?

## 2021-07-17 NOTE — Assessment & Plan Note (Signed)
She is due to get her labs checked by Dr. Neoma Laming in April. ? ?It looks like lipids been pretty well controlled on combination of Zetia and Livalo. ? ?We talked about her diet and try to increase exercise. ?Reassuring factor is her Coronary Calcium Score being 0 last year.  This means she is somewhat reduced risk despite her family history. ? ?For now we will continue Zetia and Livalo and will look for labs from PCP. ? ?

## 2021-07-17 NOTE — Assessment & Plan Note (Addendum)
She seems to be doing well with CPAP. ?

## 2021-07-17 NOTE — Assessment & Plan Note (Signed)
Exacerbating her baseline asthma ? ?I recommend that she discuss this with her PCP. ?

## 2021-07-17 NOTE — Telephone Encounter (Signed)
This is a relatively interesting finding, especially if this is while taking Livalo and Zetia. ? ? ?Deep these numbers are correct, then it does not seem we are getting any benefit. ? ?On a positive note, Coronary Calcium Score 0 was very reassuring.  However I do think from long-term benefit we would want to see lipids better. ? ?I will run this by my colleague who is our [facility] Director.  It is possible that with the family history of CAD and hyperlipidemia that even without a high Coronary Calcium Score you could potentially qualify for one of the newer novel agents for treating cholesterol.  ? ?Glenetta Hew, MD ? ?Clinic note will be forwarded to Dr. Debara Pickett  ?

## 2021-07-19 NOTE — Progress Notes (Signed)
I agree a target LDL <100 is reasonable - she would not qualify for other therapies. Question is why did she go up- if she is compliant with meds and it is not a secondary cause of lipid elevation (such as hypothyroidism -she is on medicine, or a medication related increase -steroids, etc,), then it is probably from diet and less activity. I would focus on these - recheck in 6 months. If not successful, could try to get her on a more potent statin - but I suspect she is on Livalo b/c she could not tolerate anything else. ? ?-[place] ?

## 2021-07-20 ENCOUNTER — Encounter: Payer: Self-pay | Admitting: Family Medicine

## 2021-07-20 DIAGNOSIS — R7303 Prediabetes: Secondary | ICD-10-CM | POA: Insufficient documentation

## 2021-08-06 ENCOUNTER — Other Ambulatory Visit: Payer: Self-pay | Admitting: Family Medicine

## 2021-08-06 DIAGNOSIS — K219 Gastro-esophageal reflux disease without esophagitis: Secondary | ICD-10-CM

## 2021-08-06 NOTE — Telephone Encounter (Signed)
Requested medication (s) are due for refill today: no ? ?Requested medication (s) are on the active medication list: yes ? ?Last refill:  07/12/21 ? ?Future visit scheduled: no ? ?Notes to clinic:  Unable to refill per protocol, rx request is too soon. Last refill 07/12/21 for 90, 1 refill. ? ? ?  ?Requested Prescriptions  ?Pending Prescriptions Disp Refills  ? omeprazole (PRILOSEC) 40 MG capsule [Pharmacy Med Name: OMEPRAZOLE DR 40 MG CAPSULE] 90 capsule 1  ?  Sig: TAKE 1 CAPSULE BY MOUTH EVERY DAY BEFORE BREAKFAST  ?  ? Gastroenterology: Proton Pump Inhibitors Passed - 08/06/2021  2:34 AM  ?  ?  Passed - Valid encounter within last 12 months  ?  Recent Outpatient Visits   ? ?      ? 3 weeks ago Annual physical exam  ? [place], DO  ? 4 months ago Laryngopharyngeal reflux (LPR)  ? [place], DO  ? 4 months ago Acute non-recurrent frontal sinusitis  ? [place], NP  ? 8 months ago Fibromyalgia  ? [place], DO  ? ?  ?  ? ? ?  ?  ?  ? ? ?

## 2021-08-08 ENCOUNTER — Telehealth: Payer: Managed Care, Other (non HMO) | Admitting: Physician Assistant

## 2021-08-08 ENCOUNTER — Encounter: Payer: Self-pay | Admitting: Family Medicine

## 2021-08-08 DIAGNOSIS — J0100 Acute maxillary sinusitis, unspecified: Secondary | ICD-10-CM

## 2021-08-08 NOTE — Patient Instructions (Signed)
?Makensie Steenberg, thank you for joining Piedad Climes, PA-C for today's virtual visit.  While this provider is not your primary care provider (PCP), if your PCP is located in our provider database this encounter information will be shared with them immediately following your visit. ? ?Consent: ?(Patient) Oberia Colicchio provided verbal consent for this virtual visit at the beginning of the encounter. ? ?Current Medications: ? ?Current Outpatient Medications:  ?  albuterol (VENTOLIN HFA) 108 (90 Base) MCG/ACT inhaler, INHALE 2 PUFFS INTO THE LUNGS EVERY 4 HOURS AS NEEDED FOR WHEEZING, SHORTNESS OF BREATH, OR COUGH, Disp: 6.7 each, Rfl: 2 ?  buPROPion (WELLBUTRIN XL) 150 MG 24 hr tablet, Take 1 tablet (150 mg total) by mouth daily., Disp: 90 tablet, Rfl: 1 ?  busPIRone (BUSPAR) 10 MG tablet, Take 10 mg by mouth 2 (two) times daily as needed for anxiety., Disp: , Rfl:  ?  conjugated estrogens (PREMARIN) vaginal cream, Premarin 0.625 mg/gram vaginal cream  INSERT 0.5 G TWICE A WEEK BY VAGINAL ROUTE AS NEEDED FOR 30 DAYS., Disp: , Rfl:  ?  DULoxetine (CYMBALTA) 60 MG capsule, Take 1 capsule (60 mg total) by mouth daily., Disp: 90 capsule, Rfl: 3 ?  eletriptan (RELPAX) 40 MG tablet, Take 40 mg by mouth as needed for migraine or headache (USE AS DIRECTED). May repeat in 2 hours if headache persists or recurs., Disp: , Rfl:  ?  estradiol (ESTRACE) 2 MG tablet, Take 2 mg by mouth daily. , Disp: , Rfl:  ?  ezetimibe (ZETIA) 10 MG tablet, TAKE 1 TABLET BY MOUTH EVERY DAY **NEED APPT**, Disp: 90 tablet, Rfl: 3 ?  fluticasone (FLONASE) 50 MCG/ACT nasal spray, Place 2 sprays into both nostrils daily as needed for allergies., Disp: , Rfl:  ?  levothyroxine (SYNTHROID) 75 MCG tablet, TAKE 1 TABLET BY MOUTH EVERY DAY, Disp: 90 tablet, Rfl: 1 ?  LIVALO 4 MG TABS, TAKE 1 TABLET BY MOUTH EVERY DAY, Disp: 90 tablet, Rfl: 3 ?  methocarbamol (ROBAXIN) 500 MG tablet, Take 500 mg by mouth 3 times/day as needed-between meals &  bedtime for muscle spasms., Disp: , Rfl:  ?  metroNIDAZOLE (METROCREAM) 0.75 % cream, Apply 1 application topically daily., Disp: , Rfl:  ?  mirabegron ER (MYRBETRIQ) 25 MG TB24 tablet, Take 25 mg by mouth as needed., Disp: , Rfl:  ?  montelukast (SINGULAIR) 10 MG tablet, Take 1 tablet (10 mg total) by mouth at bedtime., Disp: 90 tablet, Rfl: 3 ?  omeprazole (PRILOSEC) 40 MG capsule, Take 1 capsule (40 mg total) by mouth 2 (two) times daily before a meal., Disp: 180 capsule, Rfl: 1 ?  progesterone (PROMETRIUM) 100 MG capsule, Take 100 mg by mouth daily. , Disp: , Rfl:  ?  terbinafine (LAMISIL) 250 MG tablet, Take 250 mg by mouth daily. Only 7 days out of a month, Disp: , Rfl:   ? ?Medications ordered in this encounter:  ?No orders of the defined types were placed in this encounter. ?  ? ?*If you need refills on other medications prior to your next appointment, please contact your pharmacy* ? ?Follow-Up: ?Call back or seek an in-person evaluation if the symptoms worsen or if the condition fails to improve as anticipated. ? ?Other Instructions ?Please take antibiotic as directed.  Increase fluid intake.  Use Saline nasal spray.  Take a daily multivitamin. Continue your Flonase and Singulair. Start the Xyzal OTC. You can also take Mucinex-DM OTC to help with congestion and any cough.  Place a humidifier in the bedroom.  Please call or return clinic if symptoms are not improving. ? ?Sinusitis ?Sinusitis is redness, soreness, and swelling (inflammation) of the paranasal sinuses. Paranasal sinuses are air pockets within the bones of your face (beneath the eyes, the middle of the forehead, or above the eyes). In healthy paranasal sinuses, mucus is able to drain out, and air is able to circulate through them by way of your nose. However, when your paranasal sinuses are inflamed, mucus and air can become trapped. This can allow bacteria and other germs to grow and cause infection. ?Sinusitis can develop quickly and last only  a short time (acute) or continue over a long period (chronic). Sinusitis that lasts for more than 12 weeks is considered chronic.  ?CAUSES  ?Causes of sinusitis include: ?Allergies. ?Structural abnormalities, such as displacement of the cartilage that separates your nostrils (deviated septum), which can decrease the air flow through your nose and sinuses and affect sinus drainage. ?Functional abnormalities, such as when the small hairs (cilia) that line your sinuses and help remove mucus do not work properly or are not present. ?SYMPTOMS  ?Symptoms of acute and chronic sinusitis are the same. The primary symptoms are pain and pressure around the affected sinuses. Other symptoms include: ?Upper toothache. ?Earache. ?Headache. ?Bad breath. ?Decreased sense of smell and taste. ?A cough, which worsens when you are lying flat. ?Fatigue. ?Fever. ?Thick drainage from your nose, which often is green and may contain pus (purulent). ?Swelling and warmth over the affected sinuses. ?DIAGNOSIS  ?Your caregiver will perform a physical exam. During the exam, your caregiver may: ?Look in your nose for signs of abnormal growths in your nostrils (nasal polyps). ?Tap over the affected sinus to check for signs of infection. ?View the inside of your sinuses (endoscopy) with a special imaging device with a light attached (endoscope), which is inserted into your sinuses. ?If your caregiver suspects that you have chronic sinusitis, one or more of the following tests may be recommended: ?Allergy tests. ?Nasal culture A sample of mucus is taken from your nose and sent to a lab and screened for bacteria. ?Nasal cytology A sample of mucus is taken from your nose and examined by your caregiver to determine if your sinusitis is related to an allergy. ?TREATMENT  ?Most cases of acute sinusitis are related to a viral infection and will resolve on their own within 10 days. Sometimes medicines are prescribed to help relieve symptoms (pain medicine,  decongestants, nasal steroid sprays, or saline sprays).  ?However, for sinusitis related to a bacterial infection, your caregiver will prescribe antibiotic medicines. These are medicines that will help kill the bacteria causing the infection.  ?Rarely, sinusitis is caused by a fungal infection. In theses cases, your caregiver will prescribe antifungal medicine. ?For some cases of chronic sinusitis, surgery is needed. Generally, these are cases in which sinusitis recurs more than 3 times per year, despite other treatments. ?HOME CARE INSTRUCTIONS  ?Drink plenty of water. Water helps thin the mucus so your sinuses can drain more easily. ?Use a humidifier. ?Inhale steam 3 to 4 times a day (for example, sit in the bathroom with the shower running). ?Apply a warm, moist washcloth to your face 3 to 4 times a day, or as directed by your caregiver. ?Use saline nasal sprays to help moisten and clean your sinuses. ?Take over-the-counter or prescription medicines for pain, discomfort, or fever only as directed by your caregiver. ?SEEK IMMEDIATE MEDICAL CARE IF: ?You have increasing pain  or severe headaches. ?You have nausea, vomiting, or drowsiness. ?You have swelling around your face. ?You have vision problems. ?You have a stiff neck. ?You have difficulty breathing. ?MAKE SURE YOU:  ?Understand these instructions. ?Will watch your condition. ?Will get help right away if you are not doing well or get worse. ?Document Released: 03/14/2005 Document Revised: 06/06/2011 Document Reviewed: 03/29/2011 ?ExitCare? Patient Information ?2014 [place], [place]. ? ? ? ? ?If you have been instructed to have an in-person evaluation today at a local Urgent Care facility, please use the link below. It will take you to a list of all of our available [facility], including address, phone number and hours of operation. Please do not delay care.  ?[facility] ? ?If you or a family member do not have a primary care provider,  use the link below to schedule a visit and establish care. When you choose a [facility] primary care physician or advanced practice provider, you gain a long-term partner in health. ?Find a Psychologist, sport and exercise

## 2021-08-08 NOTE — Progress Notes (Addendum)
?Virtual Visit Consent  ? ?Christine Berger, you are scheduled for a virtual visit with a [facility] provider today. Just as with appointments in the office, your consent must be obtained to participate. Your consent will be active for this visit and any virtual visit you may have with one of our providers in the next 365 days. If you have a MyChart account, a copy of this consent can be sent to you electronically. ? ?As this is a virtual visit, video technology does not allow for your provider to perform a traditional examination. This may limit your provider's ability to fully assess your condition. If your provider identifies any concerns that need to be evaluated in person or the need to arrange testing (such as labs, EKG, etc.), we will make arrangements to do so. Although advances in technology are sophisticated, we cannot ensure that it will always work on either your end or our end. If the connection with a video visit is poor, the visit may have to be switched to a telephone visit. With either a video or telephone visit, we are not always able to ensure that we have a secure connection. ? ?By engaging in this virtual visit, you consent to the provision of healthcare and authorize for your insurance to be billed (if applicable) for the services provided during this visit. Depending on your insurance coverage, you may receive a charge related to this service. ? ?I need to obtain your verbal consent now. Are you willing to proceed with your visit today? Christine Berger has provided verbal consent on 08/08/2021 for a virtual visit (video or telephone). Piedad Climes, PA-C ? ?Date: 08/08/2021 11:43 AM ? ?Virtual Visit via Video Note  ? ?I, Piedad Climes, connected with  Christine Berger  ([id number], 1960/11/03) on 08/08/21 at 11:45 AM EDT by a video-enabled telemedicine application and verified that I am speaking with the correct person using two identifiers. ? ?Location: ?Patient: Virtual Visit  Location Patient: Home ?Provider: Virtual Visit Location Provider: Home Office ?  ?I discussed the limitations of evaluation and management by telemedicine and the availability of in person appointments. The patient expressed understanding and agreed to proceed.   ? ?History of Present Illness: ?Christine Berger is a 61 y.o. who identifies as a female who was assigned female at birth, and is being seen today for URI symptoms. Patient endorses symptoms starting earlier in the week with nasal congestion, scratchy throat. Progressing significantly since onset. ow with sinus pressure, sinus pain, upper tooth pain and bilateral ear pressure/pain. Unsure of fever.  Denies chest pain or SOB. Denies recent travel or sick contact. Has use OTC Mucinex, [facility] Huger Med sinus rinse with her [employer].  ? ?HPI: HPI  ?Problems:  ?Patient Active Problem List  ? Diagnosis Date Noted  ? Pre-diabetes 07/20/2021  ? Thoracic aortic atherosclerosis (HCC) 07/17/2021  ? Acquired hypothyroidism 07/12/2021  ? OSA on CPAP 07/12/2021  ? Family history of premature CAD 06/24/2021  ? Laryngopharyngeal reflux (LPR) 04/05/2021  ? Extrinsic asthma 12/04/2020  ? Seasonal allergies 12/04/2020  ? Major depressive disorder, recurrent episode, in partial remission (HCC) 12/04/2020  ? Anxiety 12/04/2020  ? Fibromyalgia   ? Mixed hyperlipidemia 07/23/2013  ?  ?Allergies:  ?Allergies  ?Allergen Reactions  ? Aspartame Cough and Shortness Of Breath  ? Penicillins   ?  Hives and tightness in chest  ? Erythromycin Nausea And Vomiting  ? Milk Protein Other (See Comments)  ? Statins Other (See  Comments)  ?  Muscle aches in past on pravastatin, Crestor, Zocor, Lipitor - while in [place] ?Muscle aches in past on pravastatin, Crestor, Zocor, Lipitor - while in [place]  ? ?Medications:  ?Current Outpatient Medications:  ?  albuterol (VENTOLIN HFA) 108 (90 Base) MCG/ACT inhaler, INHALE 2 PUFFS INTO THE LUNGS EVERY 4 HOURS AS NEEDED FOR WHEEZING, SHORTNESS OF BREATH, OR  COUGH, Disp: 6.7 each, Rfl: 2 ?  buPROPion (WELLBUTRIN XL) 150 MG 24 hr tablet, Take 1 tablet (150 mg total) by mouth daily., Disp: 90 tablet, Rfl: 1 ?  busPIRone (BUSPAR) 10 MG tablet, Take 10 mg by mouth 2 (two) times daily as needed for anxiety., Disp: , Rfl:  ?  conjugated estrogens (PREMARIN) vaginal cream, Premarin 0.625 mg/gram vaginal cream  INSERT 0.5 G TWICE A WEEK BY VAGINAL ROUTE AS NEEDED FOR 30 DAYS., Disp: , Rfl:  ?  DULoxetine (CYMBALTA) 60 MG capsule, Take 1 capsule (60 mg total) by mouth daily., Disp: 90 capsule, Rfl: 3 ?  eletriptan (RELPAX) 40 MG tablet, Take 40 mg by mouth as needed for migraine or headache (USE AS DIRECTED). May repeat in 2 hours if headache persists or recurs., Disp: , Rfl:  ?  estradiol (ESTRACE) 2 MG tablet, Take 2 mg by mouth daily. , Disp: , Rfl:  ?  ezetimibe (ZETIA) 10 MG tablet, TAKE 1 TABLET BY MOUTH EVERY DAY **NEED APPT**, Disp: 90 tablet, Rfl: 3 ?  fluticasone (FLONASE) 50 MCG/ACT nasal spray, Place 2 sprays into both nostrils daily as needed for allergies., Disp: , Rfl:  ?  levothyroxine (SYNTHROID) 75 MCG tablet, TAKE 1 TABLET BY MOUTH EVERY DAY, Disp: 90 tablet, Rfl: 1 ?  LIVALO 4 MG TABS, TAKE 1 TABLET BY MOUTH EVERY DAY, Disp: 90 tablet, Rfl: 3 ?  methocarbamol (ROBAXIN) 500 MG tablet, Take 500 mg by mouth 3 times/day as needed-between meals & bedtime for muscle spasms., Disp: , Rfl:  ?  metroNIDAZOLE (METROCREAM) 0.75 % cream, Apply 1 application topically daily., Disp: , Rfl:  ?  mirabegron ER (MYRBETRIQ) 25 MG TB24 tablet, Take 25 mg by mouth as needed., Disp: , Rfl:  ?  montelukast (SINGULAIR) 10 MG tablet, Take 1 tablet (10 mg total) by mouth at bedtime., Disp: 90 tablet, Rfl: 3 ?  omeprazole (PRILOSEC) 40 MG capsule, Take 1 capsule (40 mg total) by mouth 2 (two) times daily before a meal., Disp: 180 capsule, Rfl: 1 ?  progesterone (PROMETRIUM) 100 MG capsule, Take 100 mg by mouth daily. , Disp: , Rfl:  ?  terbinafine (LAMISIL) 250 MG tablet, Take 250 mg  by mouth daily. Only 7 days out of a month, Disp: , Rfl:  ? ?Observations/Objective: ?Patient is well-developed, well-nourished in no acute distress.  ?Resting comfortably at home.  ?Head is normocephalic, atraumatic.  ?No labored breathing. ?Speech is clear and coherent with logical content.  ?Patient is alert and oriented at baseline.  ? ?Assessment and Plan: ?1. Acute non-recurrent maxillary sinusitis ? ?Rx Azithromycin  Increase fluids.  Rest.  Saline nasal spray.  Probiotic.  Mucinex as directed.  Humidifier in bedroom. Restart Flonase. Continue Singulair. Recommend starting Xyzal OTC.  Call or return to clinic if symptoms are not improving. ? ? ?Follow Up Instructions: ?I discussed the assessment and treatment plan with the patient. The patient was provided an opportunity to ask questions and all were answered. The patient agreed with the plan and demonstrated an understanding of the instructions.  A copy of instructions were sent to  the patient via MyChart unless otherwise noted below.  ? ?The patient was advised to call back or seek an in-person evaluation if the symptoms worsen or if the condition fails to improve as anticipated. ? ?Time:  ?I spent 10 minutes with the patient via telehealth technology discussing the above problems/concerns.   ? ?Piedad Climes, PA-C ?

## 2021-08-09 ENCOUNTER — Telehealth: Payer: Managed Care, Other (non HMO) | Admitting: Physician Assistant

## 2021-08-09 ENCOUNTER — Encounter: Payer: Self-pay | Admitting: Physician Assistant

## 2021-08-09 ENCOUNTER — Other Ambulatory Visit: Payer: Self-pay | Admitting: Physician Assistant

## 2021-08-09 MED ORDER — PREDNISONE 20 MG PO TABS: 40.0000 mg | ORAL_TABLET | Freq: Every day | ORAL | 0 refills | Status: DC

## 2021-08-09 MED ORDER — AZITHROMYCIN 250 MG PO TABS: ORAL_TABLET | ORAL | 0 refills | Status: AC

## 2021-08-09 NOTE — Progress Notes (Signed)
Had visit yesterday with this provider. Antibiotic not received by pharmacy. Resent. No charge.  ?

## 2021-08-09 NOTE — Addendum Note (Signed)
Addended by: Waldon Merl on: 08/09/2021 09:59 AM ? ? Modules accepted: Orders ? ?

## 2021-08-16 ENCOUNTER — Telehealth: Payer: Managed Care, Other (non HMO) | Admitting: Family Medicine

## 2021-08-16 DIAGNOSIS — R051 Acute cough: Secondary | ICD-10-CM

## 2021-08-16 NOTE — Patient Instructions (Signed)
Please follow up in person for your new chest congestion and cough.

## 2021-08-16 NOTE — Progress Notes (Signed)
   Needs in person eval- recent sinus infection treatment provided on 5/14; feel a bit better but is coughing and has chest congestion now and asthma/bronchitis like symptoms.   Patient acknowledged agreement and understanding of the plan.

## 2021-08-18 ENCOUNTER — Encounter: Payer: Self-pay | Admitting: Family Medicine

## 2021-08-18 ENCOUNTER — Ambulatory Visit: Payer: Managed Care, Other (non HMO) | Admitting: Family Medicine

## 2021-08-18 VITALS — BP 124/60 | HR 81 | Ht 63.0 in | Wt 168.8 lb

## 2021-08-18 DIAGNOSIS — J0110 Acute frontal sinusitis, unspecified: Secondary | ICD-10-CM | POA: Diagnosis not present

## 2021-08-18 DIAGNOSIS — J9801 Acute bronchospasm: Secondary | ICD-10-CM | POA: Diagnosis not present

## 2021-08-18 MED ORDER — PREDNISONE 10 MG PO TABS: ORAL_TABLET | ORAL | 0 refills | Status: DC

## 2021-08-18 MED ORDER — DOXYCYCLINE HYCLATE 100 MG PO TABS: 100.0000 mg | ORAL_TABLET | Freq: Two times a day (BID) | ORAL | 0 refills | Status: DC

## 2021-08-18 NOTE — Patient Instructions (Addendum)
Thank you for coming to the office today.  Start taking Doxycycline antibiotic 100mg  twice daily for 10 days. Take with full glass of water and stay upright for at least 30 min after taking, may be seated or standing, but should NOT lay down. This is just a safety precaution, if this medicine does not go all the way down throat well it could cause some burning discomfort to throat and esophagus.  If not resolving can use Prednisone taper.  Keep on  nasal steroid Flonase 2 sprays in each nostril daily for 4-6 weeks, may repeat course seasonally or as needed  May limit mucinex now  Please schedule a Follow-up Appointment to: Return if symptoms worsen or fail to improve.  If you have any other questions or concerns, please feel free to call the office or send a message through [facility]. You may also schedule an earlier appointment if necessary.  Additionally, you may be receiving a survey about your experience at our office within a few days to 1 week by e-mail or mail. We value your feedback.  Nobie Putnam, DO [facility]

## 2021-08-18 NOTE — Progress Notes (Signed)
Subjective:    Patient ID: Christine Berger, female    DOB: June 12, 1960, 61 y.o.   MRN: [id number]  Christine Berger is a 61 y.o. female presenting on 08/18/2021 for Sinusitis   HPI  Sinusitis Reports symptoms onset 12+ days initially with sore throat and not feeling well, daughter sick with cold prior to that. Then she did E Visit on 08/08/21 she did mucinex, sinus meds, neti pot and she was given Zpak and Prednisone it only helped temporarily. Her drainage was thicker or yellow then it became more clear. - She has PCN allergy but takes Zpak and Doxycycline in past for sinusitis. - Admits dyspnea winded wheezing - No COVID testing. - Admits sweating episodes. No fevers. - denies nausea vomiting       07/12/2021    8:24 AM 04/05/2021    3:41 PM 12/04/2020    3:09 PM  Depression screen PHQ 2/9  Decreased Interest 1 0 1  Down, Depressed, Hopeless 0 1 0  PHQ - 2 Score 1 1 1   Altered sleeping 1 1 0  Tired, decreased energy 3 3 2   Change in appetite 1 2 1   Feeling bad or failure about yourself  1 1 0  Trouble concentrating 0 1 2  Moving slowly or fidgety/restless 0 0 0  Suicidal thoughts 0 0 0  PHQ-9 Score 7 9 6   Difficult doing work/chores Somewhat difficult Somewhat difficult Not difficult at all    Social History   Tobacco Use   Smoking status: Never   Smokeless tobacco: Never  Vaping Use   Vaping Use: Never used  Substance Use Topics   Alcohol use: Yes    Alcohol/week: 1.0 - 2.0 standard drink    Types: 1 - 2 Glasses of wine per week    Comment: monthly   Drug use: No    Review of Systems Per HPI unless specifically indicated above     Objective:    BP 124/60   Pulse 81   Ht 5' 3"  (1.6 m)   Wt 168 lb 12.8 oz (76.6 kg)   SpO2 98%   BMI 29.90 kg/m   Wt Readings from Last 3 Encounters:  08/18/21 168 lb 12.8 oz (76.6 kg)  07/12/21 170 lb (77.1 kg)  06/24/21 169 lb (76.7 kg)    Physical Exam    Results for orders placed or performed in visit on  07/12/21  Hemoglobin A1c  Result Value Ref Range   Hgb A1c MFr Bld 6.0 (H) <5.7 % of total Hgb   Mean Plasma Glucose 126 mg/dL   eAG (mmol/L) 7.0 mmol/L  CBC with Differential/Platelet  Result Value Ref Range   WBC 4.5 3.8 - 10.8 Thousand/uL   RBC 4.59 3.80 - 5.10 Million/uL   Hemoglobin 13.5 11.7 - 15.5 g/dL   HCT 41.8 35.0 - 45.0 %   MCV 91.1 80.0 - 100.0 fL   MCH 29.4 27.0 - 33.0 pg   MCHC 32.3 32.0 - 36.0 g/dL   RDW 12.1 11.0 - 15.0 %   Platelets 236 140 - 400 Thousand/uL   MPV 10.8 7.5 - 12.5 fL   Neutro Abs 2,372 1,500 - 7,800 cells/uL   Lymphs Abs 1,539 850 - 3,900 cells/uL   Absolute Monocytes 419 200 - 950 cells/uL   Eosinophils Absolute 140 15 - 500 cells/uL   Basophils Absolute 32 0 - 200 cells/uL   Neutrophils Relative % 52.7 %   Total Lymphocyte 34.2 %   Monocytes Relative  9.3 %   Eosinophils Relative 3.1 %   Basophils Relative 0.7 %  COMPLETE METABOLIC PANEL WITH GFR  Result Value Ref Range   Glucose, Bld 106 (H) 65 - 99 mg/dL   BUN 9 7 - 25 mg/dL   Creat 0.78 0.50 - 1.03 mg/dL   eGFR 87 > OR = 60 mL/min/1.72m   BUN/Creatinine Ratio NOT APPLICABLE 6 - 22 (calc)   Sodium 142 135 - 146 mmol/L   Potassium 4.3 3.5 - 5.3 mmol/L   Chloride 104 98 - 110 mmol/L   CO2 28 20 - 32 mmol/L   Calcium 8.6 8.6 - 10.4 mg/dL   Total Protein 6.5 6.1 - 8.1 g/dL   Albumin 4.1 3.6 - 5.1 g/dL   Globulin 2.4 1.9 - 3.7 g/dL (calc)   AG Ratio 1.7 1.0 - 2.5 (calc)   Total Bilirubin 0.4 0.2 - 1.2 mg/dL   Alkaline phosphatase (APISO) 57 37 - 153 U/L   AST 21 10 - 35 U/L   ALT 22 6 - 29 U/L  Lipid panel  Result Value Ref Range   Cholesterol 224 (H) <200 mg/dL   HDL 72 > OR = 50 mg/dL   Triglycerides 159 (H) <150 mg/dL   LDL Cholesterol (Calc) 125 (H) mg/dL (calc)   Total CHOL/HDL Ratio 3.1 <5.0 (calc)   Non-HDL Cholesterol (Calc) 152 (H) <130 mg/dL (calc)  TSH  Result Value Ref Range   TSH 2.99 0.40 - 4.50 mIU/L  T4, free  Result Value Ref Range   Free T4 0.9 0.8 - 1.8  ng/dL  Vitamin B12  Result Value Ref Range   Vitamin B-12 608 200 - 1,100 pg/mL  HM HEPATITIS C SCREENING LAB  Result Value Ref Range   HM Hepatitis Screen Negative-Validated   Cologuard  Result Value Ref Range   Cologuard Negative Negative  VITAMIN D 25 Hydroxy (Vit-D Deficiency, Fractures)  Result Value Ref Range   Vit D, 25-Hydroxy 38 30 - 100 ng/mL      Assessment & Plan:   Problem List Items Addressed This Visit   None Visit Diagnoses     Acute non-recurrent frontal sinusitis    -  Primary   Relevant Medications   doxycycline (VIBRA-TABS) 100 MG tablet   predniSONE (DELTASONE) 10 MG tablet   Bronchospasm       Relevant Medications   predniSONE (DELTASONE) 10 MG tablet       Consistent with acute frontal sinusitis, likely initially viral URI vs allergic rhinitis component with worsening concern for bacterial infection.   Plan: 1. Seems initial viral infection resolving, now second sickening with sinusitis symptoms lingering, initial zpak did not resolve. Will offer Doxycycline course now, as prescribed, precautions given - Add prednisone taper only if not resolving bronchospasm - Use inhaler albuterol - Continue Flonase - Limit mucinex now. Use sudafed if need Return criteria reviewed     Meds ordered this encounter  Medications   doxycycline (VIBRA-TABS) 100 MG tablet    Sig: Take 1 tablet (100 mg total) by mouth 2 (two) times daily. For 10 days. Take with full glass of water, stay upright 30 min after taking.    Dispense:  20 tablet    Refill:  0   predniSONE (DELTASONE) 10 MG tablet    Sig: Take 5 tabs with breakfast Day 1, 4 tabs Day 2, 3 tabs Day 3, 2 tabs Day 4, 1 tabs Day 5    Dispense:  15 tablet    Refill:  0      Follow up plan: Return if symptoms worsen or fail to improve.   Nobie Putnam, [facility] 08/18/2021, 11:26 AM

## 2021-09-05 ENCOUNTER — Other Ambulatory Visit: Payer: Self-pay | Admitting: Internal Medicine

## 2021-09-07 NOTE — Telephone Encounter (Signed)
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

## 2021-10-06 ENCOUNTER — Other Ambulatory Visit: Payer: Self-pay | Admitting: Family Medicine

## 2021-10-06 NOTE — Telephone Encounter (Signed)
Requested Prescriptions  Pending Prescriptions Disp Refills  . levothyroxine (SYNTHROID) 75 MCG tablet [Pharmacy Med Name: LEVOTHYROXINE 75 MCG TABLET] 90 tablet 1    Sig: TAKE 1 TABLET BY MOUTH EVERY DAY     Endocrinology:  Hypothyroid Agents Passed - 10/06/2021  3:35 AM      Passed - TSH in normal range and within 360 days    TSH  Date Value Ref Range Status  07/12/2021 2.99 0.40 - 4.50 mIU/L Final         Passed - Valid encounter within last 12 months    Recent Outpatient Visits          1 month ago Acute non-recurrent frontal sinusitis   [facility] [place], Netta Neat, DO   2 months ago Annual physical exam   [facility] Smitty Cords, DO   6 months ago Laryngopharyngeal reflux (LPR)   [facility] Smitty Cords, DO   6 months ago Acute non-recurrent frontal sinusitis   [facility] [place], Salvadore Oxford, NP   10 months ago Fibromyalgia   [facility] [place], Netta Neat, [place]

## 2021-10-16 ENCOUNTER — Other Ambulatory Visit: Payer: Self-pay | Admitting: Family Medicine

## 2021-10-16 DIAGNOSIS — F3341 Major depressive disorder, recurrent, in partial remission: Secondary | ICD-10-CM

## 2021-10-16 DIAGNOSIS — J4520 Mild intermittent asthma, uncomplicated: Secondary | ICD-10-CM

## 2021-10-16 DIAGNOSIS — J302 Other seasonal allergic rhinitis: Secondary | ICD-10-CM

## 2021-10-19 NOTE — Telephone Encounter (Signed)
Requested Prescriptions  Pending Prescriptions Disp Refills  . buPROPion (WELLBUTRIN XL) 150 MG 24 hr tablet [Pharmacy Med Name: BUPROPION HCL XL 150 MG TABLET] 90 tablet 1    Sig: TAKE 1 TABLET BY MOUTH EVERY DAY     Psychiatry: Antidepressants - bupropion Passed - 10/16/2021  8:13 PM      Passed - Cr in normal range and within 360 days    Creat  Date Value Ref Range Status  07/12/2021 0.78 0.50 - 1.03 mg/dL Final         Passed - AST in normal range and within 360 days    AST  Date Value Ref Range Status  07/12/2021 21 10 - 35 U/L Final         Passed - ALT in normal range and within 360 days    ALT  Date Value Ref Range Status  07/12/2021 22 6 - 29 U/L Final         Passed - Completed PHQ-2 or PHQ-9 in the last 360 days      Passed - Last BP in normal range    BP Readings from Last 1 Encounters:  08/18/21 124/60         Passed - Valid encounter within last 6 months    Recent Outpatient Visits          2 months ago Acute non-recurrent frontal sinusitis   [facility] [place], Netta Neat, DO   3 months ago Annual physical exam   [facility] [place] [facility] Smitty Cords, DO   6 months ago Laryngopharyngeal reflux (LPR)   [facility] [place], Netta Neat, DO   7 months ago Acute non-recurrent frontal sinusitis   [facility] [place], Salvadore Oxford, NP   10 months ago Fibromyalgia   [facility], Netta Neat, DO             . montelukast (SINGULAIR) 10 MG tablet [Pharmacy Med Name: MONTELUKAST SOD 10 MG TABLET] 90 tablet 3    Sig: TAKE 1 TABLET BY MOUTH EVERYDAY AT BEDTIME     Pulmonology:  Leukotriene Inhibitors Passed - 10/16/2021  8:13 PM      Passed - Valid encounter within last 12 months    Recent Outpatient Visits          2 months ago Acute non-recurrent frontal sinusitis   [facility] [place], Netta Neat, DO   3 months ago Annual physical exam    [facility] Smitty Cords, DO   6 months ago Laryngopharyngeal reflux (LPR)   [facility] [place], Netta Neat, DO   7 months ago Acute non-recurrent frontal sinusitis   [facility] [place], Salvadore Oxford, NP   10 months ago Fibromyalgia   [facility] [place], Netta Neat, [place]

## 2021-10-20 ENCOUNTER — Telehealth: Payer: Self-pay

## 2021-10-20 NOTE — Telephone Encounter (Signed)
**Note De-Identified Chauna Osoria Obfuscation** Maelani Yarbro Key: [id number] Outcome An active PA is already on file with expiration date of 03/27/2098. Please wait to resubmit request within 60 days of that expiration date to obtain a PA renewal. Drug Livalo 4MG  tablets Form [employer] Electronic PA Form [phone] NCPDP)  [id number] - [place], [place] - [address] (Ph: [phone]) is aware of this approval.

## 2021-10-27 ENCOUNTER — Other Ambulatory Visit: Payer: Self-pay | Admitting: Family Medicine

## 2021-10-27 DIAGNOSIS — K219 Gastro-esophageal reflux disease without esophagitis: Secondary | ICD-10-CM

## 2021-10-28 NOTE — Telephone Encounter (Signed)
Dose inconsistent with current med list. Requested Prescriptions  Pending Prescriptions Disp Refills  . omeprazole (PRILOSEC) 40 MG capsule [Pharmacy Med Name: OMEPRAZOLE DR 40 MG CAPSULE] 90 capsule 1    Sig: TAKE 1 CAPSULE BY MOUTH EVERY DAY BEFORE BREAKFAST     Gastroenterology: Proton Pump Inhibitors Passed - 10/27/2021 11:18 AM      Passed - Valid encounter within last 12 months    Recent Outpatient Visits          2 months ago Acute non-recurrent frontal sinusitis   [facility] [place], Netta Neat, DO   3 months ago Annual physical exam   [facility] Smitty Cords, DO   6 months ago Laryngopharyngeal reflux (LPR)   [facility] Smitty Cords, DO   7 months ago Acute non-recurrent frontal sinusitis   [facility] [place], Salvadore Oxford, NP   10 months ago Fibromyalgia   [facility] [place], Netta Neat, [place]

## 2021-11-29 ENCOUNTER — Other Ambulatory Visit: Payer: Self-pay | Admitting: Family Medicine

## 2021-11-29 DIAGNOSIS — J4520 Mild intermittent asthma, uncomplicated: Secondary | ICD-10-CM

## 2021-12-01 NOTE — Telephone Encounter (Signed)
Requested Prescriptions  Pending Prescriptions Disp Refills  . albuterol (VENTOLIN HFA) 108 (90 Base) MCG/ACT inhaler [Pharmacy Med Name: ALBUTEROL HFA (PROVENTIL) INH] 6.7 each 2    Sig: INHALE 2 PUFFS INTO THE LUNGS EVERY 4 HOURS AS NEEDED FOR WHEEZING, SHORTNESS OF BREATH, OR COUGH     Pulmonology:  Beta Agonists 2 Passed - 11/29/2021  9:34 PM      Passed - Last BP in normal range    BP Readings from Last 1 Encounters:  08/18/21 124/60         Passed - Last Heart Rate in normal range    Pulse Readings from Last 1 Encounters:  08/18/21 81         Passed - Valid encounter within last 12 months    Recent Outpatient Visits          3 months ago Acute non-recurrent frontal sinusitis   [facility] [place], Netta Neat, DO   4 months ago Annual physical exam   [facility] Smitty Cords, DO   8 months ago Laryngopharyngeal reflux (LPR)   [facility] [place], Netta Neat, DO   8 months ago Acute non-recurrent frontal sinusitis   [facility] [place], Salvadore Oxford, NP   12 months ago Fibromyalgia   [facility] [place], Netta Neat, [place]

## 2021-12-14 ENCOUNTER — Other Ambulatory Visit (HOSPITAL_BASED_OUTPATIENT_CLINIC_OR_DEPARTMENT_OTHER): Payer: Self-pay

## 2021-12-31 ENCOUNTER — Encounter: Payer: Self-pay | Admitting: Cardiology

## 2022-02-10 ENCOUNTER — Other Ambulatory Visit: Payer: Self-pay | Admitting: Family Medicine

## 2022-02-10 NOTE — Telephone Encounter (Signed)
Requested Prescriptions  Pending Prescriptions Disp Refills   levothyroxine (SYNTHROID) 75 MCG tablet [Pharmacy Med Name: LEVOTHYROXINE 75 MCG TABLET] 90 tablet 1    Sig: TAKE 1 TABLET BY MOUTH EVERY DAY     Endocrinology:  Hypothyroid Agents Passed - 02/10/2022  2:13 AM      Passed - TSH in normal range and within 360 days    TSH  Date Value Ref Range Status  07/12/2021 2.99 0.40 - 4.50 mIU/L Final         Passed - Valid encounter within last 12 months    Recent Outpatient Visits           5 months ago Acute non-recurrent frontal sinusitis   [facility] [place], Netta Neat, DO   7 months ago Annual physical exam   [facility] Smitty Cords, DO   10 months ago Laryngopharyngeal reflux (LPR)   [facility] Smitty Cords, DO   10 months ago Acute non-recurrent frontal sinusitis   [facility] [place], Salvadore Oxford, NP   1 year ago Fibromyalgia   [facility] [place], Netta Neat, [place]

## 2022-02-13 ENCOUNTER — Encounter: Payer: Self-pay | Admitting: Cardiology

## 2022-02-14 MED ORDER — PITAVASTATIN CALCIUM 4 MG PO TABS: 1.0000 | ORAL_TABLET | Freq: Every day | ORAL | 1 refills | Status: DC

## 2022-03-07 ENCOUNTER — Encounter: Payer: Self-pay | Admitting: Family Medicine

## 2022-03-07 DIAGNOSIS — F3341 Major depressive disorder, recurrent, in partial remission: Secondary | ICD-10-CM

## 2022-03-07 DIAGNOSIS — M797 Fibromyalgia: Secondary | ICD-10-CM

## 2022-03-07 MED ORDER — DULOXETINE HCL 60 MG PO CPEP: 60.0000 mg | ORAL_CAPSULE | Freq: Every day | ORAL | 3 refills | Status: DC

## 2022-03-17 ENCOUNTER — Other Ambulatory Visit: Payer: Self-pay

## 2022-03-22 ENCOUNTER — Encounter: Payer: Self-pay | Admitting: Family Medicine

## 2022-03-22 DIAGNOSIS — F419 Anxiety disorder, unspecified: Secondary | ICD-10-CM

## 2022-03-23 MED ORDER — BUSPIRONE HCL 10 MG PO TABS: 10.0000 mg | ORAL_TABLET | Freq: Two times a day (BID) | ORAL | 3 refills | Status: DC | PRN

## 2022-04-04 IMAGING — CT CT CARDIAC CORONARY ARTERY CALCIUM SCORE
3 series · 14 of 20 positions shown, 15 images · non-contrast
Comparison: None.
COMPARISON: None.

Addendum:
EXAM:
OVER-READ INTERPRETATION  CT CHEST

The following report is an over-read performed by radiologist Dr.
Yordanis Thigpen [REDACTED] on 05/26/2020. This over-read
does not include interpretation of cardiac or coronary anatomy or
pathology. The calcium score interpretation by the cardiologist is
attached.
TECHNIQUE: The patient was scanned on a Siemens Force scanner. Axial
non-contrast 3 mm slices were carried out through the heart. The
data set was analyzed on a dedicated work station and scored using
the Agatson method.

[Series 2: casc 3.0 bv41 2 bestdiast 66 % · axial · 0.41mm/px · z∈[-194,-132]mm · 4 of 35 slices shown, 5 images]
[im 7/35  vessel]
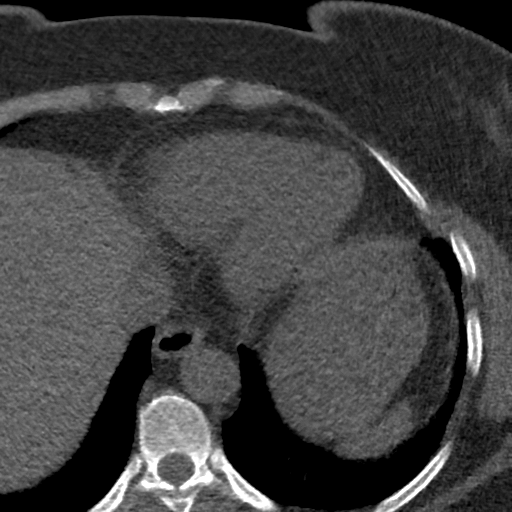
[im 7/35  lung]
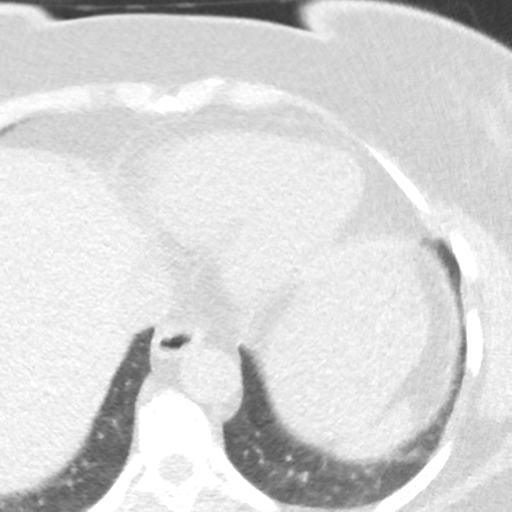
[im 14/35  vessel]
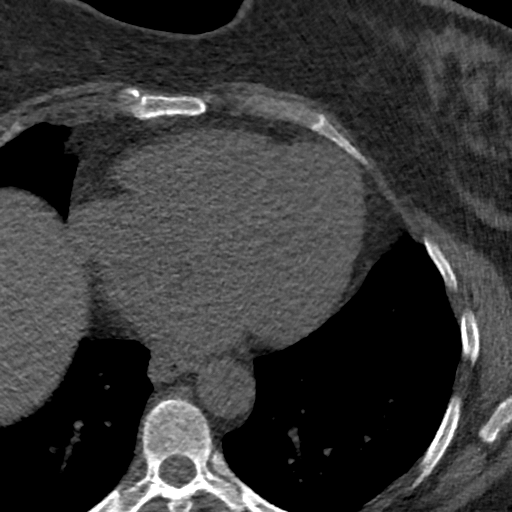
[im 21/35  vessel]
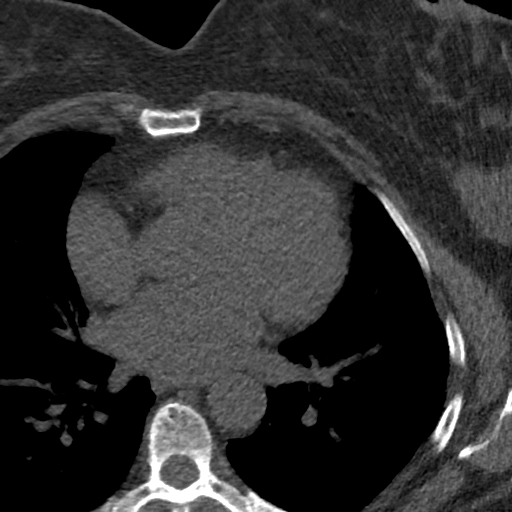
[im 28/35  vessel]
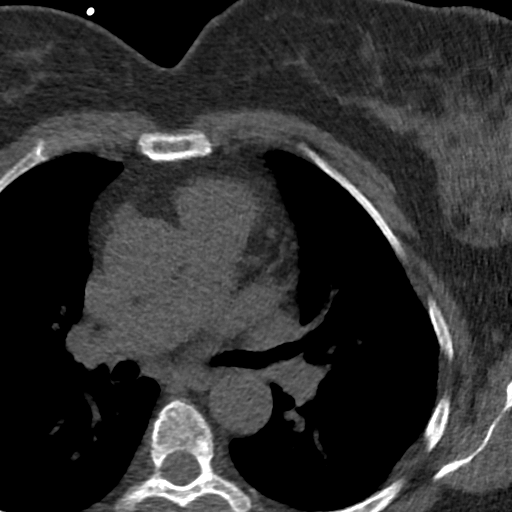

[Series 3: lung 66 % · axial · 0.65mm/px · z∈[-198,-128]mm · 5 of 35 slices shown]
[im 6/35  lung]
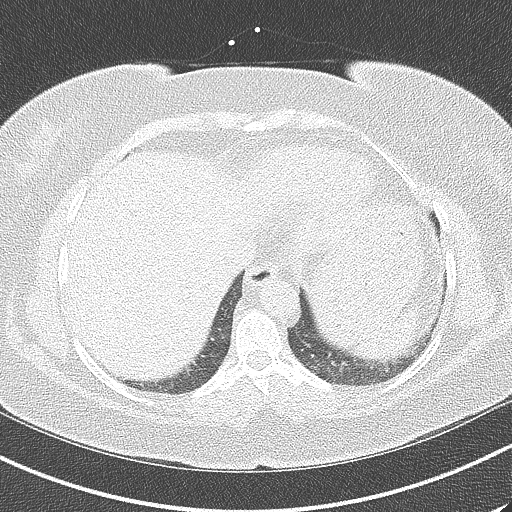
[im 12/35  lung]
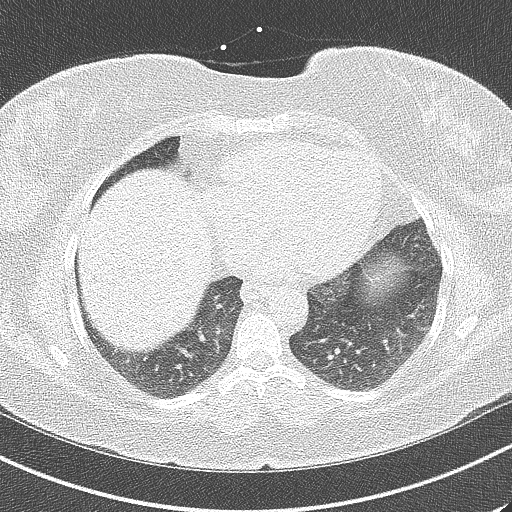
[im 18/35  lung]
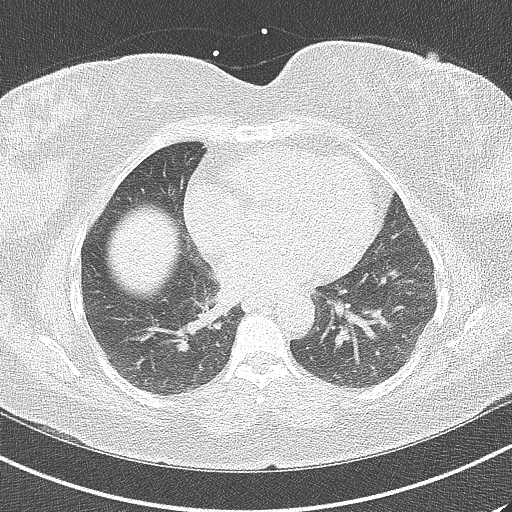
[im 23/35  lung]
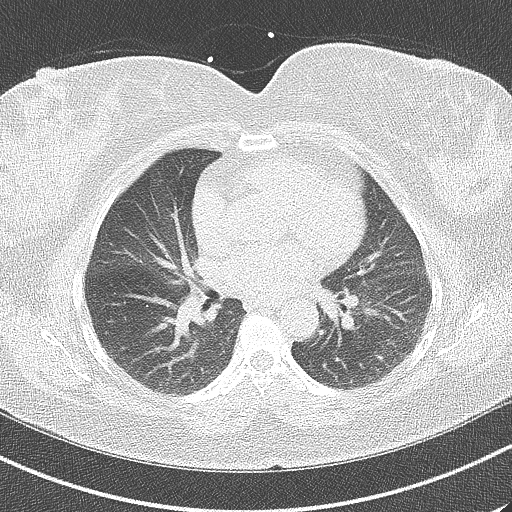
[im 29/35  lung]
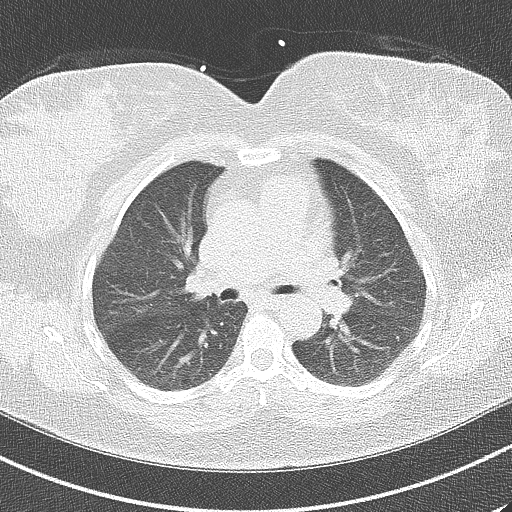

[Series 4: lung st 66 % · axial · 0.65mm/px · z∈[-198,-128]mm · 5 of 35 slices shown]
[im 6/35  lung]
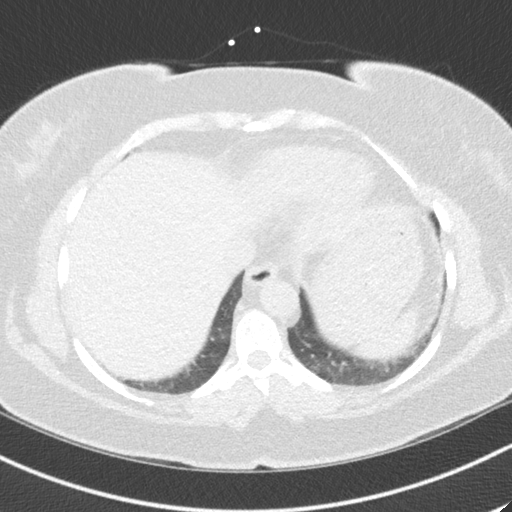
[im 12/35  lung]
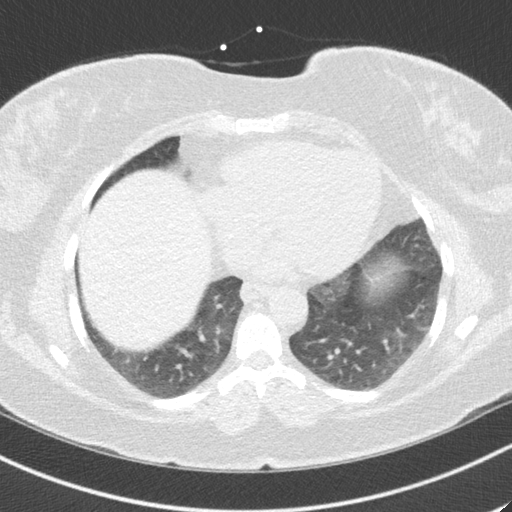
[im 18/35  lung]
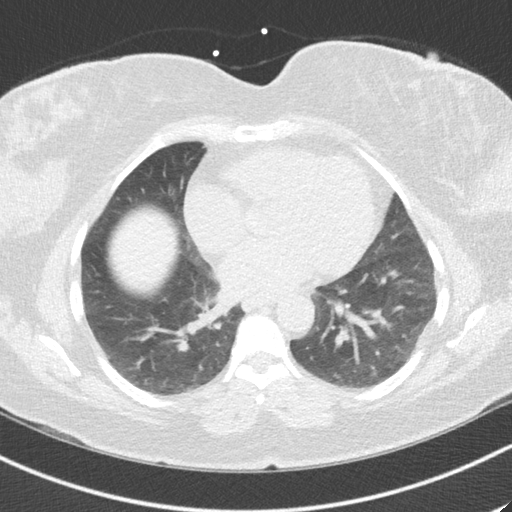
[im 23/35  lung]
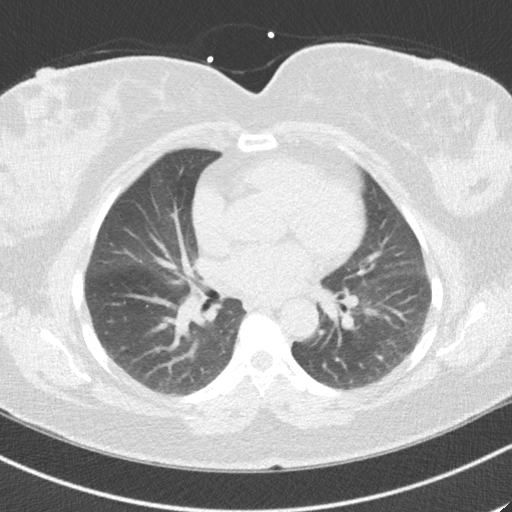
[im 29/35  lung]
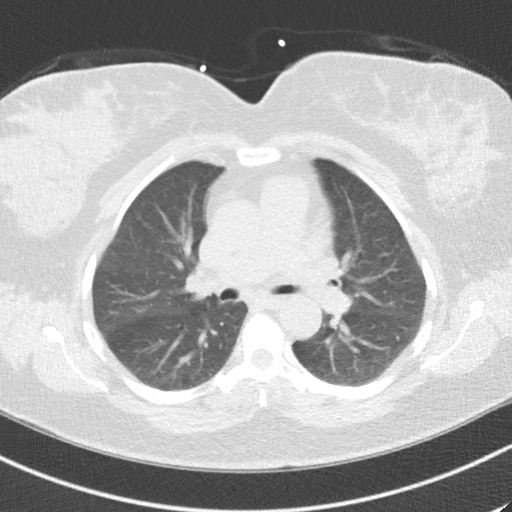

[14 of 20 positions shown; findings below may reference images not displayed]

FINDINGS: Vascular: Aortic atherosclerosis.

Mediastinum/Nodes: No imaged thoracic adenopathy.

Lungs/Pleura: No pleural fluid. 2 mm right lower lobe pulmonary
nodule on [DATE].

Upper Abdomen: Normal imaged portions of the liver, spleen, stomach.

Musculoskeletal: No acute osseous abnormality.
IMPRESSION: 1.  No acute findings in the imaged extracardiac chest.
2.  Aortic Atherosclerosis (4VPZ7-DWH.H).
3. 2 mm right lower lobe pulmonary nodule. No follow-up needed if
patient is low-risk. Non-contrast chest CT can be considered in 12
months if patient is high-risk. This recommendation follows the
consensus statement: Guidelines for Management of Incidental
Pulmonary Nodules Detected on CT Images: From the [REDACTED]AL DATA:  Risk stratification

EXAM:
Coronary Calcium Score
FINDINGS: Non-cardiac: See separate report from [REDACTED].

Ascending Aorta: Normal size

Pericardium: Normal

Coronary arteries: Calcium score 0
IMPRESSION: Coronary calcium score of 0.

*** End of Addendum ***
EXAM:
OVER-READ INTERPRETATION  CT CHEST

The following report is an over-read performed by radiologist Dr.
Yordanis Thigpen [REDACTED] on 05/26/2020. This over-read
does not include interpretation of cardiac or coronary anatomy or
pathology. The calcium score interpretation by the cardiologist is
attached.
FINDINGS: Vascular: Aortic atherosclerosis.

Mediastinum/Nodes: No imaged thoracic adenopathy.

Lungs/Pleura: No pleural fluid. 2 mm right lower lobe pulmonary
nodule on [DATE].

Upper Abdomen: Normal imaged portions of the liver, spleen, stomach.

Musculoskeletal: No acute osseous abnormality.
IMPRESSION: 1.  No acute findings in the imaged extracardiac chest.
2.  Aortic Atherosclerosis (4VPZ7-DWH.H).
3. 2 mm right lower lobe pulmonary nodule. No follow-up needed if
patient is low-risk. Non-contrast chest CT can be considered in 12
months if patient is high-risk. This recommendation follows the
consensus statement: Guidelines for Management of Incidental
Pulmonary Nodules Detected on CT Images: From the [HOSPITAL]

## 2022-04-06 ENCOUNTER — Ambulatory Visit: Payer: Managed Care, Other (non HMO) | Admitting: Family Medicine

## 2022-04-06 ENCOUNTER — Encounter: Payer: Self-pay | Admitting: Family Medicine

## 2022-04-06 ENCOUNTER — Other Ambulatory Visit: Payer: Self-pay | Admitting: Family Medicine

## 2022-04-06 VITALS — BP 134/66 | HR 72 | Temp 96.8°F | Ht 63.0 in | Wt 175.0 lb

## 2022-04-06 DIAGNOSIS — F419 Anxiety disorder, unspecified: Secondary | ICD-10-CM

## 2022-04-06 DIAGNOSIS — E663 Overweight: Secondary | ICD-10-CM | POA: Insufficient documentation

## 2022-04-06 DIAGNOSIS — E559 Vitamin D deficiency, unspecified: Secondary | ICD-10-CM

## 2022-04-06 DIAGNOSIS — Z0000 Encounter for general adult medical examination without abnormal findings: Secondary | ICD-10-CM

## 2022-04-06 DIAGNOSIS — N3289 Other specified disorders of bladder: Secondary | ICD-10-CM | POA: Diagnosis not present

## 2022-04-06 DIAGNOSIS — N3281 Overactive bladder: Secondary | ICD-10-CM | POA: Diagnosis not present

## 2022-04-06 DIAGNOSIS — E538 Deficiency of other specified B group vitamins: Secondary | ICD-10-CM

## 2022-04-06 DIAGNOSIS — E669 Obesity, unspecified: Secondary | ICD-10-CM | POA: Diagnosis not present

## 2022-04-06 DIAGNOSIS — R7303 Prediabetes: Secondary | ICD-10-CM

## 2022-04-06 DIAGNOSIS — M797 Fibromyalgia: Secondary | ICD-10-CM

## 2022-04-06 DIAGNOSIS — E039 Hypothyroidism, unspecified: Secondary | ICD-10-CM

## 2022-04-06 DIAGNOSIS — E782 Mixed hyperlipidemia: Secondary | ICD-10-CM

## 2022-04-06 LAB — POCT GLYCOSYLATED HEMOGLOBIN (HGB A1C): Hemoglobin A1C: 6.4 % — AB (ref 4.0–5.6)

## 2022-04-06 MED ORDER — ZEPBOUND 5 MG/0.5ML ~~LOC~~ SOAJ: 5.0000 mg | SUBCUTANEOUS | 0 refills | Status: DC

## 2022-04-06 NOTE — Patient Instructions (Addendum)
Thank you for coming to the office today.  Referral sent to Urology, stay tuned for apt. If not heard back, call them or [place].  Hopefully they can get the Myrbetriq 50mg  approved!  [facility] -[address]st floor [place],  [place]  [address] Phone: [phone]  A1c today follow-up Previous was 6.0 (06/2021)  Start free sample Mounjaro 2.5mg  weekly inj for 4 weeks  New order in we will get it approved.  Please schedule a Follow-up Appointment to: Return in about 3 months (around 07/06/2022) for 3 month fasting lab only then 1 week later Annual Physical.  If you have any other questions or concerns, please feel free to call the office or send a message through [place]. You may also schedule an earlier appointment if necessary.  Additionally, you may be receiving a survey about your experience at our office within a few days to 1 week by e-mail or mail. We value your feedback.  Nobie Putnam, DO [place]

## 2022-04-06 NOTE — Progress Notes (Signed)
Subjective:    Patient ID: Christine Berger, female    DOB: 11-06-1960, 62 y.o.   MRN: [id number]  Christine Berger is a 62 y.o. female presenting on 04/06/2022 for Obesity   HPI  Pre Diabetes Obesity BMI >31 Last lab A1c 6.0 (06/2021) Due for repeat today Concern weight gain. Difficulty losing weight despite diet and exercise regimen. Increased tired / sleepiness, not waking up overnight poor sleep, just feels more sluggish  History of GERD / Silent Reflux Previously seen by me 03/2021 Does not seem to be active problem  Overactive Bladder Bladder Spasms Previously followed w/ Urology, now no longer seen. Still has med Taking Myrbetriq 50mg  daily AS NEEDED. Not using every day.      07/12/2021    8:24 AM 04/05/2021    3:41 PM 12/04/2020    3:09 PM  Depression screen PHQ 2/9  Decreased Interest 1 0 1  Down, Depressed, Hopeless 0 1 0  PHQ - 2 Score 1 1 1   Altered sleeping 1 1 0  Tired, decreased energy 3 3 2   Change in appetite 1 2 1   Feeling bad or failure about yourself  1 1 0  Trouble concentrating 0 1 2  Moving slowly or fidgety/restless 0 0 0  Suicidal thoughts 0 0 0  PHQ-9 Score 7 9 6   Difficult doing work/chores Somewhat difficult Somewhat difficult Not difficult at all    Social History   Tobacco Use   Smoking status: Never   Smokeless tobacco: Never  Vaping Use   Vaping Use: Never used  Substance Use Topics   Alcohol use: Yes    Alcohol/week: 1.0 - 2.0 standard drink of alcohol    Types: 1 - 2 Glasses of wine per week    Comment: monthly   Drug use: No    Review of Systems Per HPI unless specifically indicated above     Objective:    BP 134/66 (BP Location: Right Arm, Patient Position: Sitting, Cuff Size: Normal)   Pulse 72   Temp (!) 96.8 F (36 C) (Temporal)   Ht 5\' 3"  (1.6 m)   Wt 175 lb (79.4 kg)   SpO2 97%   BMI 31.00 kg/m   Wt Readings from Last 3 Encounters:  04/06/22 175 lb (79.4 kg)  08/18/21 168 lb 12.8 oz (76.6 kg)   07/12/21 170 lb (77.1 kg)    Physical Exam Vitals and nursing note reviewed.  Constitutional:      General: She is not in acute distress.    Appearance: Normal appearance. She is well-developed. She is obese. She is not diaphoretic.     Comments: Well-appearing, comfortable, cooperative  HENT:     Head: Normocephalic and atraumatic.  Eyes:     General:        Right eye: No discharge.        Left eye: No discharge.     Conjunctiva/sclera: Conjunctivae normal.  Cardiovascular:     Rate and Rhythm: Normal rate.  Pulmonary:     Effort: Pulmonary effort is normal.  Skin:    General: Skin is warm and dry.     Findings: No erythema or rash.  Neurological:     Mental Status: She is alert and oriented to person, place, and time.  Psychiatric:        Mood and Affect: Mood normal.        Behavior: Behavior normal.        Thought Content: Thought content normal.  Comments: Well groomed, good eye contact, normal speech and thoughts       Results for orders placed or performed in visit on 04/06/22  POCT glycosylated hemoglobin (Hb A1C)  Result Value Ref Range   Hemoglobin A1C 6.4 (A) 4.0 - 5.6 %      Assessment & Plan:   Problem List Items Addressed This Visit     OAB (overactive bladder)   Relevant Orders   Ambulatory referral to Urology   Obesity (BMI 30.0-34.9) - Primary   Relevant Medications   ZEPBOUND 5 MG/0.5ML Pen   Pre-diabetes   Relevant Orders   POCT glycosylated hemoglobin (Hb A1C) (Completed)   Other Visit Diagnoses     Bladder spasms       Relevant Orders   Ambulatory referral to Urology       OAB Bladder Spasms Urinary dysfunction Previously managed by Urology Improved on Myrbetriq 50mg  AS NEEDED Failed other medications Continue current Myrbetriq now as she has enough available. Referral sent to Urology, stay tuned for apt. If not heard back, call them or [place].  [facility] -[address]st floor [place],  [place]  [address] Phone: [phone]  Obesity BMI >31 Pre Diabetes Elevated A1c up to 6.4 now, likely major contributing factor for fatigue tired and not feeling as well  Unable to lose weight successfully with diet lifestyle exercise. No prior weight loss medications tried before. But given elevated sugar worsening symptoms, will pursue GLP therapy for wt loss.  Start free sample Mounjaro 2.5mg  weekly inj for 4 weeks off label use starting dose Rx Zepbound 5mg  weekly injection, will work on PA   Orders Placed This Encounter  Procedures   Ambulatory referral to Urology    Referral Priority:   Routine    Referral Type:   Consultation    Referral Reason:   Specialty Services Required    Requested Specialty:   Urology    Number of Visits Requested:   1   POCT glycosylated hemoglobin (Hb A1C)     Meds ordered this encounter  Medications   ZEPBOUND 5 MG/0.5ML Pen    Sig: Inject 5 mg into the skin once a week.    Dispense:  2 mL    Refill:  0    Follow up plan: Return in about 3 months (around 07/06/2022) for 3 month fasting lab only then 1 week later Annual Physical.  Future labs ordered for 07/06/22   Christine Berger, [facility] 04/06/2022, 1:33 PM

## 2022-04-12 ENCOUNTER — Encounter: Payer: Self-pay | Admitting: Family Medicine

## 2022-04-12 DIAGNOSIS — E669 Obesity, unspecified: Secondary | ICD-10-CM

## 2022-04-14 MED ORDER — ZEPBOUND 5 MG/0.5ML ~~LOC~~ SOAJ: 5.0000 mg | SUBCUTANEOUS | 0 refills | Status: DC

## 2022-04-14 NOTE — Addendum Note (Signed)
Addended by: Olin Hauser on: 04/14/2022 02:30 PM   Modules accepted: Orders

## 2022-04-29 ENCOUNTER — Encounter: Payer: Self-pay | Admitting: Family Medicine

## 2022-04-29 DIAGNOSIS — E669 Obesity, unspecified: Secondary | ICD-10-CM

## 2022-05-03 MED ORDER — MOUNJARO 2.5 MG/0.5ML ~~LOC~~ SOAJ: 2.5000 mg | SUBCUTANEOUS | 0 refills | Status: DC

## 2022-05-06 ENCOUNTER — Ambulatory Visit: Payer: Managed Care, Other (non HMO) | Admitting: Urology

## 2022-05-06 ENCOUNTER — Encounter: Payer: Self-pay | Admitting: Urology

## 2022-05-06 VITALS — BP 117/72 | HR 73 | Ht 62.0 in | Wt 163.0 lb

## 2022-05-06 DIAGNOSIS — N3281 Overactive bladder: Secondary | ICD-10-CM

## 2022-05-06 LAB — BLADDER SCAN AMB NON-IMAGING: Scan Result: 0

## 2022-05-06 MED ORDER — MIRABEGRON ER 50 MG PO TB24: 50.0000 mg | ORAL_TABLET | Freq: Every day | ORAL | 11 refills | Status: DC

## 2022-05-06 NOTE — Progress Notes (Signed)
05/06/2022 1:33 PM   Christine Berger 1960-08-14 [id number]  Referring provider: Olin Hauser, DO [address] [place],  [place] [address]  Chief Complaint  Patient presents with   Over Active Bladder    HPI: Free Christine Berger is a 62 y.o. female referred for evaluation and management of overactive bladder.  Diagnosed with overactive bladder over 10 years ago.  Was seen at [facility] in [place] and was taking Myrbetriq 50 mg.  Last urology visit ~ 8 years ago She was taking Myrbetriq as needed but due to increased symptoms of frequency, urgency and urge incontinence started taking daily with good efficacy.  Recently ran out of medication and states urology referral was recommended for additional refills Denies dysuria, gross hematuria No flank pain Prior pubovaginal sling in 2013   PMH: Past Medical History:  Diagnosis Date   Allergy    Anxiety    Asthma    Depression    Family history of premature CAD 06/24/2021   Coronary Calcium Score 05/26/2020: Calcium score 0.  Aortic atherosclerosis noted.   Fibromyalgia    Glaucoma    Hyperlipidemia    Neuropathy    folloewd pcp   Osteoporosis    Sleep apnea    On CPAP   Thyroid disease     Surgical History: Past Surgical History:  Procedure Laterality Date   INCONTINENCE SURGERY  10/27/2011   bladder sling   TRANSTHORACIC ECHOCARDIOGRAM  06/04/2020   EF 55 to 60%.  No RWMA.  GR 1 DD with mild LA dilation.  Normal mitral and aortic valves.  Normal RV size and function.  Normal RVP and RAP.  (Normal)   uterine ablation  04/28/2010    Home Medications:  Allergies as of 05/06/2022       Reactions   Aspartame Cough, Shortness Of Breath   Penicillins    Hives and tightness in chest   Erythromycin Nausea And Vomiting   Milk Protein Other (See Comments)   Statins Other (See Comments)   Muscle aches in past on pravastatin, Crestor, Zocor, Lipitor - while in [place] Muscle aches in past on pravastatin,  Crestor, Zocor, Lipitor - while in [place]        Medication List        Accurate as of May 06, 2022  1:33 PM. If you have any questions, ask your nurse or doctor.          STOP taking these medications    predniSONE 10 MG tablet Commonly known as: DELTASONE Stopped by: Abbie Sons, MD   terbinafine 250 MG tablet Commonly known as: LAMISIL Stopped by: Abbie Sons, MD       TAKE these medications    albuterol 108 (90 Base) MCG/ACT inhaler Commonly known as: VENTOLIN HFA INHALE 2 PUFFS INTO THE LUNGS EVERY 4 HOURS AS NEEDED FOR WHEEZING, SHORTNESS OF BREATH, OR COUGH   buPROPion 150 MG 24 hr tablet Commonly known as: WELLBUTRIN XL TAKE 1 TABLET BY MOUTH EVERY DAY   busPIRone 10 MG tablet Commonly known as: BUSPAR Take 1 tablet (10 mg total) by mouth 2 (two) times daily as needed.   DULoxetine 60 MG capsule Commonly known as: CYMBALTA Take 1 capsule (60 mg total) by mouth daily.   eletriptan 40 MG tablet Commonly known as: RELPAX Take 40 mg by mouth as needed for migraine or headache (USE AS DIRECTED). May repeat in 2 hours if headache persists or recurs.   estradiol 2 MG  tablet Commonly known as: ESTRACE Take 2 mg by mouth daily.   ezetimibe 10 MG tablet Commonly known as: ZETIA TAKE 1 TABLET BY MOUTH EVERY DAY **NEED APPT**   fluticasone 50 MCG/ACT nasal spray Commonly known as: FLONASE Place 2 sprays into both nostrils daily as needed for allergies.   levothyroxine 75 MCG tablet Commonly known as: SYNTHROID TAKE 1 TABLET BY MOUTH EVERY DAY   methocarbamol 500 MG tablet Commonly known as: ROBAXIN Take 500 mg by mouth 3 times/day as needed-between meals & bedtime for muscle spasms.   metroNIDAZOLE 0.75 % cream Commonly known as: METROCREAM Apply 1 application topically daily.   mirabegron ER 25 MG Tb24 tablet Commonly known as: MYRBETRIQ Take 25 mg by mouth as needed.   montelukast 10 MG tablet Commonly known as: SINGULAIR TAKE  1 TABLET BY MOUTH EVERYDAY AT BEDTIME   Mounjaro 2.5 MG/0.5ML Pen Generic drug: tirzepatide Inject 2.5 mg into the skin once a week.   omeprazole 40 MG capsule Commonly known as: PRILOSEC Take 1 capsule (40 mg total) by mouth 2 (two) times daily before a meal.   Pitavastatin Calcium 4 MG Tabs Commonly known as: Livalo Take 1 tablet (4 mg total) by mouth daily.   Premarin vaginal cream Generic drug: conjugated estrogens Premarin 0.625 mg/gram vaginal cream  INSERT 0.5 G TWICE A WEEK BY VAGINAL ROUTE AS NEEDED FOR 30 DAYS.   progesterone 100 MG capsule Commonly known as: PROMETRIUM Take 100 mg by mouth daily.   Zepbound 5 MG/0.5ML Pen Generic drug: tirzepatide Inject 5 mg into the skin once a week.        Allergies:  Allergies  Allergen Reactions   Aspartame Cough and Shortness Of Breath   Penicillins     Hives and tightness in chest   Erythromycin Nausea And Vomiting   Milk Protein Other (See Comments)   Statins Other (See Comments)    Muscle aches in past on pravastatin, Crestor, Zocor, Lipitor - while in [place] Muscle aches in past on pravastatin, Crestor, Zocor, Lipitor - while in [place]    Family History: Family History  Problem Relation Age of Onset   Stroke Mother    Diabetes Mother    Cancer Mother    Anuerysm Mother    Depression Mother    Heart attack Father    Arthritis Sister    Crohn's disease Sister    Breast cancer Neg Hx     Social History:  reports that she has never smoked. She has never used smokeless tobacco. She reports current alcohol use of about 1.0 - 2.0 standard drink of alcohol per week. She reports that she does not use drugs.   Physical Exam: BP 117/72   Pulse 73   Ht 5' 2"$  (1.575 m)   Wt 163 lb (73.9 kg)   BMI 29.81 kg/m   Constitutional:  Alert and oriented, No acute distress. HEENT: [place] AT Respiratory: Normal respiratory effort, no increased work of breathing. Psychiatric: Normal mood and affect.  Laboratory  Data:  Urinalysis Dipstick/microscopy negative   Assessment & Plan:    1. OAB (overactive bladder) PVR 0 mL Rx Myrbetriq 50 mg sent to pharmacy Was given samples to start in the event she needs prior authorization Annual follow-up or earlier for worsening symptoms   Abbie Sons, MD  [facility] [address], [place],  [address] [phone]

## 2022-05-07 LAB — URINALYSIS, COMPLETE
Bilirubin, UA: NEGATIVE
Glucose, UA: NEGATIVE
Ketones, UA: NEGATIVE
Leukocytes,UA: NEGATIVE
Nitrite, UA: NEGATIVE
Protein,UA: NEGATIVE
Specific Gravity, UA: 1.01 (ref 1.005–1.030)
Urobilinogen, Ur: 0.2 mg/dL (ref 0.2–1.0)
pH, UA: 7 (ref 5.0–7.5)

## 2022-05-07 LAB — MICROSCOPIC EXAMINATION

## 2022-05-11 ENCOUNTER — Other Ambulatory Visit: Payer: Self-pay | Admitting: Family Medicine

## 2022-05-11 DIAGNOSIS — F3341 Major depressive disorder, recurrent, in partial remission: Secondary | ICD-10-CM

## 2022-05-11 NOTE — Telephone Encounter (Signed)
Requested Prescriptions  Pending Prescriptions Disp Refills   buPROPion (WELLBUTRIN XL) 150 MG 24 hr tablet [Pharmacy Med Name: BUPROPION HCL XL 150 MG TABLET] 90 tablet 0    Sig: TAKE 1 TABLET BY MOUTH EVERY DAY     Psychiatry: Antidepressants - bupropion Passed - 05/11/2022  2:48 AM      Passed - Cr in normal range and within 360 days    Creat  Date Value Ref Range Status  07/12/2021 0.78 0.50 - 1.03 mg/dL Final         Passed - AST in normal range and within 360 days    AST  Date Value Ref Range Status  07/12/2021 21 10 - 35 U/L Final         Passed - ALT in normal range and within 360 days    ALT  Date Value Ref Range Status  07/12/2021 22 6 - 29 U/L Final         Passed - Completed PHQ-2 or PHQ-9 in the last 360 days      Passed - Last BP in normal range    BP Readings from Last 1 Encounters:  05/06/22 117/72         Passed - Valid encounter within last 6 months    Recent Outpatient Visits           1 month ago Obesity (BMI 30.0-34.9)   [facility] [place], Devonne Doughty, DO   8 months ago Acute non-recurrent frontal sinusitis   [facility] Olin Hauser, DO   10 months ago Annual physical exam   [facility] Olin Hauser, DO   1 year ago Laryngopharyngeal reflux (LPR)   [facility] Olin Hauser, DO   1 year ago Acute non-recurrent frontal sinusitis   [facility] [place], Coralie Keens, NP       Future Appointments             In 2 months [place] Ranger, Devonne Doughty, DO [facility], [place]   In 1 year Beaver, Ronda Fairly, MD [place]

## 2022-06-24 ENCOUNTER — Other Ambulatory Visit: Payer: Self-pay | Admitting: Cardiology

## 2022-07-02 ENCOUNTER — Ambulatory Visit
Admission: EM | Admit: 2022-07-02 | Discharge: 2022-07-02 | Disposition: A | Discharge status: Home / Self Care | Payer: Managed Care, Other (non HMO) | Attending: Emergency Medicine | Admitting: Emergency Medicine

## 2022-07-02 DIAGNOSIS — J069 Acute upper respiratory infection, unspecified: Secondary | ICD-10-CM | POA: Diagnosis present

## 2022-07-02 DIAGNOSIS — Z1152 Encounter for screening for COVID-19: Secondary | ICD-10-CM | POA: Diagnosis not present

## 2022-07-02 DIAGNOSIS — R0989 Other specified symptoms and signs involving the circulatory and respiratory systems: Secondary | ICD-10-CM

## 2022-07-02 LAB — SARS CORONAVIRUS 2 BY RT PCR: SARS Coronavirus 2 by RT PCR: NEGATIVE

## 2022-07-02 LAB — GROUP A STREP BY PCR: Group A Strep by PCR: NOT DETECTED

## 2022-07-02 MED ORDER — PREDNISONE 10 MG (21) PO TBPK: ORAL_TABLET | ORAL | 0 refills | Status: DC

## 2022-07-02 MED ORDER — DEXAMETHASONE SODIUM PHOSPHATE 10 MG/ML IJ SOLN
10.0000 mg | Freq: Once | INTRAMUSCULAR | Status: AC
  Administered 2022-07-02: 10 mg via INTRAMUSCULAR

## 2022-07-02 MED ORDER — IPRATROPIUM BROMIDE 0.06 % NA SOLN: 2.0000 | Freq: Four times a day (QID) | NASAL | 12 refills | Status: DC

## 2022-07-02 NOTE — ED Triage Notes (Signed)
Pt states she is having min SOB

## 2022-07-02 NOTE — ED Provider Notes (Signed)
[facility]    CSN: [id number] Arrival date & time: 07/02/22  7209      History   Chief Complaint Chief Complaint  Patient presents with   Sore Throat   Facial Pain    HPI Christine Berger is a 62 y.o. female.   HPI  62 year old female with a significant past medical history for asthma, anxiety, fibromyalgia, hyperlipidemia, sleep apnea, and glaucoma presenting for evaluation of a sore throat.  She states that it started being itchy yesterday and it was swollen and sore when she woke up this morning.  She states that the swelling is what woke her up.  She has taken Xyzal and ibuprofen as she does have environmental allergies.  She also use Flonase last night.  She has had some nasal congestion with clear nasal discharge.  She denies fever, ear pain, cough, or rashes.  No new foods, medications, supplements, or personal hygiene products that she is aware of.  Past Medical History:  Diagnosis Date   Allergy    Anxiety    Asthma    Depression    Family history of premature CAD 06/24/2021   Coronary Calcium Score 05/26/2020: Calcium score 0.  Aortic atherosclerosis noted.   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  No RWMA.  GR 1 DD with mild LA dilation.  Normal mitral and aortic valves.  Normal RV size and function.  Normal RVP and RAP.  (Normal)   uterine ablation  04/28/2010    OB History   No obstetric history on file.      Home Medications    Prior to Admission medications   Medication Sig Start Date End Date Taking? Authorizing Provider  albuterol (VENTOLIN HFA) 108 (90 Base) MCG/ACT inhaler INHALE 2 PUFFS INTO THE LUNGS EVERY 4 HOURS AS NEEDED FOR WHEEZING, SHORTNESS OF BREATH, OR COUGH 12/01/21  Yes Karamalegos, Alexander J, DO  buPROPion (WELLBUTRIN XL) 150 MG 24 hr tablet TAKE 1 TABLET BY MOUTH EVERY DAY 05/11/22  Yes Karamalegos, Netta Neat, DO  busPIRone (BUSPAR) 10 MG tablet Take 1 tablet (10 mg total) by mouth 2 (two) times daily as needed. 03/23/22  Yes Karamalegos, Netta Neat, DO  conjugated estrogens (PREMARIN) vaginal cream Premarin 0.625 mg/gram vaginal cream  INSERT 0.5 G TWICE A WEEK BY VAGINAL ROUTE AS NEEDED FOR 30 DAYS.   Yes [provider]  DULoxetine (CYMBALTA) 60 MG capsule Take 1 capsule (60 mg total) by mouth daily. 03/07/22  Yes Karamalegos, Netta Neat, DO  eletriptan (RELPAX) 40 MG tablet Take 40 mg by mouth as needed for migraine  or headache (USE AS DIRECTED). May repeat in 2 hours if headache persists or recurs.   Yes [provider]  estradiol (ESTRACE) 2 MG tablet Take 2 mg by mouth daily.  04/16/16  Yes [provider]  ezetimibe (ZETIA) 10 MG tablet Take 1 tablet (10 mg total) by mouth daily. PATIENT NEED TO SCHEDULE ANNUAL APPOINTMENT FOR FUTURE REFILLS 06/24/22  Yes Pricilla Riffle, MD  fluticasone [facility]) 50 MCG/ACT nasal spray Place 2 sprays into both nostrils daily as needed for allergies.   Yes [provider]  ipratropium (ATROVENT) 0.06 % nasal spray Place 2 sprays into both nostrils 4 (four) times daily. 07/02/22  Yes Becky Augusta, NP  levothyroxine  (SYNTHROID) 75 MCG tablet TAKE 1 TABLET BY MOUTH EVERY DAY 02/10/22  Yes Karamalegos, Netta Neat, DO  methocarbamol (ROBAXIN) 500 MG tablet Take 500 mg by mouth 3 times/day as needed-between meals & bedtime for muscle spasms. 03/14/20  Yes [provider]  metroNIDAZOLE (METROCREAM) 0.75 % cream Apply 1 application topically daily. 01/26/21  Yes [provider]  mirabegron ER (MYRBETRIQ) 50 MG TB24 tablet Take 1 tablet (50 mg total) by mouth daily. 05/06/22  Yes Stoioff, Verna Czech, MD  montelukast (SINGULAIR) 10 MG tablet TAKE 1 TABLET BY MOUTH EVERYDAY AT BEDTIME 10/19/21  Yes Karamalegos, Netta Neat, DO  omeprazole (PRILOSEC) 40 MG capsule Take 1 capsule (40 mg total) by mouth 2 (two) times daily before a meal. 07/12/21  Yes Karamalegos, Netta Neat, DO  Pitavastatin Calcium (LIVALO) 4 MG TABS Take 1 tablet (4 mg total) by mouth daily. 02/14/22  Yes Marykay Lex, MD  predniSONE (STERAPRED UNI-PAK 21 TAB) 10 MG (21) TBPK tablet Take 6 tablets on day 1, 5 tablets day 2, 4 tablets day 3, 3 tablets day 4, 2 tablets day 5, 1 tablet day 6 07/02/22  Yes Becky Augusta, NP  progesterone (PROMETRIUM) 100 MG capsule Take 100 mg by mouth daily.  04/16/16  Yes [provider]    Family History Family History  Problem Relation Age of Onset   Stroke Mother    Diabetes Mother    Cancer Mother    Anuerysm Mother    Depression Mother    Heart attack Father    Arthritis Sister    Crohn's disease Sister    Breast cancer Neg Hx     Social History Social History   Tobacco Use   Smoking status: Never   Smokeless tobacco: Never  Vaping Use   Vaping Use: Never used  Substance Use Topics   Alcohol use: Yes    Alcohol/week: 1.0 - 2.0 standard drink of alcohol    Types: 1 - 2 Glasses of wine per week    Comment: monthly   Drug use: No     Allergies   Aspartame, Penicillins, Erythromycin, Milk protein, Other, and Statins   Review of Systems Review of Systems   Constitutional:  Negative for fever.  HENT:  Positive for congestion, rhinorrhea, sore throat and trouble swallowing. Negative for ear pain.   Respiratory:  Positive for shortness of breath. Negative for cough and wheezing.      Physical Exam Triage Vital Signs ED Triage Vitals  Enc Vitals Group     BP      Pulse      Resp      Temp      Temp src      SpO2      Weight      Height  Head Circumference      Peak Flow      Pain Score      Pain Loc      Pain Edu?      Excl. in GC?    No data found.  Updated Vital Signs BP 133/62 (BP Location: Right Arm)   Pulse 70   Temp 97.6 F (36.4 C) (Oral)   Resp 18   SpO2 98%   Visual Acuity Right Eye Distance:   Left Eye Distance:   Bilateral Distance:    Right Eye Near:   Left Eye Near:    Bilateral Near:     Physical Exam Vitals and nursing note reviewed.  Constitutional:      Appearance: Normal appearance. She is not ill-appearing.  HENT:     Head: Normocephalic and atraumatic.     Right Ear: Tympanic membrane, ear canal and external ear normal. There is no impacted cerumen.     Left Ear: Tympanic membrane, ear canal and external ear normal. There is no impacted cerumen.     Nose: Congestion and rhinorrhea present.     Comments: Nasal mucosa is erythematous and markedly edematous with clear discharge in both nares.    Mouth/Throat:     Mouth: Mucous membranes are moist.     Pharynx: Oropharynx is clear. Posterior oropharyngeal erythema present. No oropharyngeal exudate.     Comments: Bilateral tonsillar pillars, uvula, and soft palate are erythematous.  Tonsillar pillars are 2+ edematous but free of exudate.  Her airway is patent and I can see the posterior oropharynx. Neck:     Comments: Patient has fullness to the anterior cervical region but no appreciable lymphadenopathy.  This fullness is tender to palpation. Cardiovascular:     Rate and Rhythm: Normal rate and regular rhythm.     Pulses: Normal pulses.      Heart sounds: Normal heart sounds. No murmur heard.    No friction rub. No gallop.  Pulmonary:     Effort: Pulmonary effort is normal.     Breath sounds: Normal breath sounds. No stridor. No wheezing, rhonchi or rales.  Musculoskeletal:     Cervical back: Normal range of motion and neck supple. Tenderness present.  Lymphadenopathy:     Cervical: No cervical adenopathy.  Skin:    General: Skin is warm and dry.     Capillary Refill: Capillary refill takes less than 2 seconds.     Findings: No rash.  Neurological:     General: No focal deficit present.     Mental Status: She is alert and oriented to person, place, and time.      UC Treatments / Results  Labs (all labs ordered are listed, but only abnormal results are displayed) Labs Reviewed  GROUP A STREP BY PCR  SARS CORONAVIRUS 2 BY RT PCR    EKG   Radiology No results found.  Procedures Procedures (including critical care time)  Medications Ordered in UC Medications  dexamethasone (DECADRON) injection 10 mg (10 mg Intramuscular Given 07/02/22 0938)    Initial Impression / Assessment and Plan / UC Course  I have reviewed the triage vital signs and the nursing notes.  Pertinent labs & imaging results that were available during my care of the patient were reviewed by me and considered in my medical decision making (see chart for details).   Patient is a pleasant, nontoxic-appearing 62 year old female presenting for evaluation predominantly of a sore throat.  She states that it feels swollen and the  swelling is what woke her up from sleep.  She is able to speak in full sentences without dyspnea or tachypnea and her voice is not muffled.  She does have inflammation of bilateral tonsillar pillars and her soft palate with associated erythema but no exudate.  No anterior cervical lymphadenopathy though the anterior cervical region does have a fullness to it that is tender.  No stridor when auscultating over the trachea and her  lung sounds are clear to auscultation all fields.  The patient is concerned that she may be having allergic reaction though she is not sure to what.  She does have seasonal allergies and she takes Xyzal for them.  She took Xyzal this morning.  She also used Flonase last night.  On exam her nasal mucosa and oropharyngeal mucosa are edematous and erythematous.  I will order ER.  Due to the patient's swelling and discomfort her throat I will also administer 10 mg of IM Decadron here in clinic.  Her symptoms may possibly be from a viral URI.  I do not suspect influenza as she has not had a fever but I will order a COVID PCR.  Group A strep is negative.  COVID PCR is negative.  I feel the patient's symptoms are more a result of a viral respiratory infection than an allergic reaction.  However, I will treat her throat tightness with a prednisone taper that she can start tomorrow morning.  We have given her 10 mg of Decadron in clinic.  I will have her continue to utilize her Xyzal during the day, and Benadryl at night.  Pepcid 20 mg twice daily.  Offer histamine control.  I will also prescribe her Atrovent nasal spray that she can use for the nasal congestion that she can do 2 squirts in each nostril every 6 hours.  Return precautions reviewed.   Final Clinical Impressions(s) / UC Diagnoses   Final diagnoses:  Upper respiratory tract infection, unspecified type  Throat tightness     Discharge Instructions      You tested negative today for both strep and COVID.  Your symptoms may still be the result of a respiratory infection given your congestion and redness to the back of your throat.  This may also be the result of an allergic response though we do not know what the causative agent is.  We have given you an injection of steroids to help with swelling in your throat today in clinic and I am going to prescribe you a prednisone pack that you will take over the next 6 days.  Started tomorrow morning  at breakfast time and take it each morning at breakfast for 6 days.  For control of your allergy symptoms you may continue your Xyzal during the day and take Benadryl at night.  You may also continue to use your Flonase at nighttime.  I want you to add over-the-counter Pepcid 20 mg twice daily to your regimen of medications for the next 6 days.  This will also help with the histamine response which can be causing your throat tightness.  I am going to prescribe Atrovent nasal spray that you can do 2 squirts in each nostril every 6 hours to help with the nasal congestion as well.  If you develop any further tightness in your throat, difficulty swallowing, wheezing, increased feelings of shortness of breath, or swelling of your lips or tongue you need to call 911 and go to the ER.     ED Prescriptions  Medication Sig Dispense Auth. Provider   ipratropium (ATROVENT) 0.06 % nasal spray Place 2 sprays into both nostrils 4 (four) times daily. 15 mL Becky Augusta, NP   predniSONE (STERAPRED UNI-PAK 21 TAB) 10 MG (21) TBPK tablet Take 6 tablets on day 1, 5 tablets day 2, 4 tablets day 3, 3 tablets day 4, 2 tablets day 5, 1 tablet day 6 21 tablet Becky Augusta, NP      PDMP not reviewed this encounter.   Becky Augusta, NP 07/02/22 1024

## 2022-07-02 NOTE — ED Triage Notes (Signed)
Pt states that she thinks she may be having an allergic reaction. Throat was itchy yesterday. Woke up this morning throat hurting and swollen. Took Xyzal and IBU. Last IBU was 630 this morning. Sinus are swollen.

## 2022-07-02 NOTE — Discharge Instructions (Addendum)
You tested negative today for both strep and COVID.  Your symptoms may still be the result of a respiratory infection given your congestion and redness to the back of your throat.  This may also be the result of an allergic response though we do not know what the causative agent is.  We have given you an injection of steroids to help with swelling in your throat today in clinic and I am going to prescribe you a prednisone pack that you will take over the next 6 days.  Started tomorrow morning at breakfast time and take it each morning at breakfast for 6 days.  For control of your allergy symptoms you may continue your Xyzal during the day and take Benadryl at night.  You may also continue to use your Flonase at nighttime.  I want you to add over-the-counter Pepcid 20 mg twice daily to your regimen of medications for the next 6 days.  This will also help with the histamine response which can be causing your throat tightness.  I am going to prescribe Atrovent nasal spray that you can do 2 squirts in each nostril every 6 hours to help with the nasal congestion as well.  If you develop any further tightness in your throat, difficulty swallowing, wheezing, increased feelings of shortness of breath, or swelling of your lips or tongue you need to call 911 and go to the ER.

## 2022-07-05 ENCOUNTER — Other Ambulatory Visit: Payer: Self-pay

## 2022-07-05 DIAGNOSIS — E538 Deficiency of other specified B group vitamins: Secondary | ICD-10-CM

## 2022-07-05 DIAGNOSIS — E559 Vitamin D deficiency, unspecified: Secondary | ICD-10-CM

## 2022-07-05 DIAGNOSIS — E782 Mixed hyperlipidemia: Secondary | ICD-10-CM

## 2022-07-05 DIAGNOSIS — Z0000 Encounter for general adult medical examination without abnormal findings: Secondary | ICD-10-CM

## 2022-07-05 DIAGNOSIS — R7303 Prediabetes: Secondary | ICD-10-CM

## 2022-07-05 DIAGNOSIS — E039 Hypothyroidism, unspecified: Secondary | ICD-10-CM

## 2022-07-06 ENCOUNTER — Other Ambulatory Visit: Payer: Managed Care, Other (non HMO)

## 2022-07-06 LAB — CBC WITH DIFFERENTIAL/PLATELET
Eosinophils Relative: 3.8 %
Lymphs Abs: 2767 cells/uL (ref 850–3900)
MCV: 91.2 fL (ref 80.0–100.0)
RBC: 4.57 10*6/uL (ref 3.80–5.10)
WBC: 8.7 10*3/uL (ref 3.8–10.8)

## 2022-07-07 LAB — COMPLETE METABOLIC PANEL WITH GFR
AG Ratio: 1.6 (calc) (ref 1.0–2.5)
ALT: 45 U/L — ABNORMAL HIGH (ref 6–29)
AST: 36 U/L — ABNORMAL HIGH (ref 10–35)
Albumin: 3.9 g/dL (ref 3.6–5.1)
Alkaline phosphatase (APISO): 52 U/L (ref 37–153)
BUN: 16 mg/dL (ref 7–25)
CO2: 33 mmol/L — ABNORMAL HIGH (ref 20–32)
Calcium: 9 mg/dL (ref 8.6–10.4)
Chloride: 99 mmol/L (ref 98–110)
Creat: 0.88 mg/dL (ref 0.50–1.05)
Globulin: 2.4 g/dL (calc) (ref 1.9–3.7)
Glucose, Bld: 83 mg/dL (ref 65–99)
Potassium: 4 mmol/L (ref 3.5–5.3)
Sodium: 140 mmol/L (ref 135–146)
Total Bilirubin: 0.5 mg/dL (ref 0.2–1.2)
Total Protein: 6.3 g/dL (ref 6.1–8.1)
eGFR: 75 mL/min/{1.73_m2} (ref >=60)

## 2022-07-07 LAB — LIPID PANEL
Cholesterol: 189 mg/dL (ref <200)
HDL: 75 mg/dL (ref >=50)
LDL Cholesterol (Calc): 87 mg/dL (calc)
Non-HDL Cholesterol (Calc): 114 mg/dL (calc) (ref <130)
Total CHOL/HDL Ratio: 2.5 (calc) (ref <5.0)
Triglycerides: 169 mg/dL — ABNORMAL HIGH (ref <150)

## 2022-07-07 LAB — T4, FREE: Free T4: 1 ng/dL (ref 0.8–1.8)

## 2022-07-07 LAB — CBC WITH DIFFERENTIAL/PLATELET
Absolute Monocytes: 766 cells/uL (ref 200–950)
Basophils Absolute: 44 cells/uL (ref 0–200)
Basophils Relative: 0.5 %
Eosinophils Absolute: 331 cells/uL (ref 15–500)
HCT: 41.7 % (ref 35.0–45.0)
Hemoglobin: 13.7 g/dL (ref 11.7–15.5)
MCH: 30 pg (ref 27.0–33.0)
MCHC: 32.9 g/dL (ref 32.0–36.0)
MPV: 10.5 fL (ref 7.5–12.5)
Monocytes Relative: 8.8 %
Neutro Abs: 4794 cells/uL (ref 1500–7800)
Neutrophils Relative %: 55.1 %
Platelets: 269 10*3/uL (ref 140–400)
RDW: 12.6 % (ref 11.0–15.0)
Total Lymphocyte: 31.8 %

## 2022-07-07 LAB — HEMOGLOBIN A1C
Hgb A1c MFr Bld: 6.3 % of total Hgb — ABNORMAL HIGH (ref <5.7)
Mean Plasma Glucose: 134 mg/dL
eAG (mmol/L): 7.4 mmol/L

## 2022-07-07 LAB — VITAMIN B12: Vitamin B-12: 604 pg/mL (ref 200–1100)

## 2022-07-07 LAB — TSH: TSH: 4.91 mIU/L — ABNORMAL HIGH (ref 0.40–4.50)

## 2022-07-07 LAB — VITAMIN D 25 HYDROXY (VIT D DEFICIENCY, FRACTURES): Vit D, 25-Hydroxy: 46 ng/mL (ref 30–100)

## 2022-07-08 ENCOUNTER — Ambulatory Visit (INDEPENDENT_AMBULATORY_CARE_PROVIDER_SITE_OTHER): Payer: Managed Care, Other (non HMO)

## 2022-07-08 ENCOUNTER — Encounter: Payer: Self-pay | Admitting: Emergency Medicine

## 2022-07-08 ENCOUNTER — Ambulatory Visit
Admission: EM | Admit: 2022-07-08 | Discharge: 2022-07-08 | Disposition: A | Discharge status: Home / Self Care | Payer: Managed Care, Other (non HMO) | Attending: Emergency Medicine | Admitting: Emergency Medicine

## 2022-07-08 DIAGNOSIS — J069 Acute upper respiratory infection, unspecified: Secondary | ICD-10-CM | POA: Diagnosis not present

## 2022-07-08 DIAGNOSIS — R059 Cough, unspecified: Secondary | ICD-10-CM

## 2022-07-08 MED ORDER — AZITHROMYCIN 250 MG PO TABS: 250.0000 mg | ORAL_TABLET | Freq: Every day | ORAL | 0 refills | Status: DC

## 2022-07-08 MED ORDER — PREDNISONE 20 MG PO TABS: 40.0000 mg | ORAL_TABLET | Freq: Every day | ORAL | 0 refills | Status: DC

## 2022-07-08 MED ORDER — GUAIFENESIN-CODEINE 100-10 MG/5ML PO SOLN: 5.0000 mL | Freq: Four times a day (QID) | ORAL | 0 refills | Status: DC | PRN

## 2022-07-08 MED ORDER — BENZONATATE 100 MG PO CAPS: 100.0000 mg | ORAL_CAPSULE | Freq: Three times a day (TID) | ORAL | 0 refills | Status: DC

## 2022-07-08 NOTE — ED Triage Notes (Signed)
Patient was seen here on Saturday for URI.  Patient states that she was told to come back if her cough does not improve.  Patient states that she has more congestion and her cough has worsen.  Patient unsure of fevers.

## 2022-07-08 NOTE — ED Provider Notes (Signed)
[facility]    CSN: [id number] Arrival date & time: 07/08/22  1120      History   Chief Complaint Chief Complaint  Patient presents with   Cough    HPI Landa Blonnie Maske is a 62 y.o. female.   Patient presents for evaluation of nasal congestion, rhinorrhea, productive cough, shortness of breath at rest and chest tightness beginning 7 days ago.  Initially began as a sore throat which she was evaluated for 6 days prior.  Endorses symptoms are worsening.  Has attempted use of Claritin, D, Tessalon and Delsym as well as prescribed prednisone course with no improvement.  History of asthma, inhaler usage.  Non-smoker.  Denies wheezing.  Past Medical History:  Diagnosis Date   Allergy    Anxiety    Asthma    Depression    Family history of premature CAD 06/24/2021   Coronary Calcium Score 05/26/2020: Calcium score 0.  Aortic atherosclerosis noted.   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  No RWMA.  GR 1 DD with mild LA dilation.  Normal mitral and aortic valves.  Normal RV size and function.  Normal RVP and RAP.  (Normal)   uterine ablation   04/28/2010    OB History   No obstetric history on file.      Home Medications    Prior to Admission medications   Medication Sig Start Date End Date Taking? Authorizing Provider  albuterol (VENTOLIN HFA) 108 (90 Base) MCG/ACT inhaler INHALE 2 PUFFS INTO THE LUNGS EVERY 4 HOURS AS NEEDED FOR WHEEZING, SHORTNESS OF BREATH, OR COUGH 12/01/21   Althea Charon, Netta Neat, DO  buPROPion (WELLBUTRIN XL) 150 MG 24 hr tablet TAKE 1 TABLET BY MOUTH EVERY DAY 05/11/22   Karamalegos, Netta Neat, DO  busPIRone (BUSPAR) 10 MG tablet Take 1 tablet (10 mg total) by mouth 2 (two) times daily as needed. 03/23/22   Karamalegos, Netta Neat, DO  conjugated estrogens (PREMARIN) vaginal cream Premarin 0.625 mg/gram vaginal cream  INSERT 0.5 G TWICE A WEEK BY VAGINAL ROUTE AS NEEDED FOR 30 DAYS.    [provider]  DULoxetine (CYMBALTA) 60 MG capsule Take 1 capsule (60 mg total) by mouth daily. 03/07/22   Karamalegos, Netta Neat, DO  eletriptan (RELPAX) 40 MG tablet Take 40 mg by mouth as needed for migraine or headache (USE AS DIRECTED). May repeat in 2 hours if headache persists or recurs.    [provider]  estradiol (ESTRACE) 2 MG tablet Take 2 mg by mouth daily.  04/16/16   [provider]  ezetimibe (ZETIA) 10 MG tablet  Take 1 tablet (10 mg total) by mouth daily. PATIENT NEED TO SCHEDULE ANNUAL APPOINTMENT FOR FUTURE REFILLS 06/24/22   Pricilla Riffle, MD  fluticasone [facility]) 50 MCG/ACT nasal spray Place 2 sprays into both nostrils daily as needed for allergies.    [provider]  ipratropium (ATROVENT) 0.06 % nasal spray Place 2 sprays into both nostrils 4 (four) times daily. 07/02/22   Becky Augusta, NP  levothyroxine (SYNTHROID) 75 MCG tablet TAKE 1 TABLET BY MOUTH EVERY DAY 02/10/22   Karamalegos, Netta Neat, DO  methocarbamol (ROBAXIN) 500 MG tablet Take 500 mg by mouth 3 times/day as needed-between meals & bedtime for muscle spasms. 03/14/20   [provider]   metroNIDAZOLE (METROCREAM) 0.75 % cream Apply 1 application topically daily. 01/26/21   [provider]  mirabegron ER (MYRBETRIQ) 50 MG TB24 tablet Take 1 tablet (50 mg total) by mouth daily. 05/06/22   Stoioff, Verna Czech, MD  montelukast (SINGULAIR) 10 MG tablet TAKE 1 TABLET BY MOUTH EVERYDAY AT BEDTIME 10/19/21   Karamalegos, Netta Neat, DO  omeprazole (PRILOSEC) 40 MG capsule Take 1 capsule (40 mg total) by mouth 2 (two) times daily before a meal. 07/12/21   Karamalegos, Netta Neat, DO  Pitavastatin Calcium (LIVALO) 4 MG TABS Take 1 tablet (4 mg total) by mouth daily. 02/14/22   Marykay Lex, MD  predniSONE (STERAPRED UNI-PAK 21 TAB) 10 MG (21) TBPK tablet Take 6 tablets on day 1, 5 tablets day 2, 4 tablets day 3, 3 tablets day 4, 2 tablets day 5, 1 tablet day 6 07/02/22   Becky Augusta, NP  progesterone (PROMETRIUM) 100 MG capsule Take 100 mg by mouth daily.  04/16/16   [provider]    Family History Family History  Problem Relation Age of Onset   Stroke Mother    Diabetes Mother    Cancer Mother    Anuerysm Mother    Depression Mother    Heart attack Father    Arthritis Sister    Crohn's disease Sister    Breast cancer Neg Hx     Social History Social History   Tobacco Use   Smoking status: Never   Smokeless tobacco: Never  Vaping Use   Vaping Use: Never used  Substance Use Topics   Alcohol use: Yes    Alcohol/week: 1.0 - 2.0 standard drink of alcohol    Types: 1 - 2 Glasses of wine per week    Comment: monthly   Drug use: No     Allergies   Aspartame, Penicillins, Erythromycin, Milk protein, Other, and Statins   Review of Systems Review of Systems  Constitutional: Negative.   HENT:  Positive for congestion, rhinorrhea and sore throat. Negative for dental problem, drooling, ear discharge, ear pain, facial swelling, hearing loss, mouth sores, nosebleeds, postnasal drip, sinus pressure, sinus pain, sneezing, tinnitus, trouble swallowing and voice  change.   Respiratory:  Positive for cough, chest tightness and shortness of breath. Negative for apnea, choking, wheezing and stridor.   Cardiovascular: Negative.   Gastrointestinal: Negative.   Neurological: Negative.      Physical Exam Triage Vital Signs ED Triage Vitals  Enc Vitals Group     BP 07/08/22 1131 123/74     Pulse Rate 07/08/22 1131 70     Resp 07/08/22 1131 14     Temp 07/08/22 1131 98.3 F (36.8 C)     Temp Source 07/08/22 1131 Oral     SpO2 07/08/22 1131 95 %  Weight 07/08/22 1129 162 lb 14.7 oz (73.9 kg)     Height 07/08/22 1129 5\' 2"  (1.575 m)     Head Circumference --      Peak Flow --      Pain Score 07/08/22 1129 0     Pain Loc --      Pain Edu? --      Excl. in GC? --    No data found.  Updated Vital Signs BP 123/74 (BP Location: Left Arm)   Pulse 70   Temp 98.3 F (36.8 C) (Oral)   Resp 14   Ht 5\' 2"  (1.575 m)   Wt 162 lb 14.7 oz (73.9 kg)   SpO2 95%   BMI 29.80 kg/m   Visual Acuity Right Eye Distance:   Left Eye Distance:   Bilateral Distance:    Right Eye Near:   Left Eye Near:    Bilateral Near:     Physical Exam Constitutional:      Appearance: Normal appearance.  HENT:     Head: Normocephalic.     Right Ear: Tympanic membrane, ear canal and external ear normal.     Left Ear: Tympanic membrane, ear canal and external ear normal.     Nose: Congestion present. No rhinorrhea.     Mouth/Throat:     Mouth: Mucous membranes are moist.     Pharynx: No posterior oropharyngeal erythema.  Eyes:     Extraocular Movements: Extraocular movements intact.  Cardiovascular:     Rate and Rhythm: Normal rate and regular rhythm.     Pulses: Normal pulses.     Heart sounds: Normal heart sounds.  Pulmonary:     Effort: Pulmonary effort is normal.     Breath sounds: Normal breath sounds.  Skin:    General: Skin is warm and dry.  Neurological:     Mental Status: She is alert and oriented to person, place, and time. Mental status is at  baseline.  Psychiatric:        Mood and Affect: Mood normal.        Behavior: Behavior normal.      UC Treatments / Results  Labs (all labs ordered are listed, but only abnormal results are displayed) Labs Reviewed - No data to display  EKG   Radiology DG Chest 2 View  Result Date: 07/08/2022 CLINICAL DATA:  Cough. EXAM: CHEST - 2 VIEW COMPARISON:  Two-view chest x-ray 03/17/2021 FINDINGS: The heart size and mediastinal contours are within normal limits. Both lungs are clear. The visualized skeletal structures are unremarkable. IMPRESSION: Negative two view chest x-ray Electronically Signed   By: Marin Roberts M.D.   On: 07/08/2022 11:55    Procedures Procedures (including critical care time)  Medications Ordered in UC Medications - No data to display  Initial Impression / Assessment and Plan / UC Course  I have reviewed the triage vital signs and the nursing notes.  Pertinent labs & imaging results that were available during my care of the patient were reviewed by me and considered in my medical decision making (see chart for details).  Acute Upper Respiratory infection  Patient is in no signs of distress nor toxic appearing.  Vital signs are stable.  Viral testing deferred due to timeline.  Chest x-ray negative, discussed with patient, most likely illnesses flaring asthma.  Prescribed Z-Pak, Tessalon, prednisone and guaifenesin codeine.  PDMP reviewed, low risk.May use additional over-the-counter medications as needed for supportive care.  May follow-up with urgent care as  needed if symptoms persist or worsen.        Final Clinical Impressions(s) / UC Diagnoses   Final diagnoses:  Acute upper respiratory infection     Discharge Instructions      Symptoms are most likely being caused by a viral illness but last year worsening provide coverage for bacteria  Chest x-ray is normal  Begin azithromycin as directed  Begin prednisone every morning with food  for 5 days to help relax the airway  May use Tessalon pill every 8 hours as needed to help calm your cough You can take Tylenol and/or Ibuprofen as needed for fever reduction and pain relief.   For cough: honey 1/2 to 1 teaspoon (you can dilute the honey in water or another fluid).  You can also use guaifenesin and dextromethorphan for cough. You can use a humidifier for chest congestion and cough.  If you don't have a humidifier, you can sit in the bathroom with the hot shower running.      For sore throat: try warm salt water gargles, cepacol lozenges, throat spray, warm tea or water with lemon/honey, popsicles or ice, or OTC cold relief medicine for throat discomfort.   For congestion: take a daily anti-histamine like Zyrtec, Claritin, and a oral decongestant, such as pseudoephedrine.  You can also use Flonase 1-2 sprays in each nostril daily.   It is important to stay hydrated: drink plenty of fluids (water, gatorade/powerade/pedialyte, juices, or teas) to keep your throat moisturized and help further relieve irritation/discomfort.    ED Prescriptions   None    PDMP not reviewed this encounter.   Valinda Hoar, NP 07/08/22 1351

## 2022-07-08 NOTE — Discharge Instructions (Addendum)
Symptoms are most likely being caused by a viral illness but last year worsening provide coverage for bacteria  Chest x-ray is normal  Begin azithromycin as directed  Begin prednisone every morning with food for 5 days to help relax the airway  May use Tessalon pill every 8 hours as needed to help calm your cough  May use guaifenesin codeine every 6 hours as needed for additional comfort, be mindful of this will make you sleepy You can take Tylenol and/or Ibuprofen as needed for fever reduction and pain relief.   For cough: honey 1/2 to 1 teaspoon (you can dilute the honey in water or another fluid).  You can also use guaifenesin and dextromethorphan for cough. You can use a humidifier for chest congestion and cough.  If you don't have a humidifier, you can sit in the bathroom with the hot shower running.      For sore throat: try warm salt water gargles, cepacol lozenges, throat spray, warm tea or water with lemon/honey, popsicles or ice, or OTC cold relief medicine for throat discomfort.   For congestion: take a daily anti-histamine like Zyrtec, Claritin, and a oral decongestant, such as pseudoephedrine.  You can also use Flonase 1-2 sprays in each nostril daily.   It is important to stay hydrated: drink plenty of fluids (water, gatorade/powerade/pedialyte, juices, or teas) to keep your throat moisturized and help further relieve irritation/discomfort.

## 2022-07-13 ENCOUNTER — Ambulatory Visit (INDEPENDENT_AMBULATORY_CARE_PROVIDER_SITE_OTHER): Payer: Managed Care, Other (non HMO) | Admitting: Family Medicine

## 2022-07-13 ENCOUNTER — Other Ambulatory Visit: Payer: Self-pay | Admitting: Family Medicine

## 2022-07-13 ENCOUNTER — Encounter: Payer: Self-pay | Admitting: Family Medicine

## 2022-07-13 VITALS — BP 124/60 | HR 75 | Ht 62.0 in | Wt 168.0 lb

## 2022-07-13 DIAGNOSIS — R7303 Prediabetes: Secondary | ICD-10-CM

## 2022-07-13 DIAGNOSIS — E782 Mixed hyperlipidemia: Secondary | ICD-10-CM | POA: Diagnosis not present

## 2022-07-13 DIAGNOSIS — F3341 Major depressive disorder, recurrent, in partial remission: Secondary | ICD-10-CM

## 2022-07-13 DIAGNOSIS — G4733 Obstructive sleep apnea (adult) (pediatric): Secondary | ICD-10-CM

## 2022-07-13 DIAGNOSIS — M79672 Pain in left foot: Secondary | ICD-10-CM

## 2022-07-13 DIAGNOSIS — Z0000 Encounter for general adult medical examination without abnormal findings: Secondary | ICD-10-CM | POA: Diagnosis not present

## 2022-07-13 DIAGNOSIS — E039 Hypothyroidism, unspecified: Secondary | ICD-10-CM

## 2022-07-13 DIAGNOSIS — M79671 Pain in right foot: Secondary | ICD-10-CM

## 2022-07-13 DIAGNOSIS — Z1211 Encounter for screening for malignant neoplasm of colon: Secondary | ICD-10-CM

## 2022-07-13 DIAGNOSIS — I700 Atherosclerosis of aorta: Secondary | ICD-10-CM

## 2022-07-13 DIAGNOSIS — R7989 Other specified abnormal findings of blood chemistry: Secondary | ICD-10-CM

## 2022-07-13 DIAGNOSIS — K219 Gastro-esophageal reflux disease without esophagitis: Secondary | ICD-10-CM

## 2022-07-13 MED ORDER — LEVOTHYROXINE SODIUM 75 MCG PO TABS
75.0000 ug | ORAL_TABLET | Freq: Every day | ORAL | 3 refills | Status: DC
Start: 2022-07-13 — End: 2023-08-01

## 2022-07-13 MED ORDER — OMEPRAZOLE 40 MG PO CPDR: 40.0000 mg | DELAYED_RELEASE_CAPSULE | Freq: Every day | ORAL | 1 refills | Status: DC

## 2022-07-13 MED ORDER — BUPROPION HCL ER (XL) 150 MG PO TB24: 150.0000 mg | ORAL_TABLET | Freq: Every day | ORAL | 3 refills | Status: DC

## 2022-07-13 NOTE — Patient Instructions (Addendum)
Thank you for coming to the office today.  Okay to use the Ozempic through the program  Lab for TSH slightly elevated, but not a concern. T4 is normal. Keep your current dose.  Cholesterol improved, so keep on current medications.  Liver enzymes slightly elevated, unsure exactly why, but can be related to medications. We can monitor going forward.  ----------------  I believe the sinus / throat symptoms are improving  Start nasal steroid Flonase 2 sprays in each nostril daily for 4-6 weeks, may repeat course seasonally or as needed Keep on Atrovent nasal as needed for congestion ONLY Use Albuterol as needed Keep Claritin D - or can phase down to regular claritin Keep SIngulair Okay to take ibuprofen for sore throat  Likely throat is just raw / irritated from cough and drainage. No further sign of infection.  DUE for FASTING BLOOD WORK (no food or drink after midnight before the lab appointment, only water or coffee without cream/sugar on the morning of)  SCHEDULE "Lab Only" visit in the morning at the clinic for lab draw in 6 MONTHS   - Make sure Lab Only appointment is at about 1 week before your next appointment, so that results will be available  For Lab Results, once available within 2-3 days of blood draw, you can can log in to MyChart online to view your results and a brief explanation. Also, we can discuss results at next follow-up visit.   Please schedule a Follow-up Appointment to: Return in about 6 months (around 01/12/2023) for 6 month fasting lab only then 1 week later Follow-up PreDM results, Weight, Liver enzymes.  If you have any other questions or concerns, please feel free to call the office or send a message through MyChart. You may also schedule an earlier appointment if necessary.  Additionally, you may be receiving a survey about your experience at our office within a few days to 1 week by e-mail or mail. We value your feedback.  Saralyn Pilar,  DO [facility] [place], [place]

## 2022-07-13 NOTE — Progress Notes (Signed)
Subjective:    Patient ID: Christine Berger, female    DOB: 11-06-1960, 62 y.o.   MRN: [id number]  Christine Berger is a 62 y.o. female presenting on 07/13/2022 for Annual Exam   HPI  Here for Annual Physical and Lab Review  Pre Diabetes Obesity BMI >31 Past history was on Mounjaro and then switched to Ozempic through an online program Last lab A1c 6.3 (06/2022) prior result 6.4 Due for repeat today Concern weight gain. Difficulty losing weight despite diet and exercise regimen. Increased tired / sleepiness, not waking up overnight poor sleep, just feels more sluggish  Podiatry referral chronic bilateral foot pain   History of GERD / Silent Reflux Previously seen by me 03/2021 Does not seem to be active problem   Overactive Bladder Bladder Spasms Previously followed w/ Urology, now no longer seen. Still has med Taking Myrbetriq  daily AS NEEDED. Not using every day.  HYPERLIPIDEMIA: Followed by Cardiologist - Reports no concerns. Last lipid panel 2024 controlled - Currently taking Zetia and Livalo, tolerating well without side effects or myalgias   Hypothyroidism Last lab TSH slight elevated 4.9 but normal Free T4 1.0 On Levothyroxine daily   Major Recurrent Depression, in partial remission Anxiety Doing well on current medication   Vitamin B12 Deficiency Vitamin D Deficiency Lab results with Vitamin D and B12 are normal range.    OSA on CPAP - Patient reports prior history of dx OSA and on CPAP for years, prior to treatment initial symptoms were snoring, daytime sleepiness and fatigue, has had several sleep studies in the past. - Today reports that sleep apnea is well controlled. She uses the CPAP machine every night. Tolerates the machine well, and thinks that sleeps better with it and feels good. No new concerns or symptoms. - Previously she was advised by Sleep specialist saying that she was sleeping too much.   Acute Pharyngitis /  Sinusitis Initial onset 07/02/22 UC Visit with sore throat swelling, thought to have viral infection and allergic reaction, no coughing. She was tested for rapid strep negative, viral swab negative, she was given dexamethasone, then discharged with prednisone and atrovent. She improved but then had more coughing. Next visit 07/08/22 Urgent care, determined that she had viral infection that exacerbated her asthma triggering the coughing. 2nd round treatment with Zpak and Prednisone repeat taper, she has had improvement but still having sore throat irritation. Still taking Atrovent with good results, has Flonase but not  started yet Takes Singulair, Claritin D, and occasional Benadryl She has Albuterol AS NEEDED uses at night Not on maintenance for asthma, has not needed it before  Health Maintenance:  Next Cologuard 12/2022, 3 years since last one. Ordered today, they will ship in October  Next mammogram due yearly, 12/2021, she will relocate to new GYN     07/13/2022    1:49 PM 07/12/2021    8:24 AM 04/05/2021    3:41 PM  Depression screen PHQ 2/9  Decreased Interest 0 1 0  Down, Depressed, Hopeless 0 0 1  PHQ - 2 Score 0 1 1  Altered sleeping 0 1 1  Tired, decreased energy 0 3 3  Change in appetite 0 1 2  Feeling bad or failure about yourself  0 1 1  Trouble concentrating 0 0 1  Moving slowly or fidgety/restless 0 0 0  Suicidal thoughts 0 0 0  PHQ-9 Score 0 7 9  Difficult doing work/chores  Somewhat difficult Somewhat difficult  Past Medical History:  Diagnosis Date   Allergy    Anxiety    Asthma    Depression    Family history of premature CAD 06/24/2021   Coronary Calcium Score 05/26/2020: Calcium score 0.  Aortic atherosclerosis noted.   Fibromyalgia    Glaucoma    Hyperlipidemia    Neuropathy    folloewd pcp   Osteoporosis    Sleep apnea    On CPAP   Thyroid disease    Past Surgical History:  Procedure Laterality Date   INCONTINENCE SURGERY  10/27/2011   bladder  sling   TRANSTHORACIC ECHOCARDIOGRAM  06/04/2020   EF 55 to 60%.  No RWMA.  GR 1 DD with mild LA dilation.  Normal mitral and aortic valves.  Normal RV size and function.  Normal RVP and RAP.  (Normal)   uterine ablation  04/28/2010   Social History   Socioeconomic History   Marital status: Married    Spouse name: Not on file   Number of children: Not on file   Years of education: Not on file   Highest education level: Not on file  Occupational History   Not on file  Tobacco Use   Smoking status: Never   Smokeless tobacco: Never  Vaping Use   Vaping Use: Never used  Substance and Sexual Activity   Alcohol use: Yes    Alcohol/week: 1.0 - 2.0 standard drink of alcohol    Types: 1 - 2 Glasses of wine per week    Comment: monthly   Drug use: No   Sexual activity: Not on file  Other Topics Concern   Not on file  Social History Narrative   Not on file   Social Determinants of Health   Financial Resource Strain: Not on file  Food Insecurity: Not on file  Transportation Needs: Not on file  Physical Activity: Not on file  Stress: Not on file  Social Connections: Not on file  Intimate Partner Violence: Not on file   Family History  Problem Relation Age of Onset   Stroke Mother    Diabetes Mother    Cancer Mother    Anuerysm Mother    Depression Mother    Heart attack Father    Arthritis Sister    Crohn's disease Sister    Breast cancer Neg Hx    Current Outpatient Medications on File Prior to Visit  Medication Sig   albuterol (VENTOLIN HFA) 108 (90 Base) MCG/ACT inhaler INHALE 2 PUFFS INTO THE LUNGS EVERY 4 HOURS AS NEEDED FOR WHEEZING, SHORTNESS OF BREATH, OR COUGH   busPIRone (BUSPAR) 10 MG tablet Take 1 tablet (10 mg total) by mouth 2 (two) times daily as needed.   conjugated estrogens (PREMARIN) vaginal cream Premarin 0.625 mg/gram vaginal cream  INSERT 0.5 G TWICE A WEEK BY VAGINAL ROUTE AS NEEDED FOR 30 DAYS.   desloratadine-pseudoephedrine (CLARINEX-D 12-HOUR)  2.5-120 MG 12 hr tablet Take 1 tablet by mouth 2 (two) times daily.   DULoxetine (CYMBALTA) 60 MG capsule Take 1 capsule (60 mg total) by mouth daily.   eletriptan (RELPAX) 40 MG tablet Take 40 mg by mouth as needed for migraine or headache (USE AS DIRECTED). May repeat in 2 hours if headache persists or recurs.   estradiol (ESTRACE) 2 MG tablet Take 2 mg by mouth daily.    ezetimibe (ZETIA) 10 MG tablet Take 1 tablet (10 mg total) by mouth daily. PATIENT NEED TO SCHEDULE ANNUAL APPOINTMENT FOR FUTURE REFILLS   fluticasone (FLONASE)  50 MCG/ACT nasal spray Place 2 sprays into both nostrils daily as needed for allergies.   ipratropium (ATROVENT) 0.06 % nasal spray Place 2 sprays into both nostrils 4 (four) times daily.   methocarbamol (ROBAXIN) 500 MG tablet Take 500 mg by mouth 3 times/day as needed-between meals & bedtime for muscle spasms.   metroNIDAZOLE (METROCREAM) 0.75 % cream Apply 1 application topically daily.   mirabegron ER (MYRBETRIQ) 50 MG TB24 tablet Take 1 tablet (50 mg total) by mouth daily.   montelukast (SINGULAIR) 10 MG tablet TAKE 1 TABLET BY MOUTH EVERYDAY AT BEDTIME   Pitavastatin Calcium (LIVALO) 4 MG TABS Take 1 tablet (4 mg total) by mouth daily.   progesterone (PROMETRIUM) 100 MG capsule Take 100 mg by mouth daily.    No current facility-administered medications on file prior to visit.    Review of Systems  Constitutional:  Negative for activity change, appetite change, chills, diaphoresis, fatigue and fever.  HENT:  Negative for congestion and hearing loss.   Eyes:  Negative for visual disturbance.  Respiratory:  Negative for cough, chest tightness, shortness of breath and wheezing.   Cardiovascular:  Negative for chest pain, palpitations and leg swelling.  Gastrointestinal:  Negative for abdominal pain, constipation, diarrhea, nausea and vomiting.  Genitourinary:  Negative for dysuria, frequency and hematuria.  Musculoskeletal:  Negative for arthralgias and neck  pain.  Skin:  Negative for rash.  Neurological:  Negative for dizziness, weakness, light-headedness, numbness and headaches.  Hematological:  Negative for adenopathy.  Psychiatric/Behavioral:  Negative for behavioral problems, dysphoric mood and sleep disturbance.    Per HPI unless specifically indicated above      Objective:    BP 124/60   Pulse 75   Ht 5\' 2"  (1.575 m)   Wt 168 lb (76.2 kg)   BMI 30.73 kg/m   Wt Readings from Last 3 Encounters:  07/13/22 168 lb (76.2 kg)  07/08/22 162 lb 14.7 oz (73.9 kg)  05/06/22 163 lb (73.9 kg)    Physical Exam Vitals and nursing note reviewed.  Constitutional:      General: She is not in acute distress.    Appearance: She is well-developed. She is not diaphoretic.     Comments: Well-appearing, comfortable, cooperative  HENT:     Head: Normocephalic and atraumatic.  Eyes:     General:        Right eye: No discharge.        Left eye: No discharge.     Conjunctiva/sclera: Conjunctivae normal.     Pupils: Pupils are equal, round, and reactive to light.  Neck:     Thyroid: No thyromegaly.     Vascular: No carotid bruit.  Cardiovascular:     Rate and Rhythm: Normal rate and regular rhythm.     Pulses: Normal pulses.     Heart sounds: Normal heart sounds. No murmur heard. Pulmonary:     Effort: Pulmonary effort is normal. No respiratory distress.     Breath sounds: Normal breath sounds. No wheezing or rales.  Abdominal:     General: Bowel sounds are normal. There is no distension.     Palpations: Abdomen is soft. There is no mass.     Tenderness: There is no abdominal tenderness.  Musculoskeletal:        General: No tenderness. Normal range of motion.     Cervical back: Normal range of motion and neck supple.     Right lower leg: No edema.     Left lower  leg: No edema.     Comments: Upper / Lower Extremities: - Normal muscle tone, strength bilateral upper extremities 5/5, lower extremities 5/5  Lymphadenopathy:     Cervical:  No cervical adenopathy.  Skin:    General: Skin is warm and dry.     Findings: No erythema or rash.  Neurological:     Mental Status: She is alert and oriented to person, place, and time.     Comments: Distal sensation intact to light touch all extremities  Psychiatric:        Mood and Affect: Mood normal.        Behavior: Behavior normal.        Thought Content: Thought content normal.     Comments: Well groomed, good eye contact, normal speech and thoughts     I have personally reviewed the radiology report from 07/08/22 on CXR.   Study Result CLINICAL DATA: Cough.  EXAM: CHEST - 2 VIEW  COMPARISON: Two-view chest x-ray 03/17/2021  FINDINGS: The heart size and mediastinal contours are within normal limits. Both lungs are clear. The visualized skeletal structures are unremarkable.  IMPRESSION: Negative two view chest x-ray   Electronically Signed By: Marin Roberts M.D. On: 07/08/2022 11:55  Results for orders placed or performed in visit on 07/05/22  Vitamin B12  Result Value Ref Range   Vitamin B-12 604 200 - 1,100 pg/mL  VITAMIN D 25 Hydroxy (Vit-D Deficiency, Fractures)  Result Value Ref Range   Vit D, 25-Hydroxy 46 30 - 100 ng/mL  T4, free  Result Value Ref Range   Free T4 1.0 0.8 - 1.8 ng/dL  TSH  Result Value Ref Range   TSH 4.91 (H) 0.40 - 4.50 mIU/L  Hemoglobin A1c  Result Value Ref Range   Hgb A1c MFr Bld 6.3 (H) <5.7 % of total Hgb   Mean Plasma Glucose 134 mg/dL   eAG (mmol/L) 7.4 mmol/L  Lipid panel  Result Value Ref Range   Cholesterol 189 <200 mg/dL   HDL 75 > OR = 50 mg/dL   Triglycerides 161 (H) <150 mg/dL   LDL Cholesterol (Calc) 87 mg/dL (calc)   Total CHOL/HDL Ratio 2.5 <5.0 (calc)   Non-HDL Cholesterol (Calc) 114 <130 mg/dL (calc)  CBC with Differential/Platelet  Result Value Ref Range   WBC 8.7 3.8 - 10.8 Thousand/uL   RBC 4.57 3.80 - 5.10 Million/uL   Hemoglobin 13.7 11.7 - 15.5 g/dL   HCT 09.6 04.5 - 40.9 %   MCV 91.2  80.0 - 100.0 fL   MCH 30.0 27.0 - 33.0 pg   MCHC 32.9 32.0 - 36.0 g/dL   RDW 81.1 91.4 - 78.2 %   Platelets 269 140 - 400 Thousand/uL   MPV 10.5 7.5 - 12.5 fL   Neutro Abs 4,794 1,500 - 7,800 cells/uL   Lymphs Abs 2,767 850 - 3,900 cells/uL   Absolute Monocytes 766 200 - 950 cells/uL   Eosinophils Absolute 331 15 - 500 cells/uL   Basophils Absolute 44 0 - 200 cells/uL   Neutrophils Relative % 55.1 %   Total Lymphocyte 31.8 %   Monocytes Relative 8.8 %   Eosinophils Relative 3.8 %   Basophils Relative 0.5 %  COMPLETE METABOLIC PANEL WITH GFR  Result Value Ref Range   Glucose, Bld 83 65 - 99 mg/dL   BUN 16 7 - 25 mg/dL   Creat 9.56 2.13 - 0.86 mg/dL   eGFR 75 > OR = 60 VH/QIO/9.62X5   BUN/Creatinine Ratio SEE NOTE:  6 - 22 (calc)   Sodium 140 135 - 146 mmol/L   Potassium 4.0 3.5 - 5.3 mmol/L   Chloride 99 98 - 110 mmol/L   CO2 33 (H) 20 - 32 mmol/L   Calcium 9.0 8.6 - 10.4 mg/dL   Total Protein 6.3 6.1 - 8.1 g/dL   Albumin 3.9 3.6 - 5.1 g/dL   Globulin 2.4 1.9 - 3.7 g/dL (calc)   AG Ratio 1.6 1.0 - 2.5 (calc)   Total Bilirubin 0.5 0.2 - 1.2 mg/dL   Alkaline phosphatase (APISO) 52 37 - 153 U/L   AST 36 (H) 10 - 35 U/L   ALT 45 (H) 6 - 29 U/L      Assessment & Plan:   Problem List Items Addressed This Visit     Mixed hyperlipidemia (Chronic)   OSA on CPAP (Chronic)   Thoracic aortic atherosclerosis (Chronic)   Acquired hypothyroidism   Relevant Medications   levothyroxine (SYNTHROID) 75 MCG tablet   Laryngopharyngeal reflux (LPR)   Relevant Medications   omeprazole (PRILOSEC) 40 MG capsule   Major depressive disorder, recurrent episode, in partial remission   Relevant Medications   buPROPion (WELLBUTRIN XL) 150 MG 24 hr tablet   Pre-diabetes   Other Visit Diagnoses     Annual physical exam    -  Primary   Screening for colon cancer       Relevant Orders   Cologuard   Bilateral foot pain       Relevant Orders   Ambulatory referral to Podiatry      Updated  Health Maintenance information Reviewed recent lab results with patient Encouraged improvement to lifestyle with diet and exercise Goal of weight loss  Okay to use the Ozempic through the program  Lab for TSH slightly elevated, but not a concern. T4 is normal. Keep your current dose.  Cholesterol improved, so keep on current medications.  Liver enzymes slightly elevated, unsure exactly why, but can be related to medications. We can monitor going forward.  ----------------  I believe the sinus / throat symptoms are improving  Start nasal steroid Flonase 2 sprays in each nostril daily for 4-6 weeks, may repeat course seasonally or as needed Keep on Atrovent nasal as needed for congestion ONLY Use Albuterol as needed Keep Claritin D - or can phase down to regular claritin Keep SIngulair Okay to take ibuprofen for sore throat  Likely throat is just raw / irritated from cough and drainage. No further sign of infection.    Orders Placed This Encounter  Procedures   Cologuard   Ambulatory referral to Podiatry    Referral Priority:   Routine    Referral Type:   Consultation    Referral Reason:   Specialty Services Required    Requested Specialty:   Podiatry    Number of Visits Requested:   1     Meds ordered this encounter  Medications   buPROPion (WELLBUTRIN XL) 150 MG 24 hr tablet    Sig: Take 1 tablet (150 mg total) by mouth daily.    Dispense:  90 tablet    Refill:  3    Add refills   levothyroxine (SYNTHROID) 75 MCG tablet    Sig: Take 1 tablet (75 mcg total) by mouth daily.    Dispense:  90 tablet    Refill:  3    Add refills   omeprazole (PRILOSEC) 40 MG capsule    Sig: Take 1 capsule (40 mg total) by mouth daily before  breakfast.    Dispense:  90 capsule    Refill:  1     Follow up plan: Return in about 6 months (around 01/12/2023) for 6 month fasting lab only then 1 week later Follow-up PreDM results, Weight, Liver enzymes.  Future labs ordered 01/06/23  - CMET + A1c  Saralyn Pilar, DO [facility] 07/13/2022, 1:55 PM

## 2022-07-15 ENCOUNTER — Encounter: Payer: Self-pay | Admitting: Family Medicine

## 2022-08-09 ENCOUNTER — Ambulatory Visit (INDEPENDENT_AMBULATORY_CARE_PROVIDER_SITE_OTHER): Payer: Managed Care, Other (non HMO)

## 2022-08-09 ENCOUNTER — Telehealth: Payer: Self-pay

## 2022-08-09 ENCOUNTER — Ambulatory Visit: Payer: Managed Care, Other (non HMO) | Admitting: Podiatry

## 2022-08-09 DIAGNOSIS — M216X1 Other acquired deformities of right foot: Secondary | ICD-10-CM

## 2022-08-09 DIAGNOSIS — M79672 Pain in left foot: Secondary | ICD-10-CM

## 2022-08-09 DIAGNOSIS — M216X2 Other acquired deformities of left foot: Secondary | ICD-10-CM

## 2022-08-09 DIAGNOSIS — G5763 Lesion of plantar nerve, bilateral lower limbs: Secondary | ICD-10-CM | POA: Diagnosis not present

## 2022-08-09 DIAGNOSIS — M79671 Pain in right foot: Secondary | ICD-10-CM

## 2022-08-09 NOTE — Telephone Encounter (Signed)
Spoke with pharmacy and approved manufacturer change.

## 2022-08-09 NOTE — Telephone Encounter (Signed)
Copied from CRM [id number]. Topic: General - Other >> Aug 09, 2022  8:39 AM Carrielelia G wrote: Reason for CRM: authorization to change the manufacturer  for the levothyroxine (SYNTHROID) 75 MCG tablet

## 2022-08-09 NOTE — Progress Notes (Signed)
Chief Complaint  Patient presents with   Plantar Fasciitis    Patient came in today for arch and heel pain, started a few months ago, rate of pain 5 out of 10, X-Rays done today     HPI: 62 y.o. female presenting today as a new patient for evaluation of bilateral forefoot pain has been ongoing for over 10 years now.  Patient has developed pain and tenderness over the past several years to the forefoot.  She says that she has tried arch supports with heel lifts but she continues to have pain and tenderness to the forefoot.  This has been an ongoing chronic problem for over 10 years despite shoe gear modifications.  Past Medical History:  Diagnosis Date   Allergy    Anxiety    Asthma    Depression    Family history of premature CAD 06/24/2021   Coronary Calcium Score 05/26/2020: Calcium score 0.  Aortic atherosclerosis noted.   Fibromyalgia    Glaucoma    Hyperlipidemia    Neuropathy    folloewd pcp   Osteoporosis    Sleep apnea    On CPAP   Thyroid disease     Past Surgical History:  Procedure Laterality Date   INCONTINENCE SURGERY  10/27/2011   bladder sling   TRANSTHORACIC ECHOCARDIOGRAM  06/04/2020   EF 55 to 60%.  No RWMA.  GR 1 DD with mild LA dilation.  Normal mitral and aortic valves.  Normal RV size and function.  Normal RVP and RAP.  (Normal)   uterine ablation  04/28/2010    Allergies  Allergen Reactions   Aspartame Cough and Shortness Of Breath   Penicillins Other (See Comments)    Hives and tightness in chest   Erythromycin Nausea And Vomiting   Milk Protein Other (See Comments)   Other Other (See Comments)   Statins Other (See Comments)    Muscle aches in past on pravastatin, Crestor, Zocor, Lipitor - while in [place] Muscle aches in past on pravastatin, Crestor, Zocor, Lipitor - while in [place]     Physical Exam: General: The patient is alert and oriented x3 in no acute distress.  Dermatology: Skin is warm, dry and supple bilateral lower extremities.    Vascular: Palpable pedal pulses bilaterally. Capillary refill within normal limits.  No appreciable edema.  No erythema.  Neurological: Grossly intact via light touch  Musculoskeletal Exam: Limited ankle joint dorsiflexion.  Positive Silfverskiold test.  Findings consistent with a tight posterior complex and gastrocnemius equinus.  There is associated tenderness with palpation throughout the forefoot  Radiographic Exam B/L:  Normal osseous mineralization. Joint spaces preserved.  No fractures or osseous irregularities noted.  Assessment/Plan of Care: 1.  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 also apply metatarsal pad to offload the forefoot.  Order placed for orthotics -Return to clinic about 2 months after the orthotics for follow-up.  Ultimately the patient has been dealing with this for about 10 years and she may benefit from gastrocnemius aponeurosis lengthening/Baumann procedure if conservative treatment continues to fail      Felecia Shelling, DPM [facility]  Dr. Kipp Brood  Carver Fila, DPM    [address], [place] [address]                Office [phone]  Fax [phone]

## 2022-08-09 NOTE — Telephone Encounter (Signed)
Okay to authorize change to diff manufacturer  Saralyn Pilar, DO [facility] 08/09/2022, 11:50 AM

## 2022-08-10 ENCOUNTER — Encounter: Payer: Self-pay | Admitting: Podiatry

## 2022-08-16 ENCOUNTER — Other Ambulatory Visit: Payer: Self-pay | Admitting: Cardiology

## 2022-09-02 ENCOUNTER — Ambulatory Visit (INDEPENDENT_AMBULATORY_CARE_PROVIDER_SITE_OTHER): Payer: Managed Care, Other (non HMO) | Admitting: Podiatry

## 2022-09-02 DIAGNOSIS — M216X1 Other acquired deformities of right foot: Secondary | ICD-10-CM

## 2022-09-02 DIAGNOSIS — M216X2 Other acquired deformities of left foot: Secondary | ICD-10-CM

## 2022-09-02 DIAGNOSIS — G5763 Lesion of plantar nerve, bilateral lower limbs: Secondary | ICD-10-CM

## 2022-09-02 NOTE — Progress Notes (Signed)
Patient presents today to be measured for custom orthotics .  Patient was dispensed 1 pair of custom orthotics , patient is going to call back after she checks with her insurance company to see what they cover on the orthotics    I will hold scans for 60 days.   Re-appointment for regularly scheduled diabetic foot care visits or if they should experience any trouble with the shoes or insoles.

## 2022-09-15 ENCOUNTER — Other Ambulatory Visit: Payer: Self-pay | Admitting: Cardiology

## 2022-09-15 NOTE — Telephone Encounter (Signed)
Rx(s) sent to pharmacy electronically.  

## 2022-09-17 ENCOUNTER — Other Ambulatory Visit: Payer: Self-pay | Admitting: Internal Medicine

## 2022-09-21 LAB — COLOGUARD

## 2022-09-28 DIAGNOSIS — M25572 Pain in left ankle and joints of left foot: Secondary | ICD-10-CM | POA: Diagnosis not present

## 2022-09-28 DIAGNOSIS — M6281 Muscle weakness (generalized): Secondary | ICD-10-CM | POA: Diagnosis not present

## 2022-09-28 DIAGNOSIS — Z1211 Encounter for screening for malignant neoplasm of colon: Secondary | ICD-10-CM | POA: Diagnosis not present

## 2022-09-28 DIAGNOSIS — M25571 Pain in right ankle and joints of right foot: Secondary | ICD-10-CM | POA: Diagnosis not present

## 2022-09-28 DIAGNOSIS — R2689 Other abnormalities of gait and mobility: Secondary | ICD-10-CM | POA: Diagnosis not present

## 2022-10-05 DIAGNOSIS — M25572 Pain in left ankle and joints of left foot: Secondary | ICD-10-CM | POA: Diagnosis not present

## 2022-10-05 DIAGNOSIS — R2689 Other abnormalities of gait and mobility: Secondary | ICD-10-CM | POA: Diagnosis not present

## 2022-10-05 DIAGNOSIS — M25571 Pain in right ankle and joints of right foot: Secondary | ICD-10-CM | POA: Diagnosis not present

## 2022-10-05 DIAGNOSIS — M6281 Muscle weakness (generalized): Secondary | ICD-10-CM | POA: Diagnosis not present

## 2022-10-05 LAB — COLOGUARD: COLOGUARD: NEGATIVE

## 2022-10-07 ENCOUNTER — Encounter: Payer: Self-pay | Admitting: Medical

## 2022-10-07 ENCOUNTER — Ambulatory Visit: Payer: BC Managed Care – PPO | Attending: Medical | Admitting: Medical

## 2022-10-07 ENCOUNTER — Ambulatory Visit: Payer: BC Managed Care – PPO | Admitting: Podiatry

## 2022-10-07 ENCOUNTER — Encounter: Payer: Self-pay | Admitting: Podiatry

## 2022-10-07 VITALS — BP 122/82 | HR 71 | Ht 63.0 in | Wt 166.0 lb

## 2022-10-07 VITALS — BP 132/68 | HR 72 | Ht 63.0 in | Wt 168.0 lb

## 2022-10-07 DIAGNOSIS — G4733 Obstructive sleep apnea (adult) (pediatric): Secondary | ICD-10-CM

## 2022-10-07 DIAGNOSIS — I700 Atherosclerosis of aorta: Secondary | ICD-10-CM

## 2022-10-07 DIAGNOSIS — M216X1 Other acquired deformities of right foot: Secondary | ICD-10-CM

## 2022-10-07 DIAGNOSIS — M216X2 Other acquired deformities of left foot: Secondary | ICD-10-CM

## 2022-10-07 DIAGNOSIS — E782 Mixed hyperlipidemia: Secondary | ICD-10-CM | POA: Diagnosis not present

## 2022-10-07 DIAGNOSIS — Z8249 Family history of ischemic heart disease and other diseases of the circulatory system: Secondary | ICD-10-CM | POA: Diagnosis not present

## 2022-10-07 MED ORDER — PITAVASTATIN CALCIUM 4 MG PO TABS: 4.0000 mg | ORAL_TABLET | Freq: Every day | ORAL | 3 refills | Status: DC

## 2022-10-07 MED ORDER — EZETIMIBE 10 MG PO TABS: 10.0000 mg | ORAL_TABLET | Freq: Every day | ORAL | 3 refills | Status: DC

## 2022-10-07 NOTE — Progress Notes (Signed)
Chief Complaint  Patient presents with   Foot Problem    Has been doing PT 1xw with home exercises    HPI: 62 y.o. female presenting today for orthotics dispensable.  No change.  Brief history: Patient has developed pain and tenderness over the past several years to the forefoot.  She says that she has tried arch supports with heel lifts but she continues to have pain and tenderness to the forefoot.  This has been an ongoing chronic problem for over 10 years despite shoe gear modifications.  Patient currently going to physical therapy which seems to help minimally  Past Medical History:  Diagnosis Date   Allergy    Anxiety    Asthma    Depression    Family history of premature CAD 06/24/2021   Coronary Calcium Score 05/26/2020: Calcium score 0.  Aortic atherosclerosis noted.   Fibromyalgia    Glaucoma    Hyperlipidemia    Neuropathy    folloewd pcp   Osteoporosis    Sleep apnea    On CPAP   Thyroid disease     Past Surgical History:  Procedure Laterality Date   INCONTINENCE SURGERY  10/27/2011   bladder sling   TRANSTHORACIC ECHOCARDIOGRAM  06/04/2020   EF 55 to 60%.  No RWMA.  GR 1 DD with mild LA dilation.  Normal mitral and aortic valves.  Normal RV size and function.  Normal RVP and RAP.  (Normal)   uterine ablation  04/28/2010    Allergies  Allergen Reactions   Aspartame Cough and Shortness Of Breath   Penicillins Other (See Comments)    Hives and tightness in chest   Erythromycin Nausea And Vomiting   Milk Protein Other (See Comments)   Other Other (See Comments)   Statins Other (See Comments)    Muscle aches in past on pravastatin, Crestor, Zocor, Lipitor - while in [place] Muscle aches in past on pravastatin, Crestor, Zocor, Lipitor - while in [place]     Physical Exam: General: The patient is alert and oriented x3 in no acute distress.  Dermatology: Skin is warm, dry and supple bilateral lower extremities.   Vascular: Palpable pedal pulses bilaterally.  Capillary refill within normal limits.  No appreciable edema.  No erythema.  Neurological: Grossly intact via light touch  Musculoskeletal Exam: Limited ankle joint dorsiflexion.  Positive Silfverskiold test.  Findings consistent with a tight posterior complex and gastrocnemius equinus.  There is associated tenderness with palpation throughout the forefoot  Radiographic Exam B/L:  Normal osseous mineralization. Joint spaces preserved.  No fractures or osseous irregularities noted.  Assessment/Plan of Care: 1.  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  Break-in instructions provided. 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  Ultimately the patient has been dealing with this for about 10 years and she may benefit from gastrocnemius aponeurosis lengthening/Baumann procedure if conservative treatment continues to fail    Felecia Shelling, DPM [facility]  Dr. Felecia Shelling, DPM    [address], [place] [address]  Office [phone]  Fax [phone]

## 2022-10-07 NOTE — Patient Instructions (Signed)

## 2022-10-07 NOTE — Progress Notes (Signed)
Cardiology Office Note:    Date:  10/07/2022   ID:  Christine Berger, DOB March 30, 1960, MRN [id number]  PCP:  Christine Cords, DO  [facility] Cardiologist:  Christine Lemma, MD  [facility] Electrophysiologist:  None   Referring MD: Christine Berger *   Chief Complaint: 1 year follow-up  History of Present Illness:    Christine Berger is a 62 y.o. female with a hx of hyperlipidemia, thoracic aortic atherosclerosis, allergies, family history of premature CAD, OSA on CPA who presents for 1 year follow-up.  Cardiac scoring in 2022 showed coronary calcium score of 0.  Aortic atherosclerosis noted on imaging study.  Echo at that time showed EF of 55 to 60%, grade 1 diastolic dysfunction, trivial MR.  The patient was last seen in March 2023 and was overall stable from a cardiac perspective.  Today, the patient is overall doing well. Cholesterol is much better. She denies chest pain, shortness of breath. She has occasional lower leg edema from 'toe walking', she is doing therapy for this. She denies orthopnea, pnd, lightheadedness or dizziness. She does no formal activity. She uses a CPAP machine.   Past Medical History:  Diagnosis Date   Allergy    Anxiety    Asthma    Depression    Family history of premature CAD 06/24/2021   Coronary Calcium Score 05/26/2020: Calcium score 0.  Aortic atherosclerosis noted.   Fibromyalgia    Glaucoma    Hyperlipidemia    Neuropathy    folloewd pcp   Osteoporosis    Sleep apnea    On CPAP   Thyroid disease     Past Surgical History:  Procedure Laterality Date   INCONTINENCE SURGERY  10/27/2011   bladder sling   TRANSTHORACIC ECHOCARDIOGRAM  06/04/2020   EF 55 to 60%.  No RWMA.  GR 1 DD with mild LA dilation.  Normal mitral and aortic valves.  Normal RV size and function.  Normal RVP and RAP.  (Normal)   uterine ablation  04/28/2010    Current Medications: Current Meds  Medication Sig   albuterol (VENTOLIN HFA) 108 (90  Base) MCG/ACT inhaler INHALE 2 PUFFS INTO THE LUNGS EVERY 4 HOURS AS NEEDED FOR WHEEZING, SHORTNESS OF BREATH, OR COUGH   buPROPion (WELLBUTRIN XL) 150 MG 24 hr tablet Take 1 tablet (150 mg total) by mouth daily.   busPIRone (BUSPAR) 10 MG tablet Take 1 tablet (10 mg total) by mouth 2 (two) times daily as needed.   conjugated estrogens (PREMARIN) vaginal cream Premarin 0.625 mg/gram vaginal cream  INSERT 0.5 G TWICE A WEEK BY VAGINAL ROUTE AS NEEDED FOR 30 DAYS.   desloratadine-pseudoephedrine (CLARINEX-D 12-HOUR) 2.5-120 MG 12 hr tablet Take 1 tablet by mouth 2 (two) times daily.   DULoxetine (CYMBALTA) 60 MG capsule Take 1 capsule (60 mg total) by mouth daily.   eletriptan (RELPAX) 40 MG tablet Take 40 mg by mouth as needed for migraine or headache (USE AS DIRECTED). May repeat in 2 hours if headache persists or recurs.   estradiol (ESTRACE) 2 MG tablet Take 2 mg by mouth daily.    fluticasone (FLONASE) 50 MCG/ACT nasal spray Place 2 sprays into both nostrils daily as needed for allergies.   ipratropium (ATROVENT) 0.06 % nasal spray Place 2 sprays into both nostrils 4 (four) times daily.   levothyroxine (SYNTHROID) 75 MCG tablet Take 1 tablet (75 mcg total) by mouth daily.   methocarbamol (ROBAXIN) 500 MG tablet Take 500 mg by mouth 3  times/day as needed-between meals & bedtime for muscle spasms.   metroNIDAZOLE (METROCREAM) 0.75 % cream Apply 1 application topically daily.   mirabegron ER (MYRBETRIQ) 50 MG TB24 tablet Take 1 tablet (50 mg total) by mouth daily.   montelukast (SINGULAIR) 10 MG tablet TAKE 1 TABLET BY MOUTH EVERYDAY AT BEDTIME   omeprazole (PRILOSEC) 40 MG capsule Take 1 capsule (40 mg total) by mouth daily before breakfast.   progesterone (PROMETRIUM) 100 MG capsule Take 100 mg by mouth daily.    [DISCONTINUED] ezetimibe (ZETIA) 10 MG tablet TAKE 1 TABLET BY MOUTH ONCE DAILY *NEED TO SCHEDULE ANNUAL APPOINTMENT FOR FUTURE REFILLS*   [DISCONTINUED] Pitavastatin Calcium 4 MG TABS  TAKE 1 TABLET BY MOUTH ONCE DAILY. NEED APPT FOR FURTHER REFILL     Allergies:   Aspartame, Penicillins, Erythromycin, Milk protein, Other, and Statins   Social History   Socioeconomic History   Marital status: Married    Spouse name: Not on file   Number of children: Not on file   Years of education: Not on file   Highest education level: Not on file  Occupational History   Not on file  Tobacco Use   Smoking status: Never   Smokeless tobacco: Never  Vaping Use   Vaping status: Never Used  Substance and Sexual Activity   Alcohol use: Yes    Alcohol/week: 1.0 - 2.0 standard drink of alcohol    Types: 1 - 2 Glasses of wine per week    Comment: monthly   Drug use: No   Sexual activity: Not on file  Other Topics Concern   Not on file  Social History Narrative   Not on file   Social Determinants of Health   Financial Resource Strain: Not on file  Food Insecurity: Not on file  Transportation Needs: Not on file  Physical Activity: Not on file  Stress: Not on file  Social Connections: Not on file     Family History: The patient's family history includes Anuerysm in her mother; Arthritis in her sister; Cancer in her mother; Crohn's disease in her sister; Depression in her mother; Diabetes in her mother; Heart attack in her father; Stroke in her mother. There is no history of Breast cancer.  ROS:   Please see the history of present illness.     All other systems reviewed and are negative.  EKGs/Labs/Other Studies Reviewed:    The following studies were reviewed today:  Echo 05/2020  1. Left ventricular ejection fraction, by estimation, is 55 to 60%. The  left ventricle has normal function. The left ventricle has no regional  wall motion abnormalities. Left ventricular diastolic parameters are  consistent with Grade I diastolic  dysfunction (impaired relaxation).   2. Right ventricular systolic function is normal. The right ventricular  size is normal.   3. Left  atrial size was mildly dilated.   4. The mitral valve is normal in structure. Trivial mitral valve  regurgitation.   5. The aortic valve is normal in structure. Aortic valve regurgitation is  not visualized.   6. The inferior vena cava is normal in size with greater than 50%  respiratory variability, suggesting right atrial pressure of 3 mmHg.    Cardiac Score 05/2020 FINDINGS: Non-cardiac: See separate report from [facility].   Ascending Aorta: Normal size   Pericardium: Normal   Coronary arteries: Calcium score 0   IMPRESSION: Coronary calcium score of 0.  EKG:  EKG is  ordered today.  The ekg ordered today  demonstrates NSR, 71bpm, TWI V2, nonspecific ST changes  Recent Labs: 07/06/2022: ALT 45; BUN 16; Creat 0.88; Hemoglobin 13.7; Platelets 269; Potassium 4.0; Sodium 140; TSH 4.91  Recent Lipid Panel    Component Value Date/Time   CHOL 189 07/06/2022 0823   CHOL 211 (H) 11/14/2018 0759   CHOL 174 02/27/2014 0818   TRIG 169 (H) 07/06/2022 0823   TRIG 87 02/27/2014 0818   HDL 75 07/06/2022 0823   HDL 62 11/14/2018 0759   HDL 71 02/27/2014 0818   CHOLHDL 2.5 07/06/2022 0823   VLDL 15.6 10/25/2013 0920   LDLCALC 87 07/06/2022 0823   LDLCALC 86 02/27/2014 0818   LDLDIRECT 141.0 05/06/2013 0745    Physical Exam:    VS:  BP 122/82 (BP Location: Left Arm, Patient Position: Sitting, Cuff Size: Large)   Pulse 66   Ht 5\' 3"  (1.6 m)   Wt 166 lb (75.3 kg)   SpO2 95%   BMI 29.41 kg/m     Wt Readings from Last 3 Encounters:  10/07/22 166 lb (75.3 kg)  07/13/22 168 lb (76.2 kg)  07/08/22 162 lb 14.7 oz (73.9 kg)     GEN:  Well nourished, well developed in no acute distress HEENT: Normal NECK: No JVD; No carotid bruits LYMPHATICS: No lymphadenopathy CARDIAC: RRR, no murmurs, rubs, gallops RESPIRATORY:  Clear to auscultation without rales, wheezing or rhonchi  ABDOMEN: Soft, non-tender, non-distended MUSCULOSKELETAL:  No edema; No deformity  SKIN: Warm and  dry NEUROLOGIC:  Alert and oriented x 3 PSYCHIATRIC:  Normal affect   ASSESSMENT:    1. Mixed hyperlipidemia   2. Atherosclerosis of aorta (HCC)   3. Family history of premature CAD   4. OSA on CPAP    PLAN:    In order of problems listed above:  Mixed HLD Most recent lipid panel showed LDL 87, triglycerides 169, HDL 75, total cholesterol 161.  LDL target goal less than 096.  Patient is taking Zetia and Pitavastatin, we will send in refills today.  Thoracic aortic atherosclerosis Noted on coronary calcium score test.  Family history of premature CAD Coronary calcium score 2022 was 0.  No further workup at this time.  Continue cholesterol therapy.  OSA Patient is compliant with CPAP.   Disposition: Follow up in 1 year(s) with MD/APP     Signed, Merl Bommarito David Stall, PA-C  10/07/2022 8:19 AM    [facility]

## 2022-10-07 NOTE — Patient Instructions (Signed)
Medication Instructions:  Your physician recommends that you continue on your current medications as directed. Please refer to the Current Medication list given to you today.  *If you need a refill on your cardiac medications before your next appointment, please call your pharmacy*  Lab Work: -None ordered  Testing/Procedures: -None ordered  Follow-Up: At [facility], you and your health needs are our priority.  As part of our continuing mission to provide you with exceptional heart care, we have created designated Provider Care Teams.  These Care Teams include your primary Cardiologist (physician) and Advanced Practice Providers (APPs -  Physician Assistants and Nurse Practitioners) who all work together to provide you with the care you need, when you need it.  Your next appointment:   1 year(s)  Provider:   You may see Bryan Lemma, MD or one of the following Advanced Practice Providers on your designated Care Team:   Nicolasa Ducking, NP Eula Listen, PA-C Cadence Fransico Michael, PA-C Charlsie Quest, NP    Other Instructions -None

## 2022-10-16 ENCOUNTER — Encounter: Payer: Self-pay | Admitting: Family Medicine

## 2022-10-17 MED ORDER — ELETRIPTAN HYDROBROMIDE 40 MG PO TABS: 40.0000 mg | ORAL_TABLET | ORAL | 0 refills | Status: DC | PRN

## 2022-10-18 ENCOUNTER — Telehealth: Payer: Self-pay

## 2022-10-18 ENCOUNTER — Other Ambulatory Visit (HOSPITAL_COMMUNITY): Payer: Self-pay

## 2022-10-18 NOTE — Telephone Encounter (Addendum)
Pharmacy Patient Advocate Encounter   Received notification from CoverMyMeds that prior authorization for PITAVASTATIN is required/requested.   Insurance verification completed.   The patient is insured through [employer] .   Per test claim: PA submitted to [facility] via fax

## 2022-10-18 NOTE — Telephone Encounter (Signed)
Error with PA request in Covermymeds "member not found".   Called plan, they are getting an error also trying to verify the patient in their system.   Plan is new and information is not populating in [place] system. They are faxing me a form to submit now.

## 2022-10-19 DIAGNOSIS — M25571 Pain in right ankle and joints of right foot: Secondary | ICD-10-CM | POA: Diagnosis not present

## 2022-10-19 DIAGNOSIS — M6281 Muscle weakness (generalized): Secondary | ICD-10-CM | POA: Diagnosis not present

## 2022-10-19 DIAGNOSIS — M25572 Pain in left ankle and joints of left foot: Secondary | ICD-10-CM | POA: Diagnosis not present

## 2022-10-19 DIAGNOSIS — R2689 Other abnormalities of gait and mobility: Secondary | ICD-10-CM | POA: Diagnosis not present

## 2022-10-31 ENCOUNTER — Other Ambulatory Visit (HOSPITAL_COMMUNITY): Payer: Self-pay

## 2022-10-31 NOTE — Telephone Encounter (Addendum)
Faxed form to [employer] with no response. [employer] is the pharmacy benefits plan for patient. Reaching them now to try and resolve.   Faxed plan information with a different form 10/31/22

## 2022-11-03 ENCOUNTER — Other Ambulatory Visit (HOSPITAL_COMMUNITY): Payer: Self-pay

## 2022-11-03 DIAGNOSIS — R2689 Other abnormalities of gait and mobility: Secondary | ICD-10-CM | POA: Diagnosis not present

## 2022-11-03 DIAGNOSIS — M6281 Muscle weakness (generalized): Secondary | ICD-10-CM | POA: Diagnosis not present

## 2022-11-03 DIAGNOSIS — M25571 Pain in right ankle and joints of right foot: Secondary | ICD-10-CM | POA: Diagnosis not present

## 2022-11-03 DIAGNOSIS — M25572 Pain in left ankle and joints of left foot: Secondary | ICD-10-CM | POA: Diagnosis not present

## 2022-11-03 NOTE — Telephone Encounter (Signed)
Pharmacy Patient Advocate Encounter  Received notification from [facility] that Prior Authorization for Pitavastatin has been APPROVED from 10/31/2022 to 10/31/2023. Ran test claim, Copay is $117.43. This test claim was processed through [facility]- copay amounts may vary at other pharmacies due to pharmacy/plan contracts, or as the patient moves through the different stages of their insurance plan.   PA #/Case ID/Reference #: [id number]

## 2022-11-16 ENCOUNTER — Other Ambulatory Visit: Payer: Self-pay | Admitting: Podiatry

## 2022-11-16 DIAGNOSIS — M216X1 Other acquired deformities of right foot: Secondary | ICD-10-CM

## 2022-11-16 DIAGNOSIS — G5763 Lesion of plantar nerve, bilateral lower limbs: Secondary | ICD-10-CM

## 2022-11-16 DIAGNOSIS — M79671 Pain in right foot: Secondary | ICD-10-CM

## 2022-12-13 ENCOUNTER — Encounter: Payer: Self-pay | Admitting: Podiatry

## 2022-12-13 ENCOUNTER — Ambulatory Visit: Payer: BC Managed Care – PPO | Admitting: Podiatry

## 2022-12-13 VITALS — BP 154/71 | HR 69

## 2022-12-13 DIAGNOSIS — M216X2 Other acquired deformities of left foot: Secondary | ICD-10-CM | POA: Diagnosis not present

## 2022-12-13 DIAGNOSIS — M216X1 Other acquired deformities of right foot: Secondary | ICD-10-CM

## 2022-12-13 NOTE — Progress Notes (Signed)
Chief Complaint  Patient presents with   Foot Pain    "They're doing okay."    HPI: 62 y.o. female presenting today for follow-up evaluation of bilateral forefoot pain and metatarsalgia secondary to tight posterior complex.  Patient states that the orthotics help significantly.  She finds them very comfortable and alleviates a lot of her symptoms and pain.  Brief history: Patient has developed pain and tenderness over the past several years to the forefoot.  She says that she has tried arch supports with heel lifts but she continues to have pain and tenderness to the forefoot.  This has been an ongoing chronic problem for over 10 years despite shoe gear modifications.  Patient has been to physical therapy which seems to help minimally  Past Medical History:  Diagnosis Date   Allergy    Anxiety    Asthma    Depression    Family history of premature CAD 06/24/2021   Coronary Calcium Score 05/26/2020: Calcium score 0.  Aortic atherosclerosis noted.   Fibromyalgia    Glaucoma    Hyperlipidemia    Neuropathy    folloewd pcp   Osteoporosis    Sleep apnea    On CPAP   Thyroid disease     Past Surgical History:  Procedure Laterality Date   INCONTINENCE SURGERY  10/27/2011   bladder sling   TRANSTHORACIC ECHOCARDIOGRAM  06/04/2020   EF 55 to 60%.  No RWMA.  GR 1 DD with mild LA dilation.  Normal mitral and aortic valves.  Normal RV size and function.  Normal RVP and RAP.  (Normal)   uterine ablation  04/28/2010    Allergies  Allergen Reactions   Aspartame Cough and Shortness Of Breath   Penicillins Other (See Comments)    Hives and tightness in chest   Erythromycin Nausea And Vomiting   Milk Protein Other (See Comments)   Other Other (See Comments)   Statins Other (See Comments)    Muscle aches in past on pravastatin, Crestor, Zocor, Lipitor - while in [place] Muscle aches in past on pravastatin, Crestor, Zocor, Lipitor - while in [place]     Physical Exam: General: The  patient is alert and oriented x3 in no acute distress.  Dermatology: Skin is warm, dry and supple bilateral lower extremities.   Vascular: Palpable pedal pulses bilaterally. Capillary refill within normal limits.  No appreciable edema.  No erythema.  Neurological: Grossly intact via light touch  Musculoskeletal Exam: Unchanged.  Limited ankle joint dorsiflexion.  Positive Silfverskiold test.  Findings consistent with a tight posterior complex and gastrocnemius equinus.  There is associated tenderness with palpation throughout the forefoot  Radiographic Exam B/L feet 08/09/2022:  Normal osseous mineralization. Joint spaces preserved.  No fractures or osseous irregularities noted.  Assessment/Plan of Care: 1.  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 Felecia Shelling, DPM    [address], [place] [address]  Office [phone]  Fax [phone]

## 2022-12-15 DIAGNOSIS — H40003 Preglaucoma, unspecified, bilateral: Secondary | ICD-10-CM | POA: Diagnosis not present

## 2022-12-15 DIAGNOSIS — Z0100 Encounter for examination of eyes and vision without abnormal findings: Secondary | ICD-10-CM | POA: Diagnosis not present

## 2022-12-23 DIAGNOSIS — Z23 Encounter for immunization: Secondary | ICD-10-CM | POA: Diagnosis not present

## 2022-12-26 DIAGNOSIS — Z1231 Encounter for screening mammogram for malignant neoplasm of breast: Secondary | ICD-10-CM | POA: Diagnosis not present

## 2022-12-26 DIAGNOSIS — Z01419 Encounter for gynecological examination (general) (routine) without abnormal findings: Secondary | ICD-10-CM | POA: Diagnosis not present

## 2022-12-26 DIAGNOSIS — Z6829 Body mass index (BMI) 29.0-29.9, adult: Secondary | ICD-10-CM | POA: Diagnosis not present

## 2022-12-27 ENCOUNTER — Other Ambulatory Visit: Payer: Self-pay | Admitting: Family Medicine

## 2022-12-27 DIAGNOSIS — J4520 Mild intermittent asthma, uncomplicated: Secondary | ICD-10-CM

## 2022-12-27 DIAGNOSIS — J302 Other seasonal allergic rhinitis: Secondary | ICD-10-CM

## 2022-12-28 ENCOUNTER — Other Ambulatory Visit: Payer: Self-pay | Admitting: Obstetrics and Gynecology

## 2022-12-28 DIAGNOSIS — R928 Other abnormal and inconclusive findings on diagnostic imaging of breast: Secondary | ICD-10-CM

## 2023-01-06 ENCOUNTER — Other Ambulatory Visit: Payer: Managed Care, Other (non HMO)

## 2023-01-06 DIAGNOSIS — R7303 Prediabetes: Secondary | ICD-10-CM | POA: Diagnosis not present

## 2023-01-06 DIAGNOSIS — R7989 Other specified abnormal findings of blood chemistry: Secondary | ICD-10-CM | POA: Diagnosis not present

## 2023-01-07 LAB — COMPLETE METABOLIC PANEL WITH GFR
AG Ratio: 1.8 (calc) (ref 1.0–2.5)
ALT: 24 U/L (ref 6–29)
AST: 20 U/L (ref 10–35)
Albumin: 4.2 g/dL (ref 3.6–5.1)
Alkaline phosphatase (APISO): 57 U/L (ref 37–153)
BUN: 14 mg/dL (ref 7–25)
CO2: 28 mmol/L (ref 20–32)
Calcium: 9.3 mg/dL (ref 8.6–10.4)
Chloride: 102 mmol/L (ref 98–110)
Creat: 0.72 mg/dL (ref 0.50–1.05)
Globulin: 2.3 g/dL (ref 1.9–3.7)
Glucose, Bld: 108 mg/dL — ABNORMAL HIGH (ref 65–99)
Potassium: 4.5 mmol/L (ref 3.5–5.3)
Sodium: 141 mmol/L (ref 135–146)
Total Bilirubin: 0.6 mg/dL (ref 0.2–1.2)
Total Protein: 6.5 g/dL (ref 6.1–8.1)
eGFR: 95 mL/min/{1.73_m2} (ref >=60)

## 2023-01-07 LAB — HEMOGLOBIN A1C
Hgb A1c MFr Bld: 6.1 %{Hb} — ABNORMAL HIGH (ref <5.7)
Mean Plasma Glucose: 128 mg/dL
eAG (mmol/L): 7.1 mmol/L

## 2023-01-09 ENCOUNTER — Ambulatory Visit
Admission: RE | Admit: 2023-01-09 | Discharge: 2023-01-09 | Disposition: A | Discharge status: Home / Self Care | Payer: BC Managed Care – PPO | Source: Ambulatory Visit | Attending: Obstetrics and Gynecology | Admitting: Obstetrics and Gynecology

## 2023-01-09 DIAGNOSIS — R928 Other abnormal and inconclusive findings on diagnostic imaging of breast: Secondary | ICD-10-CM

## 2023-01-13 ENCOUNTER — Other Ambulatory Visit: Payer: Self-pay | Admitting: Family Medicine

## 2023-01-13 ENCOUNTER — Ambulatory Visit: Payer: Self-pay | Admitting: Family Medicine

## 2023-01-13 ENCOUNTER — Encounter: Payer: Self-pay | Admitting: Family Medicine

## 2023-01-13 VITALS — BP 136/64 | HR 75 | Ht 62.0 in | Wt 167.6 lb

## 2023-01-13 DIAGNOSIS — J302 Other seasonal allergic rhinitis: Secondary | ICD-10-CM

## 2023-01-13 DIAGNOSIS — E039 Hypothyroidism, unspecified: Secondary | ICD-10-CM

## 2023-01-13 DIAGNOSIS — M797 Fibromyalgia: Secondary | ICD-10-CM

## 2023-01-13 DIAGNOSIS — K219 Gastro-esophageal reflux disease without esophagitis: Secondary | ICD-10-CM

## 2023-01-13 DIAGNOSIS — F3341 Major depressive disorder, recurrent, in partial remission: Secondary | ICD-10-CM

## 2023-01-13 DIAGNOSIS — R7989 Other specified abnormal findings of blood chemistry: Secondary | ICD-10-CM

## 2023-01-13 DIAGNOSIS — G4733 Obstructive sleep apnea (adult) (pediatric): Secondary | ICD-10-CM

## 2023-01-13 DIAGNOSIS — I700 Atherosclerosis of aorta: Secondary | ICD-10-CM

## 2023-01-13 DIAGNOSIS — E782 Mixed hyperlipidemia: Secondary | ICD-10-CM

## 2023-01-13 DIAGNOSIS — E66811 Obesity, class 1: Secondary | ICD-10-CM

## 2023-01-13 DIAGNOSIS — F419 Anxiety disorder, unspecified: Secondary | ICD-10-CM

## 2023-01-13 DIAGNOSIS — R7303 Prediabetes: Secondary | ICD-10-CM

## 2023-01-13 DIAGNOSIS — J4520 Mild intermittent asthma, uncomplicated: Secondary | ICD-10-CM

## 2023-01-13 DIAGNOSIS — Z0000 Encounter for general adult medical examination without abnormal findings: Secondary | ICD-10-CM

## 2023-01-13 MED ORDER — DULOXETINE HCL 60 MG PO CPEP
60.0000 mg | ORAL_CAPSULE | Freq: Every day | ORAL | 3 refills | Status: DC
Start: 2023-01-13 — End: 2024-02-13

## 2023-01-13 MED ORDER — MONTELUKAST SODIUM 10 MG PO TABS
10.0000 mg | ORAL_TABLET | Freq: Every day | ORAL | 3 refills | Status: DC
Start: 2023-01-13 — End: 2023-06-16

## 2023-01-13 MED ORDER — OMEPRAZOLE 40 MG PO CPDR
40.0000 mg | DELAYED_RELEASE_CAPSULE | Freq: Every day | ORAL | 3 refills | Status: DC
Start: 2023-01-13 — End: 2023-06-16

## 2023-01-13 MED ORDER — BUSPIRONE HCL 10 MG PO TABS: 10.0000 mg | ORAL_TABLET | Freq: Two times a day (BID) | ORAL | 3 refills | Status: AC | PRN

## 2023-01-13 MED ORDER — WEGOVY 0.5 MG/0.5ML ~~LOC~~ SOAJ
0.5000 mg | SUBCUTANEOUS | 0 refills | Status: DC
Start: 2023-01-13 — End: 2023-02-27

## 2023-01-13 NOTE — Patient Instructions (Addendum)
Thank you for coming to the office today.  Attempting to order [facility] 0.5mg  weekly, if it is successful we can adjust the dose every 4 weeks if needed.  Other meds refilled  Recent Labs    04/06/22 1724 07/06/22 0823 01/06/23 0826  HGBA1C 6.4* 6.3* 6.1*   Weight is stable.  Liver enzymes are back to normal.  DUE for FASTING BLOOD WORK (no food or drink after midnight before the lab appointment, only water or coffee without cream/sugar on the morning of)  SCHEDULE "Lab Only" visit in the morning at the clinic for lab draw in 6 MONTHS   - Make sure Lab Only appointment is at about 1 week before your next appointment, so that results will be available  For Lab Results, once available within 2-3 days of blood draw, you can can log in to MyChart online to view your results and a brief explanation. Also, we can discuss results at next follow-up visit.   Please schedule a Follow-up Appointment to: Return in about 6 months (around 07/14/2023) for 6 month fasting lab only then 1 week later Annual Physical (No thursdays, prefer PM only for visit).  If you have any other questions or concerns, please feel free to call the office or send a message through MyChart. You may also schedule an earlier appointment if necessary.  Additionally, you may be receiving a survey about your experience at our office within a few days to 1 week by e-mail or mail. We value your feedback.  Saralyn Pilar, DO [facility], [place]

## 2023-01-13 NOTE — Progress Notes (Signed)
Subjective:    Patient ID: Christine Berger, female    DOB: [birth year], 62 y.o.   MRN: [id number]  Christine Berger is a 62 y.o. female presenting on 01/13/2023 for Prediabetes   HPI  Discussed the use of AI scribe software for clinical note transcription with the patient, who gave verbal consent to proceed.        The patient also has a history of atherosclerosis, which she believes may help in obtaining insurance coverage for the weight loss medication Wegovy. She expressed concern about potential weight regain after discontinuing weight loss medication, indicating a desire for sustainable lifestyle changes alongside medication use.  In terms of other medications, the patient is on Cymbalta, thyroid medication, buspirone, bupropion, and Singulair, all of which were reviewed for refills. The patient also takes a stomach acid medication intermittently for episodic symptoms.       Pre Diabetes Obesity BMI >30 ASCVD Aorta Atherosclerosis  Concern weight gain. Difficulty losing weight despite diet and exercise regimen. 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 She was able to lose 15+ lbs on medications then cost was high or med unavailable and delayed. Came off med. Now interested again to resume.  Atherosclerosis identified on previous imaging CT 05/26/20  A1c improved to 6.1 prior 6.3-6.4 range  LFTs - prior elevation Now normalized    History of GERD / Silent Reflux On PPI intermittently  Overactive Bladder Bladder Spasms No longer w/ Urology Unable to get Myrbetriq covered previously   HYPERLIPIDEMIA: Followed by Cardiologist - Reports no concerns. Last lipid panel 2024 controlled - Currently taking Zetia and Livalo, tolerating well without side effects or myalgias    Hypothyroidism Last lab TSH slight elevated 4.9 but normal Free T4 1.0 On Levothyroxine daily   Major Recurrent Depression, in partial remission Anxiety Doing well on current medication   Vitamin B12 Deficiency Vitamin D Deficiency Lab results with Vitamin D and B12 are normal range.     OSA on CPAP - Patient reports prior history of dx OSA and on CPAP for years, prior to treatment initial symptoms were snoring, daytime sleepiness and fatigue, has had several sleep studies in the past. - Today reports that sleep apnea is well controlled. She uses the CPAP machine every night. Tolerates the machine well, and thinks that sleeps better with it and feels good. No new concerns or symptoms.         01/13/2023    1:08 PM 07/13/2022    1:49 PM 07/12/2021    8:24 AM  Depression screen PHQ 2/9  Decreased Interest 1 0 1  Down, Depressed, Hopeless 0 0 0  PHQ - 2 Score 1 0 1  Altered sleeping 0 0 1  Tired, decreased energy 1 0 3  Change in appetite 1 0 1  Feeling bad or failure about yourself  0 0 1  Trouble concentrating 1 0 0  Moving slowly or fidgety/restless 0 0 0  Suicidal thoughts 0 0 0  PHQ-9 Score 4 0 7  Difficult doing work/chores Not difficult at all  Somewhat difficult    Social History   Tobacco Use   Smoking status: Never   Smokeless tobacco: Never  Vaping Use   Vaping status: Never Used  Substance Use Topics   Alcohol use: Not Currently  Alcohol/week: 1.0 - 2.0 standard drink of alcohol    Types: 1 - 2 Glasses of wine per week    Comment: monthly   Drug use: No    Review of Systems Per HPI unless specifically indicated above     Objective:    BP 136/64   Pulse 75   Ht 5\' 2"  (1.575 m)   Wt 167 lb 9.6 oz (76 kg)   SpO2 95%   BMI 30.65 kg/m   Wt Readings from Last 3 Encounters:  01/13/23 167 lb 9.6 oz (76 kg)  10/07/22 168 lb (76.2 kg)  10/07/22 166 lb (75.3 kg)    Physical Exam Vitals and nursing note reviewed.  Constitutional:       General: She is not in acute distress.    Appearance: Normal appearance. She is well-developed. She is not diaphoretic.     Comments: Well-appearing, comfortable, cooperative  HENT:     Head: Normocephalic and atraumatic.  Eyes:     General:        Right eye: No discharge.        Left eye: No discharge.     Conjunctiva/sclera: Conjunctivae normal.  Cardiovascular:     Rate and Rhythm: Normal rate.  Pulmonary:     Effort: Pulmonary effort is normal.  Skin:    General: Skin is warm and dry.     Findings: No erythema or rash.  Neurological:     Mental Status: She is alert and oriented to person, place, and time.  Psychiatric:        Mood and Affect: Mood normal.        Behavior: Behavior normal.        Thought Content: Thought content normal.     Comments: Well groomed, good eye contact, normal speech and thoughts    ADDENDUM REPORT: 05/26/2020 23:22   CLINICAL DATA:  Risk stratification   EXAM: Coronary Calcium Score   TECHNIQUE: The patient was scanned on a [employer]. Axial non-contrast 3 mm slices were carried out through the heart. The data set was analyzed on a dedicated work station and scored using the Agatson method.   FINDINGS: Non-cardiac: See separate report from [facility].   Ascending Aorta: Normal size   Pericardium: Normal   Coronary arteries: Calcium score 0   IMPRESSION: Coronary calcium score of 0.     Electronically Signed   By: Epifanio Lesches MD   On: 05/26/2020 23:22    Addended by Little Ishikawa, MD on 05/26/2020 11:25 PM    Study Result  Narrative & Impression  EXAM: OVER-READ INTERPRETATION  CT CHEST   The following report is an over-read performed by radiologist Dr. Jeronimo Greaves of [facility] on 05/26/2020. This over-read does not include interpretation of cardiac or coronary anatomy or pathology. The calcium score interpretation by the cardiologist is attached.   COMPARISON:   None.   FINDINGS: Vascular: Aortic atherosclerosis.   Mediastinum/Nodes: No imaged thoracic adenopathy.   Lungs/Pleura: No pleural fluid. 2 mm right lower lobe pulmonary nodule on 21/3.   Upper Abdomen: Normal imaged portions of the liver, spleen, stomach.   Musculoskeletal: No acute osseous abnormality.   IMPRESSION: 1.  No acute findings in the imaged extracardiac chest. 2.  Aortic Atherosclerosis (ICD10-I70.0). 3. 2 mm right lower lobe pulmonary nodule. No follow-up needed if patient is low-risk. Non-contrast chest CT can be considered in 12 months if patient is high-risk. This recommendation follows the consensus statement: Guidelines  for Management of Incidental Pulmonary Nodules Detected on CT Images: From the Fleischner Society 2017; Radiology 2017; [phone].   Electronically Signed: By: Jeronimo Greaves M.D. On: 05/26/2020 14:32     Results for orders placed or performed in visit on 01/06/23  Hemoglobin A1c  Result Value Ref Range   Hgb A1c MFr Bld 6.1 (H) <5.7 % of total Hgb   Mean Plasma Glucose 128 mg/dL   eAG (mmol/L) 7.1 mmol/L  COMPLETE METABOLIC PANEL WITH GFR  Result Value Ref Range   Glucose, Bld 108 (H) 65 - 99 mg/dL   BUN 14 7 - 25 mg/dL   Creat 4.09 8.11 - 9.14 mg/dL   eGFR 95 > OR = 60 NW/GNF/6.21H0   BUN/Creatinine Ratio SEE NOTE: 6 - 22 (calc)   Sodium 141 135 - 146 mmol/L   Potassium 4.5 3.5 - 5.3 mmol/L   Chloride 102 98 - 110 mmol/L   CO2 28 20 - 32 mmol/L   Calcium 9.3 8.6 - 10.4 mg/dL   Total Protein 6.5 6.1 - 8.1 g/dL   Albumin 4.2 3.6 - 5.1 g/dL   Globulin 2.3 1.9 - 3.7 g/dL (calc)   AG Ratio 1.8 1.0 - 2.5 (calc)   Total Bilirubin 0.6 0.2 - 1.2 mg/dL   Alkaline phosphatase (APISO) 57 37 - 153 U/L   AST 20 10 - 35 U/L   ALT 24 6 - 29 U/L      Assessment & Plan:   Problem List Items Addressed This Visit     OSA on CPAP (Chronic)   Seasonal allergies (Chronic)   Relevant Medications   montelukast (SINGULAIR) 10 MG tablet    Acquired hypothyroidism   Anxiety   Relevant Medications   DULoxetine (CYMBALTA) 60 MG capsule   busPIRone (BUSPAR) 10 MG tablet   Extrinsic asthma   Relevant Medications   montelukast (SINGULAIR) 10 MG tablet   Fibromyalgia   Relevant Medications   DULoxetine (CYMBALTA) 60 MG capsule   Laryngopharyngeal reflux (LPR)   Relevant Medications   omeprazole (PRILOSEC) 40 MG capsule   Major depressive disorder, recurrent episode, in partial remission (HCC)   Relevant Medications   DULoxetine (CYMBALTA) 60 MG capsule   busPIRone (BUSPAR) 10 MG tablet   Obesity (BMI 30.0-34.9)   Relevant Medications   WEGOVY 0.5 MG/0.5ML SOAJ   Pre-diabetes - Primary   Other Visit Diagnoses     Elevated LFTs       Aortic atherosclerosis (HCC)       Relevant Medications   WEGOVY 0.5 MG/0.5ML SOAJ      Assessment and Plan    Prediabetes A1c improved from 6.4 to 6.1 over the past 9 months. Goal is to reduce A1c to below 6.0. -Continue current management strategies.  Weight Management Obesity BMI >31 Aortic Atherosclerosis Failed >3 month structure lifestyle management with nutrition program health coach and exercise regimen Goal to pursue weight management meds again Prior trial on Zepbound / ozempic however did only sample and purchased from compounding pharmacy  -Attempt to obtain insurance approval for [employer] 0.5mg  weekly. -Consider health coach or nutritionist for additional support again  Medication Refills Cymbalta, thyroid medication, buspirone, Singulair, and stomach acid medication refills needed. -Refill prescriptions at [facility]  Follow-up Plan for yearly check in April. If Reginal Lutes is approved, may need to see patient in 3 months for medication adjustment and weight check.         Meds ordered this encounter  Medications   WEGOVY 0.5 MG/0.5ML  SOAJ    Sig: Inject 0.5 mg into the skin once a week.    Dispense:  2 mL    Refill:  0   DULoxetine (CYMBALTA) 60 MG  capsule    Sig: Take 1 capsule (60 mg total) by mouth daily.    Dispense:  90 capsule    Refill:  3   busPIRone (BUSPAR) 10 MG tablet    Sig: Take 1 tablet (10 mg total) by mouth 2 (two) times daily as needed.    Dispense:  180 tablet    Refill:  3   montelukast (SINGULAIR) 10 MG tablet    Sig: Take 1 tablet (10 mg total) by mouth at bedtime.    Dispense:  90 tablet    Refill:  3    Add future refills   omeprazole (PRILOSEC) 40 MG capsule    Sig: Take 1 capsule (40 mg total) by mouth daily before breakfast.    Dispense:  90 capsule    Refill:  3    Add refills      Follow up plan: Return in about 6 months (around 07/14/2023) for 6 month fasting lab only then 1 week later Annual Physical (No thursdays, prefer PM only for visit).  Future labs ordered 06/2023  Saralyn Pilar, DO [facility] 01/13/2023, 1:23 PM

## 2023-01-16 ENCOUNTER — Telehealth: Payer: Self-pay

## 2023-01-16 NOTE — Telephone Encounter (Signed)
Prior authorization for WEGOVY 0.5 MG/0.5 ML started in Cover My Meds.

## 2023-01-18 NOTE — Telephone Encounter (Signed)
Notification received that [facility] is APPROVED by patient's insurance.  Reference number: [id number] Effective dates: 01/16/2023 - 05/22/2023  Patient notified and she will check with her pharmacy to determine when they can have it ready for pickup. She verbalized understanding and excitement to get this covered.

## 2023-01-24 IMAGING — CR DG CHEST 2V
2 series · 2 of 2 positions shown · non-contrast
Comparison: Tuesday May, 2020.

CLINICAL DATA: Recent COVID diagnosis.

EXAM:
CHEST - 2 VIEW

[chest pa]
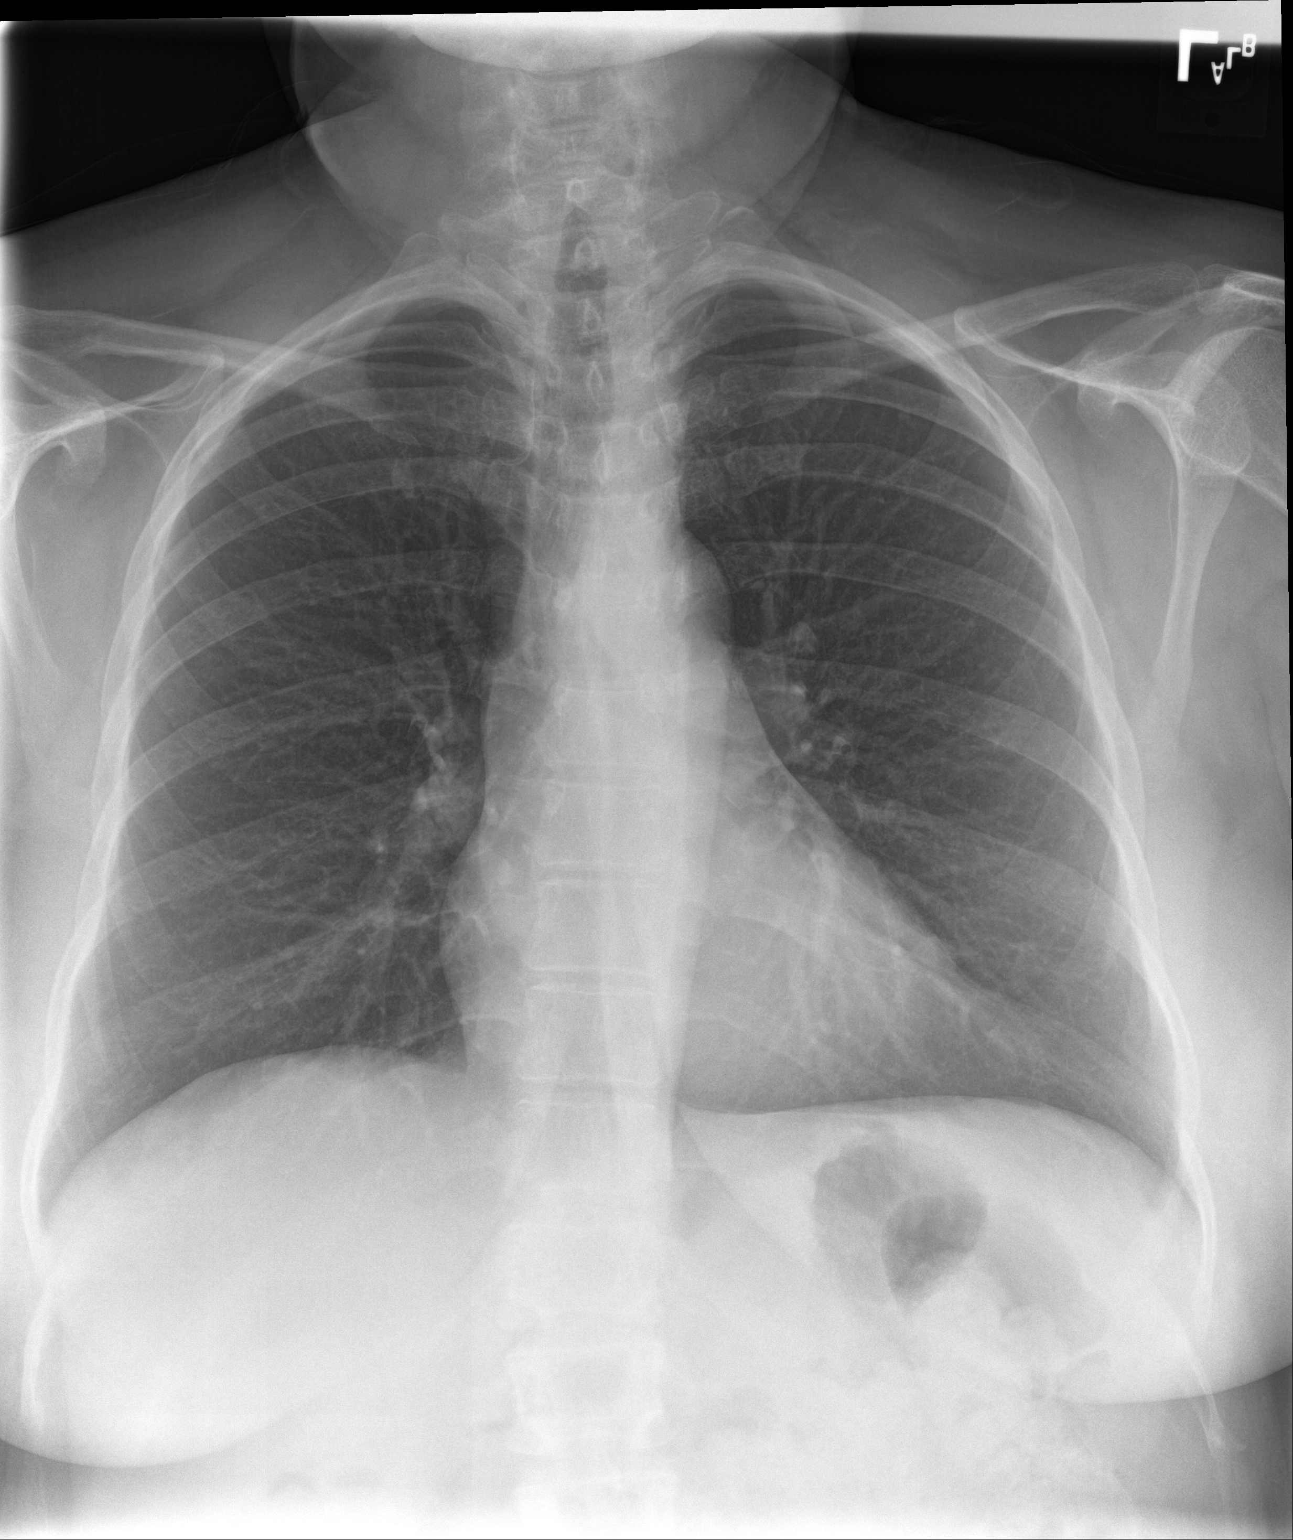

[chest lat]
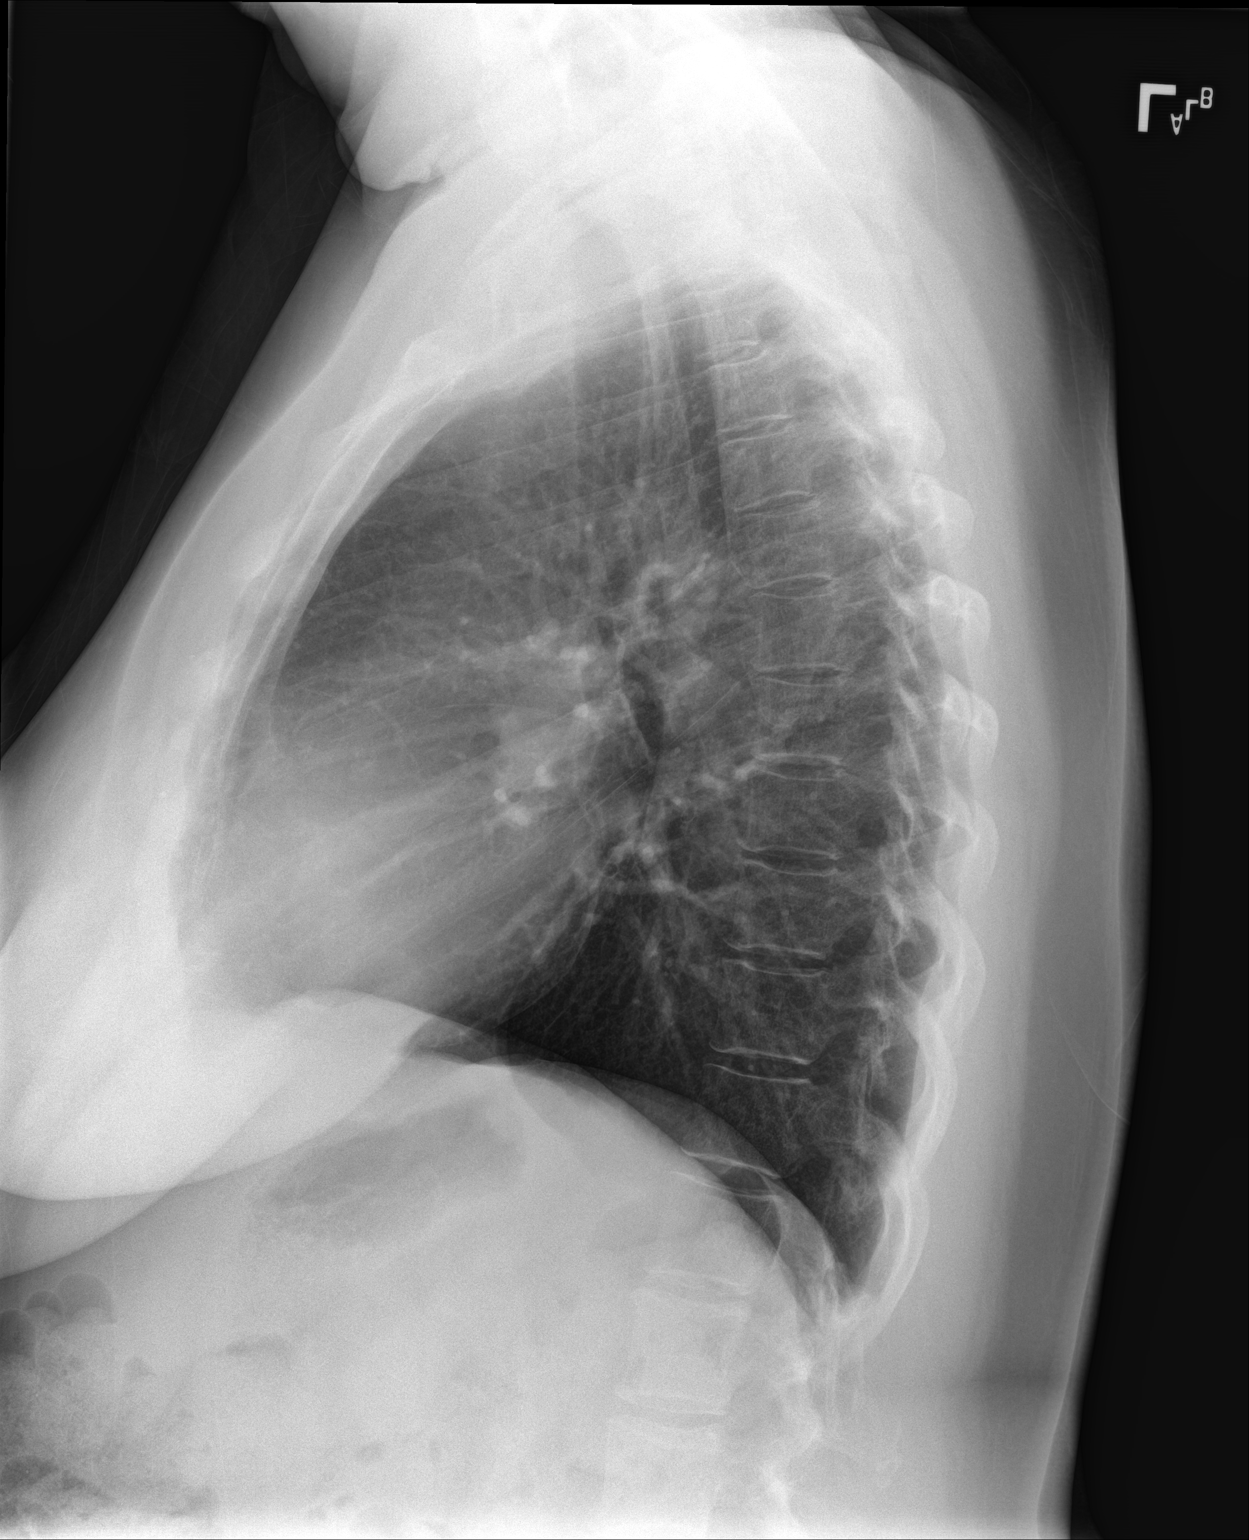

[2 of 2 positions shown; findings below may reference images not displayed]

FINDINGS: Cardiomediastinal contours and hilar structures are normal. Lungs
are clear. No sign of pleural effusion.

On limited assessment there is no acute skeletal process.
IMPRESSION: Normal chest.

## 2023-01-27 ENCOUNTER — Other Ambulatory Visit: Payer: Self-pay

## 2023-01-27 MED ORDER — ELETRIPTAN HYDROBROMIDE 40 MG PO TABS: 40.0000 mg | ORAL_TABLET | ORAL | 1 refills | Status: DC | PRN

## 2023-02-07 ENCOUNTER — Telehealth: Payer: Self-pay

## 2023-02-07 NOTE — Telephone Encounter (Signed)
Received refill request from pharmacy for Eletriptan 40 mg tabs. Sent pt MyChart message.

## 2023-02-13 ENCOUNTER — Encounter: Payer: Self-pay | Admitting: Family Medicine

## 2023-02-13 DIAGNOSIS — R110 Nausea: Secondary | ICD-10-CM

## 2023-02-14 MED ORDER — ONDANSETRON 4 MG PO TBDP
4.0000 mg | ORAL_TABLET | Freq: Three times a day (TID) | ORAL | 2 refills | Status: AC | PRN
Start: 2023-02-14 — End: ?

## 2023-02-14 NOTE — Addendum Note (Signed)
Addended by: Judd Gaudier on: 02/14/2023 09:06 AM   Modules accepted: Orders

## 2023-02-14 NOTE — Addendum Note (Signed)
Addended by: Smitty Cords on: 02/14/2023 05:59 PM   Modules accepted: Orders

## 2023-02-24 ENCOUNTER — Encounter: Payer: Self-pay | Admitting: Family Medicine

## 2023-02-24 DIAGNOSIS — I700 Atherosclerosis of aorta: Secondary | ICD-10-CM

## 2023-02-24 DIAGNOSIS — E66811 Obesity, class 1: Secondary | ICD-10-CM

## 2023-02-27 MED ORDER — WEGOVY 0.5 MG/0.5ML ~~LOC~~ SOAJ
0.5000 mg | SUBCUTANEOUS | 5 refills | Status: DC
Start: 2023-02-27 — End: 2023-04-24

## 2023-03-14 DIAGNOSIS — L859 Epidermal thickening, unspecified: Secondary | ICD-10-CM | POA: Diagnosis not present

## 2023-03-14 DIAGNOSIS — L821 Other seborrheic keratosis: Secondary | ICD-10-CM | POA: Diagnosis not present

## 2023-03-14 DIAGNOSIS — D1801 Hemangioma of skin and subcutaneous tissue: Secondary | ICD-10-CM | POA: Diagnosis not present

## 2023-03-14 DIAGNOSIS — L718 Other rosacea: Secondary | ICD-10-CM | POA: Diagnosis not present

## 2023-04-20 ENCOUNTER — Encounter: Payer: Self-pay | Admitting: Family Medicine

## 2023-04-24 ENCOUNTER — Encounter: Payer: Self-pay | Admitting: Family Medicine

## 2023-04-24 ENCOUNTER — Ambulatory Visit: Payer: Self-pay | Admitting: Family Medicine

## 2023-04-24 VITALS — BP 122/78 | HR 65 | Ht 62.0 in | Wt 153.0 lb

## 2023-04-24 DIAGNOSIS — E782 Mixed hyperlipidemia: Secondary | ICD-10-CM | POA: Diagnosis not present

## 2023-04-24 DIAGNOSIS — E663 Overweight: Secondary | ICD-10-CM

## 2023-04-24 DIAGNOSIS — G4733 Obstructive sleep apnea (adult) (pediatric): Secondary | ICD-10-CM

## 2023-04-24 DIAGNOSIS — R7303 Prediabetes: Secondary | ICD-10-CM

## 2023-04-24 MED ORDER — WEGOVY 1 MG/0.5ML ~~LOC~~ SOAJ
1.0000 mg | SUBCUTANEOUS | 2 refills | Status: DC
Start: 2023-04-24 — End: 2023-07-19

## 2023-04-24 NOTE — Progress Notes (Unsigned)
Subjective:    Patient ID: Christine Berger, female    DOB: 1960/06/03, 63 y.o.   MRN: [id number]  Christine Berger is a 63 y.o. female presenting on 04/24/2023 for Obesity   HPI  Discussed the use of AI scribe software for clinical note transcription with the patient, who gave verbal consent to proceed.  History of Present Illness    Overweight BMI >27 Mixed Hyperlipidemia Pre-Diabetes OSA on CPAP  Follow-up today on successful treatment with [facility] for weight loss. She has previously exhausted efforts of structured diet and exercise regimen >6 months without success. After last visit she was initiated on GLP therapy with [facility] for weight loss.  She has been on Wegovy 0.5 mg weekly for weight loss for the past four months, presented for a follow-up consultation. In 16 weeks, she has reported a significant weight loss of 14 pounds, which is more than the 5% body weight loss goal.  - Starting weight 167 lbs down to 153 lbs  She is tolerating the medication well now, but had some side effect GI disturbance initially. Now she is interested in dosage increase.  She uses CPAP machine for her OSA. Improved with weight loss She has been managing her blood sugar and cholesterol with improved weight loss. Upcoming labs are due.       04/24/2023    3:20 PM 01/13/2023    1:08 PM 07/13/2022    1:49 PM  Depression screen PHQ 2/9  Decreased Interest 1 1 0  Down, Depressed, Hopeless 0 0 0  PHQ - 2 Score 1 1 0  Altered sleeping 1 0 0  Tired, decreased energy 2 1 0  Change in appetite 0 1 0  Feeling bad or failure about yourself  0 0 0  Trouble concentrating 0 1 0  Moving slowly or fidgety/restless 0 0 0  Suicidal thoughts 0 0 0  PHQ-9 Score 4 4 0  Difficult doing work/chores Somewhat difficult Not difficult at all        04/24/2023    3:20 PM 01/13/2023    1:09 PM 07/13/2022    1:49 PM 07/12/2021    8:24 AM  GAD 7 : Generalized Anxiety Score  Nervous, Anxious, on Edge 0 0 2 1   Control/stop worrying 0 0 0 1  Worry too much - different things 0 0 0 0  Trouble relaxing 0 0 2 0  Restless 0 0 2 1  Easily annoyed or irritable 0 0 1 1  Afraid - awful might happen 0 0 0 0  Total GAD 7 Score 0 0 7 4  Anxiety Difficulty    Somewhat difficult    Social History   Tobacco Use   Smoking status: Never   Smokeless tobacco: Never  Vaping Use   Vaping status: Never Used  Substance Use Topics   Alcohol use: Not Currently    Alcohol/week: 1.0 - 2.0 standard drink of alcohol    Types: 1 - 2 Glasses of wine per week    Comment: monthly   Drug use: No    Review of Systems Per HPI unless specifically indicated above     Objective:    BP 122/78   Pulse 65   Ht 5\' 2"  (1.575 m)   Wt 153 lb (69.4 kg)   SpO2 96%   BMI 27.98 kg/m   Wt Readings from Last 3 Encounters:  04/24/23 153 lb (69.4 kg)  01/13/23 167 lb 9.6 oz (76 kg)  10/07/22  168 lb (76.2 kg)    Physical Exam Vitals and nursing note reviewed.  Constitutional:      General: She is not in acute distress.    Appearance: She is well-developed. She is not diaphoretic.     Comments: Well-appearing, comfortable, cooperative  HENT:     Head: Normocephalic and atraumatic.  Eyes:     General:        Right eye: No discharge.        Left eye: No discharge.     Conjunctiva/sclera: Conjunctivae normal.  Neck:     Thyroid: No thyromegaly.  Cardiovascular:     Rate and Rhythm: Normal rate and regular rhythm.     Heart sounds: Normal heart sounds. No murmur heard. Pulmonary:     Effort: Pulmonary effort is normal. No respiratory distress.     Breath sounds: Normal breath sounds. No wheezing or rales.  Musculoskeletal:        General: Normal range of motion.     Cervical back: Normal range of motion and neck supple.  Lymphadenopathy:     Cervical: No cervical adenopathy.  Skin:    General: Skin is warm and dry.     Findings: No erythema or rash.  Neurological:     Mental Status: She is alert and  oriented to person, place, and time.  Psychiatric:        Behavior: Behavior normal.     Comments: Well groomed, good eye contact, normal speech and thoughts     Results for orders placed or performed in visit on 01/06/23  Hemoglobin A1c   Collection Time: 01/06/23  8:26 AM  Result Value Ref Range   Hgb A1c MFr Bld 6.1 (H) <5.7 % of total Hgb   Mean Plasma Glucose 128 mg/dL   eAG (mmol/L) 7.1 mmol/L  COMPLETE METABOLIC PANEL WITH GFR   Collection Time: 01/06/23  8:26 AM  Result Value Ref Range   Glucose, Bld 108 (H) 65 - 99 mg/dL   BUN 14 7 - 25 mg/dL   Creat 1.61 0.96 - 0.45 mg/dL   eGFR 95 > OR = 60 WU/JWJ/1.91Y7   BUN/Creatinine Ratio SEE NOTE: 6 - 22 (calc)   Sodium 141 135 - 146 mmol/L   Potassium 4.5 3.5 - 5.3 mmol/L   Chloride 102 98 - 110 mmol/L   CO2 28 20 - 32 mmol/L   Calcium 9.3 8.6 - 10.4 mg/dL   Total Protein 6.5 6.1 - 8.1 g/dL   Albumin 4.2 3.6 - 5.1 g/dL   Globulin 2.3 1.9 - 3.7 g/dL (calc)   AG Ratio 1.8 1.0 - 2.5 (calc)   Total Bilirubin 0.6 0.2 - 1.2 mg/dL   Alkaline phosphatase (APISO) 57 37 - 153 U/L   AST 20 10 - 35 U/L   ALT 24 6 - 29 U/L      Assessment & Plan:   Problem List Items Addressed This Visit     Mixed hyperlipidemia (Chronic)   Relevant Medications   WEGOVY 1 MG/0.5ML SOAJ   OSA on CPAP (Chronic)   Overweight (BMI 25.0-29.9) - Primary   Relevant Medications   WEGOVY 1 MG/0.5ML SOAJ   Pre-diabetes   Relevant Medications   WEGOVY 1 MG/0.5ML SOAJ      Overweight BMI >27 Comorbid factors Mixed Hyperlipidemia and Pre-Diabetes, and OSA on CPAP  Chronic weight loss efforts with dietary regimen (low carb, calorie restricted diet) and structured exercise regimen now >6+ months  Successful weight loss of 14 pounds  over 16 weeks with [facility] 0.5mg  weekly. Patient has experienced some side effects with the medication but has reached equilibrium now tolerating well.  Discussed the potential for increased side effects with dose  increase, but also the potential for further weight loss.  -Increase dose Wegovy to 1mg  weekly. -Send new prescription to [facility] pharmacy with 2 refills. -Check with insurance for coverage approval. -Follow-up appointment scheduled for April for weight check   OSA on CPAP Controlled with nightly CPAP usage, good adherence, benefits from therapy  ____________________________________________________ Additional Rx Information (May be used for Prior Authorization if required)  Medication name and Strength: Wegovy 1mg   Primary Diagnosis and ICD10 Code: Overweight BMI >27  (E66.3) Secondary Diagnosis and ICD10 Code: Mixed Hyperlipidemia (E78.2), Pre-Diabetes (R73.03), OSA on CPAP (G47.33) Previous Failed Medications none Quantity and Duration of New Medication: 2 mL for 28 days Additional Supporting Information: She has successfully lost >5% body weight in 4 months on Wegovy 0.5mg . This is for continuation of therapy, at now increased dose 1mg . ____________________________________________________   No orders of the defined types were placed in this encounter.   Meds ordered this encounter  Medications   WEGOVY 1 MG/0.5ML SOAJ    Sig: Inject 1 mg into the skin once a week.    Dispense:  2 mL    Refill:  2    Dose increase from 0.5mg  up to 1mg  weekly    Follow up plan: Return if symptoms worsen or fail to improve.   Saralyn Pilar, DO [facility] 04/24/2023, 3:32 PM

## 2023-04-24 NOTE — Patient Instructions (Addendum)
Thank you for coming to the office today.  Dose increase Wegovy from 0.5 up to 1mg  weekly, new order  Success on the first 4 months, Wegovy 0.5mg , weight loss 16 lbs, >5% body weight.  New order with +2 additional refills. Contact if need anything  Authorization can take 5-7 days.  Please schedule a Follow-up Appointment to: Return if symptoms worsen or fail to improve.  If you have any other questions or concerns, please feel free to call the office or send a message through MyChart. You may also schedule an earlier appointment if necessary.  Additionally, you may be receiving a survey about your experience at our office within a few days to 1 week by e-mail or mail. We value your feedback.  Saralyn Pilar, DO [facility], [place]

## 2023-05-12 ENCOUNTER — Ambulatory Visit: Payer: Self-pay | Admitting: Urology

## 2023-06-16 ENCOUNTER — Encounter: Payer: Self-pay | Admitting: Urology

## 2023-06-16 ENCOUNTER — Ambulatory Visit: Payer: BC Managed Care – PPO | Admitting: Urology

## 2023-06-16 VITALS — BP 147/74 | HR 67 | Ht 62.0 in | Wt 143.4 lb

## 2023-06-16 DIAGNOSIS — N3281 Overactive bladder: Secondary | ICD-10-CM

## 2023-06-16 LAB — URINALYSIS, COMPLETE
Bilirubin, UA: NEGATIVE
Glucose, UA: NEGATIVE
Ketones, UA: NEGATIVE
Leukocytes,UA: NEGATIVE
Nitrite, UA: NEGATIVE
Protein,UA: NEGATIVE
RBC, UA: NEGATIVE
Specific Gravity, UA: 1.01 (ref 1.005–1.030)
Urobilinogen, Ur: 0.2 mg/dL (ref 0.2–1.0)
pH, UA: 7 (ref 5.0–7.5)

## 2023-06-16 LAB — MICROSCOPIC EXAMINATION: RBC, Urine: NONE SEEN /HPF (ref 0–2)

## 2023-06-16 MED ORDER — MIRABEGRON ER 50 MG PO TB24: 50.0000 mg | ORAL_TABLET | Freq: Every day | ORAL | 11 refills | Status: AC

## 2023-06-16 NOTE — Patient Instructions (Signed)

## 2023-06-16 NOTE — Progress Notes (Addendum)
 I, Christine Berger, acting as a scribe for Christine Knapp, Christine Berger., have documented all relevant documentation on the behalf of Christine Knapp, Christine Berger, as directed by Christine Knapp, Christine Berger while in the presence of Christine Knapp, Christine Berger.  06/16/2023 2:12 PM   Christine Berger Dec 07, 1960 [id number]  Referring provider: Raina Bunting, DO [address] [place],  [place] [address]  Chief Complaint  Patient presents with   Over Active Bladder   Urologic history 1. Overactive bladder Myrbetriq  50 mg daily.   HPI: Christine Berger is a 63 y.o. female presents for annual follow-up.  Initially seen February 2024 after transfer of care from [facility] for overactive bladder. At that visit, she was doing well on Myrbetriq  50 mg and 1 year follow-up was scheduled.  She stated shortly after her visit last year, her insurance company would no longer cover Myrbetriq  and she noted return of her bothersome urinary symptoms including frequency, urgency, and urge incontinence.  She states her insurance company eventually approved Myrbetriq  50 mg daily in February 2025 and after starting she has seen significant improvement in her lower urinary tract symptoms. Denies dysuria, gross hematuria No flank, abdominal, or pelvic pain.    PMH: Past Medical History:  Diagnosis Date   Allergy    Anxiety    Asthma    Depression    Family history of premature CAD 06/24/2021   Coronary Calcium  Score 05/26/2020: Calcium  score 0.  Aortic atherosclerosis noted.   Fibromyalgia    Glaucoma    Hyperlipidemia    Neuropathy    folloewd pcp   Osteoporosis    Sleep apnea    On CPAP   Thyroid disease     Surgical History: Past Surgical History:  Procedure Laterality Date   INCONTINENCE SURGERY  10/27/2011   bladder sling   TRANSTHORACIC ECHOCARDIOGRAM  06/04/2020   EF 55 to 60%.  No RWMA.  GR 1 DD with mild LA dilation.  Normal mitral and aortic valves.  Normal RV size and function.  Normal RVP and RAP.   (Normal)   uterine ablation  04/28/2010    Home Medications:  Allergies as of 06/16/2023       Reactions   Aspartame Cough, Shortness Of Breath   Penicillins Other (See Comments)   Hives and tightness in chest   Erythromycin Nausea And Vomiting   Milk Protein Other (See Comments)   Statins Other (See Comments)   Muscle aches in past on pravastatin, Crestor, Zocor, Lipitor - while in [place]  Muscle aches in past on pravastatin, Crestor, Zocor, Lipitor - while in [place]         Medication List        Accurate as of June 16, 2023  2:12 PM. If you have any questions, ask your nurse or doctor.          albuterol  108 (90 Base) MCG/ACT inhaler Commonly known as: VENTOLIN  HFA INHALE 2 PUFFS INTO THE LUNGS EVERY 4 HOURS AS NEEDED FOR WHEEZING, SHORTNESS OF BREATH, OR COUGH   buPROPion  150 MG 24 hr tablet Commonly known as: WELLBUTRIN  XL Take 1 tablet (150 mg total) by mouth daily.   busPIRone  10 MG tablet Commonly known as: BUSPAR  Take 1 tablet (10 mg total) by mouth 2 (two) times daily as needed.   DULoxetine  60 MG capsule Commonly known as: CYMBALTA  Take 1 capsule (60 mg total) by mouth daily.   eletriptan  40 MG tablet Commonly known as: RELPAX  Take 1  tablet (40 mg total) by mouth as needed for migraine or headache (USE AS DIRECTED). May repeat in 2 hours if headache persists or recurs.   estradiol 2 MG tablet Commonly known as: ESTRACE Take 2 mg by mouth daily.   ezetimibe  10 MG tablet Commonly known as: ZETIA  Take 1 tablet (10 mg total) by mouth daily.   fluticasone 50 MCG/ACT nasal spray Commonly known as: FLONASE Place 2 sprays into both nostrils daily as needed for allergies.   ipratropium 0.06 % nasal spray Commonly known as: ATROVENT  Place 2 sprays into both nostrils 4 (four) times daily as needed for rhinitis.   levothyroxine  75 MCG tablet Commonly known as: SYNTHROID  Take 1 tablet (75 mcg total) by mouth daily.   methocarbamol 500 MG  tablet Commonly known as: ROBAXIN Take 500 mg by mouth 3 times/day as needed-between meals & bedtime for muscle spasms.   metroNIDAZOLE 0.75 % cream Commonly known as: METROCREAM Apply 1 application topically daily.   mirabegron  ER 50 MG Tb24 tablet Commonly known as: MYRBETRIQ  Take 1 tablet (50 mg total) by mouth daily.   montelukast  10 MG tablet Commonly known as: SINGULAIR  Take 10 mg by mouth daily as needed.   omeprazole  40 MG capsule Commonly known as: PRILOSEC Take 40 mg by mouth daily as needed.   ondansetron  4 MG disintegrating tablet Commonly known as: ZOFRAN -ODT Take 1 tablet (4 mg total) by mouth every 8 (eight) hours as needed for nausea or vomiting.   Pitavastatin  Calcium  4 MG Tabs Take 1 tablet (4 mg total) by mouth daily.   Premarin vaginal cream Generic drug: conjugated estrogens Premarin 0.625 mg/gram vaginal cream  INSERT 0.5 G TWICE A WEEK BY VAGINAL ROUTE AS NEEDED FOR 30 DAYS.   progesterone 100 MG capsule Commonly known as: PROMETRIUM Take 100 mg by mouth at bedtime.   Wegovy  1 MG/0.5ML Soaj Generic drug: Semaglutide -Weight Management Inject 1 mg into the skin once a week.        Allergies:  Allergies  Allergen Reactions   Aspartame Cough and Shortness Of Breath   Penicillins Other (See Comments)    Hives and tightness in chest   Erythromycin Nausea And Vomiting   Milk Protein Other (See Comments)   Statins Other (See Comments)    Muscle aches in past on pravastatin, Crestor, Zocor, Lipitor - while in [place]  Muscle aches in past on pravastatin, Crestor, Zocor, Lipitor - while in [place]     Family History: Family History  Problem Relation Age of Onset   Stroke Mother    Diabetes Mother    Cancer Mother    Anuerysm Mother    Depression Mother    Heart attack Father    Arthritis Sister    Crohn's disease Sister    Breast cancer Neg Hx     Social History:  reports that she has never smoked. She has never used smokeless tobacco.  She reports that she does not currently use alcohol after a past usage of about 1.0 - 2.0 standard drink of alcohol per week. She reports that she does not use drugs.   Physical Exam: BP (!) 147/74   Pulse 67   Ht 5' 2 (1.575 m)   Wt 143 lb 7 oz (65.1 kg)   BMI 26.24 kg/m   Constitutional:  Alert and oriented, No acute distress. HEENT: [place] AT Respiratory: Normal respiratory effort, no increased work of breathing. Psychiatric: Normal mood and affect.  Urinalysis Dipstick/microscopy negative.    Assessment & Plan:  1. Overactive bladder Significant improvement in symptoms since restarting Myrbetriq ; refill sent to pharmacy. Continue annual follow-up.  If her symptoms worsen, we discussed that FDA has approved a combination therapy of a beta 3 agonist with an anticholinergic  I have reviewed the above documentation for accuracy and completeness, and I agree with the above.   Christine Knapp, Christine Berger  [facility] [address], [address] [place], [place] [address] [phone]

## 2023-06-19 ENCOUNTER — Telehealth: Payer: Self-pay

## 2023-06-19 NOTE — Telephone Encounter (Signed)
 Tine Mabee (Key: [id number]) Rx #: [id number] [phone] 1MG /0.5ML auto-injectors Form [employer] [place] Commercial Electronic Request Form Created 19 hours ago Sent to Plan 4 minutes ago Determination Wait for Questions [employer] [place] Commercial [facility] 2017 typically responds with questions in less than 15 minutes, but may take up to 24 hours.

## 2023-06-20 NOTE — Telephone Encounter (Signed)
 Devika Dragovich (Key: [id number]) Rx #: [id number] [phone] 1MG /0.5ML auto-injectors Form [employer] [place] Commercial Electronic Request Form Created 2 days ago Sent to Plan 24 hours ago Plan Response 23 hours ago Submit Clinical Questions 23 hours ago Determination Favorable 29 minutes ago Your prior authorization for Reginal Lutes has been approved! More Info Personalized support and financial assistance may be available through the [employer] program. For more information, and to see program requirements, click on the More Info button to the right.  Message from plan: Approved. . Authorization Expiration Date: June 18, 2024.

## 2023-07-12 ENCOUNTER — Other Ambulatory Visit: Payer: Self-pay

## 2023-07-12 DIAGNOSIS — E782 Mixed hyperlipidemia: Secondary | ICD-10-CM | POA: Diagnosis not present

## 2023-07-12 DIAGNOSIS — I700 Atherosclerosis of aorta: Secondary | ICD-10-CM | POA: Diagnosis not present

## 2023-07-12 DIAGNOSIS — R7303 Prediabetes: Secondary | ICD-10-CM

## 2023-07-12 DIAGNOSIS — E66811 Obesity, class 1: Secondary | ICD-10-CM

## 2023-07-12 DIAGNOSIS — E039 Hypothyroidism, unspecified: Secondary | ICD-10-CM | POA: Diagnosis not present

## 2023-07-12 DIAGNOSIS — Z0000 Encounter for general adult medical examination without abnormal findings: Secondary | ICD-10-CM

## 2023-07-13 LAB — CBC WITH DIFFERENTIAL/PLATELET
Absolute Lymphocytes: 1580 {cells}/uL (ref 850–3900)
Absolute Monocytes: 352 {cells}/uL (ref 200–950)
Basophils Absolute: 40 {cells}/uL (ref 0–200)
Basophils Relative: 0.9 %
Eosinophils Absolute: 70 {cells}/uL (ref 15–500)
Eosinophils Relative: 1.6 %
HCT: 42.1 % (ref 35.0–45.0)
Hemoglobin: 13.8 g/dL (ref 11.7–15.5)
MCH: 29.9 pg (ref 27.0–33.0)
MCHC: 32.8 g/dL (ref 32.0–36.0)
MCV: 91.1 fL (ref 80.0–100.0)
MPV: 10.9 fL (ref 7.5–12.5)
Monocytes Relative: 8 %
Neutro Abs: 2358 {cells}/uL (ref 1500–7800)
Neutrophils Relative %: 53.6 %
Platelets: 212 10*3/uL (ref 140–400)
RBC: 4.62 10*6/uL (ref 3.80–5.10)
RDW: 12 % (ref 11.0–15.0)
Total Lymphocyte: 35.9 %
WBC: 4.4 10*3/uL (ref 3.8–10.8)

## 2023-07-13 LAB — COMPLETE METABOLIC PANEL WITHOUT GFR
AG Ratio: 2 (calc) (ref 1.0–2.5)
ALT: 23 U/L (ref 6–29)
AST: 19 U/L (ref 10–35)
Albumin: 4.3 g/dL (ref 3.6–5.1)
Alkaline phosphatase (APISO): 49 U/L (ref 37–153)
BUN: 11 mg/dL (ref 7–25)
CO2: 30 mmol/L (ref 20–32)
Calcium: 8.9 mg/dL (ref 8.6–10.4)
Chloride: 101 mmol/L (ref 98–110)
Creat: 0.66 mg/dL (ref 0.50–1.05)
Globulin: 2.2 g/dL (ref 1.9–3.7)
Glucose, Bld: 95 mg/dL (ref 65–99)
Potassium: 4.5 mmol/L (ref 3.5–5.3)
Sodium: 140 mmol/L (ref 135–146)
Total Bilirubin: 0.6 mg/dL (ref 0.2–1.2)
Total Protein: 6.5 g/dL (ref 6.1–8.1)

## 2023-07-13 LAB — LIPID PANEL
Cholesterol: 166 mg/dL (ref <200)
HDL: 68 mg/dL (ref >=50)
LDL Cholesterol (Calc): 78 mg/dL
Non-HDL Cholesterol (Calc): 98 mg/dL (ref <130)
Total CHOL/HDL Ratio: 2.4 (calc) (ref <5.0)
Triglycerides: 123 mg/dL (ref <150)

## 2023-07-13 LAB — TSH: TSH: 1.63 m[IU]/L (ref 0.40–4.50)

## 2023-07-13 LAB — HEMOGLOBIN A1C
Hgb A1c MFr Bld: 5.6 % (ref <5.7)
Mean Plasma Glucose: 114 mg/dL
eAG (mmol/L): 6.3 mmol/L

## 2023-07-13 LAB — T4, FREE: Free T4: 1 ng/dL (ref 0.8–1.8)

## 2023-07-19 ENCOUNTER — Encounter: Payer: Self-pay | Admitting: Family Medicine

## 2023-07-19 ENCOUNTER — Ambulatory Visit (INDEPENDENT_AMBULATORY_CARE_PROVIDER_SITE_OTHER): Payer: Self-pay | Admitting: Family Medicine

## 2023-07-19 VITALS — BP 122/68 | HR 71 | Ht 62.0 in | Wt 147.6 lb

## 2023-07-19 DIAGNOSIS — Z0000 Encounter for general adult medical examination without abnormal findings: Secondary | ICD-10-CM | POA: Diagnosis not present

## 2023-07-19 DIAGNOSIS — Z23 Encounter for immunization: Secondary | ICD-10-CM | POA: Diagnosis not present

## 2023-07-19 DIAGNOSIS — I700 Atherosclerosis of aorta: Secondary | ICD-10-CM

## 2023-07-19 DIAGNOSIS — E663 Overweight: Secondary | ICD-10-CM | POA: Diagnosis not present

## 2023-07-19 DIAGNOSIS — R7303 Prediabetes: Secondary | ICD-10-CM

## 2023-07-19 DIAGNOSIS — E782 Mixed hyperlipidemia: Secondary | ICD-10-CM

## 2023-07-19 DIAGNOSIS — G4733 Obstructive sleep apnea (adult) (pediatric): Secondary | ICD-10-CM

## 2023-07-19 DIAGNOSIS — F3341 Major depressive disorder, recurrent, in partial remission: Secondary | ICD-10-CM

## 2023-07-19 MED ORDER — BUPROPION HCL ER (XL) 150 MG PO TB24: 150.0000 mg | ORAL_TABLET | Freq: Every day | ORAL | 3 refills | Status: AC

## 2023-07-19 MED ORDER — WEGOVY 1 MG/0.5ML ~~LOC~~ SOAJ
1.0000 mg | SUBCUTANEOUS | 2 refills | Status: DC
Start: 2023-07-19 — End: 2023-10-18

## 2023-07-19 NOTE — Patient Instructions (Addendum)
 Thank you for coming to the office today.  Tdap today  Future prevnar 20  - pneumonia vaccine at the pharmacy when ready, let me know date / location etc, and we can update.  Due for updated Pap Smear this year 2025  Keep on yearly Mammogram.  Refill wegovy  at same dose 1mg , maintenance   Please schedule a Follow-up Appointment to: Return in about 3 months (around 10/18/2023) for 3 month weight check, refills.  If you have any other questions or concerns, please feel free to call the office or send a message through MyChart. You may also schedule an earlier appointment if necessary.  Additionally, you may be receiving a survey about your experience at our office within a few days to 1 week by e-mail or mail. We value your feedback.  Domingo Friend, DO [facility], [place]

## 2023-07-19 NOTE — Progress Notes (Signed)
 Subjective:    Patient ID: Christine Berger, female    DOB: [birth year], 63 y.o.   MRN: [id number]  Christine Berger is a 63 y.o. female presenting on 07/19/2023 for Annual Exam   HPI   Discussed the use of AI scribe software for clinical note transcription with the patient, who gave verbal consent to proceed.  History of Present Illness   Christine Berger is a 63 year old female who presents for an annual physical exam.  She was diagnosed with rosacea and takes medication for it. She also has a history of foot pain, diagnosed as Equinus and metatarsalgia, and is under the care of a podiatrist.  Pre Diabetes She has a history of prediabetes, currently well-controlled with Wegovy  1 mg. Recent blood work shows an A1C of 5.6, indicating she is no longer in the prediabetic range. She has lost six pounds since January, now weighing 147 pounds, with a BMI of 27.   History of GERD / Silent Reflux   Overactive Bladder Bladder Spasms Previously followed w/ Urology, now no longer seen. Still has med Taking Myrbetriq  50mg  daily AS NEEDED. Not using every day.   HYPERLIPIDEMIA: Followed by Cardiologist She is on a combination of Zetia  and pitavastatin  for cholesterol management, with her LDL at 78.  Tolerating well without side effects or myalgia   Hypothyroidism Her thyroid function is stable on 75 mcg of medication, with a TSH of 1.63 and T4 of 1.8. On Levothyroxine  75mcg daily  Major Recurrent Depression, in partial remission Anxiety Doing well on current medication Wellbutrin  XL 150mg , Buspar , Duloxetine    OSA on CPAP - Patient reports prior history of dx OSA and on CPAP for years, prior to treatment initial symptoms were snoring, daytime sleepiness and fatigue, has had several sleep studies in the past. - Today reports that sleep apnea is well controlled. She uses the CPAP machine every night. Tolerates the machine well, and thinks that sleeps better with it and feels good. No  new concerns or symptoms.   Health Maintenance:   Cologuard last completed 09/28/22 negative, repeat 2027  Pap Smear last done 06/16/20, next due 3 years, or 2025   Next mammogram due yearly, 12/2021, she will relocate to new GYN OBGYN Dr Asencion Blacksmith - [facility] [place] - initial screening Mammogram 2024, abnormal, sent for diagnostic 01/09/23 Mammo diagnostic + ultrasound, the area in question L upper outer breast, found to have dense fibroglandular tissue, false alarm, negative, repeat yearly  She is due for a tetanus booster as her last shot was in 2014.       07/19/2023    2:05 PM 04/24/2023    3:20 PM 01/13/2023    1:08 PM  Depression screen PHQ 2/9  Decreased Interest 1 1 1   Down, Depressed, Hopeless 0 0 0  PHQ - 2 Score 1 1 1   Altered sleeping 1 1 0  Tired, decreased energy 1 2 1   Change in appetite 1 0 1  Feeling bad or failure about yourself  0 0 0  Trouble concentrating 2 0 1  Moving slowly or fidgety/restless 0 0 0  Suicidal thoughts 0 0 0  PHQ-9 Score 6 4 4   Difficult doing work/chores Not difficult at all Somewhat difficult Not difficult at all       07/19/2023    2:06 PM 04/24/2023    3:20 PM 01/13/2023    1:09 PM 07/13/2022    1:49 PM  GAD 7 : Generalized Anxiety Score  Nervous, Anxious, on Edge 0 0 0 2  Control/stop worrying 0 0 0 0  Worry too much - different things 0 0 0 0  Trouble relaxing 0 0 0 2  Restless 0 0 0 2  Easily annoyed or irritable 0 0 0 1  Afraid - awful might happen 0 0 0 0  Total GAD 7 Score 0 0 0 7  Anxiety Difficulty Not difficult at all        Past Medical History:  Diagnosis Date   Allergy    Anemia 2008   Anxiety    Arthritis 1982   Asthma    Cataract    Depression    Family history of premature CAD 06/24/2021   Coronary Calcium  Score 05/26/2020: Calcium  score 0.  Aortic atherosclerosis noted.   Fibromyalgia    GERD (gastroesophageal reflux disease) 1985   Glaucoma    Hyperlipidemia    Hypertension 2021   Neuropathy     folloewd pcp   Osteoporosis    Sleep apnea    On CPAP   Thyroid disease    Past Surgical History:  Procedure Laterality Date   EYE SURGERY  04/2018   INCONTINENCE SURGERY  10/27/2011   bladder sling   TRANSTHORACIC ECHOCARDIOGRAM  06/04/2020   EF 55 to 60%.  No RWMA.  GR 1 DD with mild LA dilation.  Normal mitral and aortic valves.  Normal RV size and function.  Normal RVP and RAP.  (Normal)   uterine ablation  04/28/2010   Social History   Socioeconomic History   Marital status: Married    Spouse name: Not on file   Number of children: Not on file   Years of education: Not on file   Highest education level: Master's degree (e.g., MA, MS, MEng, MEd, MSW, MBA)  Occupational History   Not on file  Tobacco Use   Smoking status: Never   Smokeless tobacco: Never  Vaping Use   Vaping status: Never Used  Substance and Sexual Activity   Alcohol use: Not Currently    Alcohol/week: 1.0 - 2.0 standard drink of alcohol    Types: 1 - 2 Glasses of wine per week    Comment: monthly   Drug use: No   Sexual activity: Yes    Birth control/protection: Post-menopausal  Other Topics Concern   Not on file  Social History Narrative   Not on file   Social Drivers of Health   Financial Resource Strain: Low Risk  (07/18/2023)   Overall Financial Resource Strain (CARDIA)    Difficulty of Paying Living Expenses: Not very hard  Food Insecurity: No Food Insecurity (07/18/2023)   Hunger Vital Sign    Worried About Running Out of Food in the Last Year: Never true    Ran Out of Food in the Last Year: Never true  Transportation Needs: No Transportation Needs (07/18/2023)   PRAPARE - Administrator, Civil Service (Medical): No    Lack of Transportation (Non-Medical): No  Physical Activity: Unknown (07/18/2023)   Exercise Vital Sign    Days of Exercise per Week: 0 days    Minutes of Exercise per Session: Not on file  Stress: No Stress Concern Present (07/18/2023)   [employer] of  Occupational Health - Occupational Stress Questionnaire    Feeling of Stress : Not at all  Social Connections: Socially Isolated (07/18/2023)   Social Connection and Isolation Panel [NHANES]    Frequency of Communication with Friends and Family: Once a  week    Frequency of Social Gatherings with Friends and Family: Once a week    Attends Religious Services: Never    Database administrator or Organizations: No    Attends Engineer, structural: Not on file    Marital Status: Married  Catering manager Violence: Not on file   Family History  Problem Relation Age of Onset   Stroke Mother    Diabetes Mother    Cancer Mother    Anuerysm Mother    Depression Mother    Arthritis Mother    Heart attack Father    Hearing loss Father    Heart disease Father    Hypertension Father    Arthritis Sister    Crohn's disease Sister    Birth defects Sister    Depression Sister    Hypertension Sister    Hyperlipidemia Paternal Actor    ADD / ADHD Son    Asthma Son    Learning disabilities Son    ADD / ADHD Daughter    Anxiety disorder Daughter    Asthma Daughter    Depression Daughter    Learning disabilities Daughter    Hyperlipidemia Paternal Aunt    Breast cancer Neg Hx    Current Outpatient Medications on File Prior to Visit  Medication Sig   albuterol  (VENTOLIN  HFA) 108 (90 Base) MCG/ACT inhaler INHALE 2 PUFFS INTO THE LUNGS EVERY 4 HOURS AS NEEDED FOR WHEEZING, SHORTNESS OF BREATH, OR COUGH   busPIRone  (BUSPAR ) 10 MG tablet Take 1 tablet (10 mg total) by mouth 2 (two) times daily as needed.   conjugated estrogens (PREMARIN) vaginal cream Premarin 0.625 mg/gram vaginal cream  INSERT 0.5 G TWICE A WEEK BY VAGINAL ROUTE AS NEEDED FOR 30 DAYS.   DULoxetine  (CYMBALTA ) 60 MG capsule Take 1 capsule (60 mg total) by mouth daily.   estradiol (ESTRACE) 2 MG tablet Take 2 mg by mouth daily.    ezetimibe  (ZETIA ) 10 MG tablet Take 1 tablet (10 mg total) by mouth daily.    fluticasone (FLONASE) 50 MCG/ACT nasal spray Place 2 sprays into both nostrils daily as needed for allergies.   levothyroxine  (SYNTHROID ) 75 MCG tablet Take 1 tablet (75 mcg total) by mouth daily.   methocarbamol (ROBAXIN) 500 MG tablet Take 500 mg by mouth 3 times/day as needed-between meals & bedtime for muscle spasms.   metroNIDAZOLE (METROCREAM) 0.75 % cream Apply 1 application topically daily.   mirabegron  ER (MYRBETRIQ ) 50 MG TB24 tablet Take 1 tablet (50 mg total) by mouth daily.   montelukast  (SINGULAIR ) 10 MG tablet Take 10 mg by mouth daily as needed.   omeprazole  (PRILOSEC) 40 MG capsule Take 40 mg by mouth daily as needed.   ondansetron  (ZOFRAN -ODT) 4 MG disintegrating tablet Take 1 tablet (4 mg total) by mouth every 8 (eight) hours as needed for nausea or vomiting.   Pitavastatin  Calcium  4 MG TABS Take 1 tablet (4 mg total) by mouth daily.   progesterone (PROMETRIUM) 100 MG capsule Take 100 mg by mouth at bedtime.   eletriptan  (RELPAX ) 40 MG tablet Take 1 tablet (40 mg total) by mouth as needed for migraine or headache (USE AS DIRECTED). May repeat in 2 hours if headache persists or recurs.   ipratropium (ATROVENT ) 0.06 % nasal spray Place 2 sprays into both nostrils 4 (four) times daily as needed for rhinitis. (Patient not taking: Reported on 07/19/2023)   No current facility-administered medications on file prior to visit.    Review of Systems  Constitutional:  Negative for activity change, appetite change, chills, diaphoresis, fatigue and fever.  HENT:  Negative for congestion and hearing loss.   Eyes:  Negative for visual disturbance.  Respiratory:  Negative for cough, chest tightness, shortness of breath and wheezing.   Cardiovascular:  Negative for chest pain, palpitations and leg swelling.  Gastrointestinal:  Negative for abdominal pain, constipation, diarrhea, nausea and vomiting.  Genitourinary:  Negative for dysuria, frequency and hematuria.  Musculoskeletal:  Negative  for arthralgias and neck pain.  Skin:  Negative for rash.  Neurological:  Negative for dizziness, weakness, light-headedness, numbness and headaches.  Hematological:  Negative for adenopathy.  Psychiatric/Behavioral:  Negative for behavioral problems, dysphoric mood and sleep disturbance.    Per HPI unless specifically indicated above     Objective:    BP 122/68 (BP Location: Left Arm, Patient Position: Sitting, Cuff Size: Normal)   Pulse 71   Ht 5\' 2"  (1.575 m)   Wt 147 lb 9.6 oz (67 kg)   SpO2 96%   BMI 27.00 kg/m   Wt Readings from Last 3 Encounters:  07/19/23 147 lb 9.6 oz (67 kg)  06/16/23 143 lb 7 oz (65.1 kg)  04/24/23 153 lb (69.4 kg)    Physical Exam Vitals and nursing note reviewed.  Constitutional:      General: She is not in acute distress.    Appearance: She is well-developed. She is not diaphoretic.     Comments: Well-appearing, comfortable, cooperative  HENT:     Head: Normocephalic and atraumatic.     Right Ear: Tympanic membrane, ear canal and external ear normal. There is no impacted cerumen.     Left Ear: Tympanic membrane, ear canal and external ear normal. There is no impacted cerumen.  Eyes:     General:        Right eye: No discharge.        Left eye: No discharge.     Conjunctiva/sclera: Conjunctivae normal.     Pupils: Pupils are equal, round, and reactive to light.  Neck:     Thyroid: No thyromegaly.     Vascular: No carotid bruit.  Cardiovascular:     Rate and Rhythm: Normal rate and regular rhythm.     Pulses: Normal pulses.     Heart sounds: Normal heart sounds. No murmur heard. Pulmonary:     Effort: Pulmonary effort is normal. No respiratory distress.     Breath sounds: Normal breath sounds. No wheezing or rales.  Abdominal:     General: Bowel sounds are normal. There is no distension.     Palpations: Abdomen is soft. There is no mass.     Tenderness: There is no abdominal tenderness.  Musculoskeletal:        General: No tenderness.  Normal range of motion.     Cervical back: Normal range of motion and neck supple.     Right lower leg: No edema.     Left lower leg: No edema.     Comments: Upper / Lower Extremities: - Normal muscle tone, strength bilateral upper extremities 5/5, lower extremities 5/5  Lymphadenopathy:     Cervical: No cervical adenopathy.  Skin:    General: Skin is warm and dry.     Findings: No erythema or rash.  Neurological:     Mental Status: She is alert and oriented to person, place, and time.     Comments: Distal sensation intact to light touch all extremities  Psychiatric:  Mood and Affect: Mood normal.        Behavior: Behavior normal.        Thought Content: Thought content normal.     Comments: Well groomed, good eye contact, normal speech and thoughts     Results for orders placed or performed in visit on 07/12/23  T4, free   Collection Time: 07/12/23  8:51 AM  Result Value Ref Range   Free T4 1.0 0.8 - 1.8 ng/dL  TSH   Collection Time: 07/12/23  8:51 AM  Result Value Ref Range   TSH 1.63 0.40 - 4.50 mIU/L  CBC with Differential/Platelet   Collection Time: 07/12/23  8:51 AM  Result Value Ref Range   WBC 4.4 3.8 - 10.8 Thousand/uL   RBC 4.62 3.80 - 5.10 Million/uL   Hemoglobin 13.8 11.7 - 15.5 g/dL   HCT 16.1 09.6 - 04.5 %   MCV 91.1 80.0 - 100.0 fL   MCH 29.9 27.0 - 33.0 pg   MCHC 32.8 32.0 - 36.0 g/dL   RDW 40.9 81.1 - 91.4 %   Platelets 212 140 - 400 Thousand/uL   MPV 10.9 7.5 - 12.5 fL   Neutro Abs 2,358 1,500 - 7,800 cells/uL   Absolute Lymphocytes 1,580 850 - 3,900 cells/uL   Absolute Monocytes 352 200 - 950 cells/uL   Eosinophils Absolute 70 15 - 500 cells/uL   Basophils Absolute 40 0 - 200 cells/uL   Neutrophils Relative % 53.6 %   Total Lymphocyte 35.9 %   Monocytes Relative 8.0 %   Eosinophils Relative 1.6 %   Basophils Relative 0.9 %  COMPLETE METABOLIC PANEL WITH GFR   Collection Time: 07/12/23  8:51 AM  Result Value Ref Range   Glucose, Bld 95  65 - 99 mg/dL   BUN 11 7 - 25 mg/dL   Creat 7.82 9.56 - 2.13 mg/dL   BUN/Creatinine Ratio SEE NOTE: 6 - 22 (calc)   Sodium 140 135 - 146 mmol/L   Potassium 4.5 3.5 - 5.3 mmol/L   Chloride 101 98 - 110 mmol/L   CO2 30 20 - 32 mmol/L   Calcium  8.9 8.6 - 10.4 mg/dL   Total Protein 6.5 6.1 - 8.1 g/dL   Albumin 4.3 3.6 - 5.1 g/dL   Globulin 2.2 1.9 - 3.7 g/dL (calc)   AG Ratio 2.0 1.0 - 2.5 (calc)   Total Bilirubin 0.6 0.2 - 1.2 mg/dL   Alkaline phosphatase (APISO) 49 37 - 153 U/L   AST 19 10 - 35 U/L   ALT 23 6 - 29 U/L  Lipid panel   Collection Time: 07/12/23  8:51 AM  Result Value Ref Range   Cholesterol 166 <200 mg/dL   HDL 68 > OR = 50 mg/dL   Triglycerides 086 <578 mg/dL   LDL Cholesterol (Calc) 78 mg/dL (calc)   Total CHOL/HDL Ratio 2.4 <5.0 (calc)   Non-HDL Cholesterol (Calc) 98 <469 mg/dL (calc)  Hemoglobin G2X   Collection Time: 07/12/23  8:51 AM  Result Value Ref Range   Hgb A1c MFr Bld 5.6 <5.7 %   Mean Plasma Glucose 114 mg/dL   eAG (mmol/L) 6.3 mmol/L      Assessment & Plan:   Problem List Items Addressed This Visit     Mixed hyperlipidemia (Chronic)   Relevant Medications   WEGOVY  1 MG/0.5ML SOAJ   OSA on CPAP (Chronic)   Major depressive disorder, recurrent episode, in partial remission (HCC)   Relevant Medications   buPROPion  (WELLBUTRIN  XL)  150 MG 24 hr tablet   Overweight (BMI 25.0-29.9)   Relevant Medications   WEGOVY  1 MG/0.5ML SOAJ   Pre-diabetes   Relevant Medications   WEGOVY  1 MG/0.5ML SOAJ   Other Visit Diagnoses       Annual physical exam    -  Primary     Need for diphtheria-tetanus-pertussis (Tdap) vaccine       Relevant Orders   Tdap vaccine greater than or equal to 7yo IM (Completed)     Aortic atherosclerosis (HCC)            Updated Health Maintenance information Reviewed recent lab results with patient Encouraged improvement to lifestyle with diet and exercise Goal of weight loss  Wellness Visit Annual wellness visit  completed. Vaccination status, cancer screenings, lab results, and medication management reviewed. Administered TDAP vaccine.  Discussed pneumococcal vaccination guidelines for those 50 and older. She chose to receive Prevnar 20 at the pharmacy. - Recommend Prevnar 20 pneumonia vaccine at pharmacy when ready. - Follow up with GYN for Pap smear this year 2025 (3 years since last 2022) - Continue yearly mammograms. (Fall 2025 per GYN) - Repeat Cologuard in 2027.  Prediabetes Hemoglobin A1c improved to 5.6%, below prediabetes threshold, prior range >6.0% Weight loss achieved, BMI now 27. Discussed maintaining Wegovy  dose for weight and glycemic control. She concerned about weight regain if medication stopped. - Continue Wegovy  1 mg for weight maintenance and glycemic control.  - Schedule follow-up in 3 months for weight check and medication review.  Hyperlipidemia Hyperlipidemia well-controlled. LDL cholesterol at 78 mg/dL. - Continue Zetia  and pitavastatin  as prescribed.  Hypothyroidism Hypothyroidism well-managed. TSH and T4 levels normal. - Continue levothyroxine  75 mcg daily.  Aortic Atherosclerosis Prior imaging with ASCVD. Has had CT Coronary Calcium  score already 2022 On statin therapy currently  Depression recurrent partial remission Depression well-controlled. Mood questionnaire indicates good control. - Renew Wellbutrin  prescription XL 150mg  daily, continue other medications       Orders Placed This Encounter  Procedures   Tdap vaccine greater than or equal to 7yo IM    Meds ordered this encounter  Medications   WEGOVY  1 MG/0.5ML SOAJ    Sig: Inject 1 mg into the skin once a week.    Dispense:  2 mL    Refill:  2    Renewal maintenance dose 1mg    buPROPion  (WELLBUTRIN  XL) 150 MG 24 hr tablet    Sig: Take 1 tablet (150 mg total) by mouth daily.    Dispense:  90 tablet    Refill:  3    Add refills     Follow up plan: Return in about 3 months (around  10/18/2023) for 3 month weight check, refills.  Domingo Friend, DO [facility] 07/19/2023, 1:10 PM

## 2023-07-29 ENCOUNTER — Other Ambulatory Visit: Payer: Self-pay | Admitting: Family Medicine

## 2023-07-29 DIAGNOSIS — E039 Hypothyroidism, unspecified: Secondary | ICD-10-CM

## 2023-08-01 NOTE — Telephone Encounter (Signed)
 Requested Prescriptions  Pending Prescriptions Disp Refills   levothyroxine  (SYNTHROID ) 75 MCG tablet [Pharmacy Med Name: Levothyroxine  Sodium 75 MCG Oral Tablet] 90 tablet 3    Sig: Take 1 tablet by mouth once daily     Endocrinology:  Hypothyroid Agents Passed - 08/01/2023 11:18 AM      Passed - TSH in normal range and within 360 days    TSH  Date Value Ref Range Status  07/12/2023 1.63 0.40 - 4.50 mIU/L Final         Passed - Valid encounter within last 12 months    Recent Outpatient Visits           1 week ago Annual physical exam   [facility] Raina Bunting, DO       Future Appointments             In 10 months Stoioff, Kizzie Perks, MD [facility]

## 2023-08-22 ENCOUNTER — Ambulatory Visit: Admitting: Podiatry

## 2023-09-14 ENCOUNTER — Encounter: Payer: Self-pay | Admitting: Urology

## 2023-09-14 ENCOUNTER — Other Ambulatory Visit: Payer: Self-pay | Admitting: Urology

## 2023-09-14 MED ORDER — SOLIFENACIN SUCCINATE 5 MG PO TABS: 5.0000 mg | ORAL_TABLET | Freq: Every day | ORAL | 2 refills | Status: DC

## 2023-09-19 ENCOUNTER — Encounter: Payer: Self-pay | Admitting: Podiatry

## 2023-09-19 ENCOUNTER — Ambulatory Visit: Admitting: Podiatry

## 2023-09-19 VITALS — Ht 62.0 in | Wt 147.6 lb

## 2023-09-19 DIAGNOSIS — G5763 Lesion of plantar nerve, bilateral lower limbs: Secondary | ICD-10-CM | POA: Diagnosis not present

## 2023-09-19 DIAGNOSIS — M216X2 Other acquired deformities of left foot: Secondary | ICD-10-CM

## 2023-09-19 DIAGNOSIS — M216X1 Other acquired deformities of right foot: Secondary | ICD-10-CM | POA: Diagnosis not present

## 2023-09-19 NOTE — Progress Notes (Signed)
 Chief Complaint  Patient presents with   Foot Orthotics    Pt is here to discuss orthotics for equinus and metatarsalgia., pt received orthotics last year wants to see if she can get another pair for her dress shoes.     HPI: 63 y.o. female presenting today for follow-up evaluation of bilateral forefoot pain and metatarsalgia secondary to tight posterior complex.  Patient states that the orthotics help significantly.  She is requesting an additional pair of orthotics today  Brief history: Patient has developed pain and tenderness over the past several years to the forefoot.  She says that she has tried arch supports with heel lifts but she continues to have pain and tenderness to the forefoot.  This has been an ongoing chronic problem for over 10 years despite shoe gear modifications.  Patient has been to physical therapy which seems to help minimally  Past Medical History:  Diagnosis Date   Allergy    Anemia 2008   Anxiety    Arthritis 1982   Asthma    Cataract    Depression    Family history of premature CAD 06/24/2021   Coronary Calcium  Score 05/26/2020: Calcium  score 0.  Aortic atherosclerosis noted.   Fibromyalgia    GERD (gastroesophageal reflux disease) 1985   Glaucoma    Hyperlipidemia    Hypertension 2021   Neuropathy    folloewd pcp   Osteoporosis    Sleep apnea    On CPAP   Thyroid disease     Past Surgical History:  Procedure Laterality Date   EYE SURGERY  04/2018   INCONTINENCE SURGERY  10/27/2011   bladder sling   TRANSTHORACIC ECHOCARDIOGRAM  06/04/2020   EF 55 to 60%.  No RWMA.  GR 1 DD with mild LA dilation.  Normal mitral and aortic valves.  Normal RV size and function.  Normal RVP and RAP.  (Normal)   uterine ablation  04/28/2010    Allergies  Allergen Reactions   Aspartame Cough and Shortness Of Breath   Penicillins Other (See Comments)    Hives and tightness in chest   Erythromycin Nausea And Vomiting   Milk Protein Other (See Comments)    Statins Other (See Comments)    Muscle aches in past on pravastatin, Crestor, Zocor, Lipitor - while in [place]  Muscle aches in past on pravastatin, Crestor, Zocor, Lipitor - while in [place]      Physical Exam: General: The patient is alert and oriented x3 in no acute distress.  Dermatology: Skin is warm, dry and supple bilateral lower extremities.   Vascular: Palpable pedal pulses bilaterally. Capillary refill within normal limits.  No appreciable edema.  No erythema.  Neurological: Grossly intact via light touch  Musculoskeletal Exam: Unchanged.  Limited ankle joint dorsiflexion.  Positive Silfverskiold test.  Findings consistent with a tight posterior complex and gastrocnemius equinus.  There is associated tenderness with palpation throughout the forefoot  Radiographic Exam B/L feet 08/09/2022:  Normal osseous mineralization. Joint spaces preserved.  No fractures or osseous irregularities noted.  Assessment/Plan of Care: 1.  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 Thresa EMERSON Sar, DPM    [address]  [place], [place] [address]                Office [phone]  Fax [phone]

## 2023-09-22 ENCOUNTER — Telehealth: Payer: Self-pay

## 2023-09-22 NOTE — Telephone Encounter (Signed)
 [name] patient would like a dress pair of orthotics scans from [facility] still on file order placed today  Christine Berger

## 2023-10-05 ENCOUNTER — Telehealth: Payer: Self-pay

## 2023-10-05 NOTE — Telephone Encounter (Signed)
 LVM refurb orthotics are here pt can pu no appt needed  Balance: $90 due

## 2023-10-10 NOTE — Telephone Encounter (Signed)
 These are not refurbished these are new patient has discounted price of $271 upon pick up  [place]

## 2023-10-18 ENCOUNTER — Ambulatory Visit: Admitting: Family Medicine

## 2023-10-18 ENCOUNTER — Encounter: Payer: Self-pay | Admitting: Family Medicine

## 2023-10-18 DIAGNOSIS — R7303 Prediabetes: Secondary | ICD-10-CM | POA: Diagnosis not present

## 2023-10-18 DIAGNOSIS — E663 Overweight: Secondary | ICD-10-CM

## 2023-10-18 DIAGNOSIS — E782 Mixed hyperlipidemia: Secondary | ICD-10-CM

## 2023-10-18 MED ORDER — WEGOVY 1 MG/0.5ML ~~LOC~~ SOAJ
1.0000 mg | SUBCUTANEOUS | 2 refills | Status: DC
Start: 2023-10-18 — End: 2024-01-11

## 2023-10-18 NOTE — Patient Instructions (Addendum)
 Thank you for coming to the office today.  Consider Prevnar-20 pneumonia vaccine.  Refill renewal Wegovy  1mg  weekly  Please schedule a Follow-up Appointment to: Return in about 3 months (around 01/18/2024) for 3 month follow-up Weight Check.  If you have any other questions or concerns, please feel free to call the office or send a message through MyChart. You may also schedule an earlier appointment if necessary.  Additionally, you may be receiving a survey about your experience at our office within a few days to 1 week by e-mail or mail. We value your feedback.  Marsa Officer, DO [facility], [place]

## 2023-10-18 NOTE — Progress Notes (Signed)
 Subjective:    Patient ID: Leonora Gores, female    DOB: 10/23/1960, 63 y.o.   MRN: [id number]  Glenisha Mahogany Torrance is a 63 y.o. female presenting on 10/18/2023 for Medical Management of Chronic Issues   HPI  Discussed the use of AI scribe software for clinical note transcription with the patient, who gave verbal consent to proceed.  History of Present Illness   Leshonda Maile Linford is a 63 year old female who presents for a three-month follow-up regarding her weight management with Wegovy .  Weight management and appetite control - Current weight is 140 pounds on clinic scale, 135 pounds on home scale - Down 7 lbs in 3 months - Initiated Wegovy  in November 01/2023 currently on maintenance dose of 1 mg weekly - Wegovy  provides effective appetite suppression and helps avoid overeating - Monitors weight frequently - Expresses concern about potential weight regain if Wegovy  is discontinued, referencing literature on post-discontinuation weight gain  Gastrointestinal side effects - Experiences occasional 'sulfur burps' and sporadic diarrhea - Side effects are intermittent and do not occur with every dose - Last episode of gastrointestinal side effects occurred after dose on Friday     Pre Diabetes She has a history of prediabetes, currently well-controlled with Wegovy  1 mg. Recent blood work shows an A1C of 5.6, indicating she is no longer in the prediabetic range.      10/18/2023    1:51 PM 07/19/2023    2:05 PM 04/24/2023    3:20 PM  Depression screen PHQ 2/9  Decreased Interest 0 1 1  Down, Depressed, Hopeless 0 0 0  PHQ - 2 Score 0 1 1  Altered sleeping 0 1 1  Tired, decreased energy 0 1 2  Change in appetite 0 1 0  Feeling bad or failure about yourself  0 0 0  Trouble concentrating 0 2 0  Moving slowly or fidgety/restless 0 0 0  Suicidal thoughts 0 0 0  PHQ-9 Score 0 6 4  Difficult doing work/chores  Not difficult at all Somewhat difficult       10/18/2023    1:51 PM  07/19/2023    2:06 PM 04/24/2023    3:20 PM 01/13/2023    1:09 PM  GAD 7 : Generalized Anxiety Score  Nervous, Anxious, on Edge 0 0 0 0  Control/stop worrying 0 0 0 0  Worry too much - different things 0 0 0 0  Trouble relaxing 0 0 0 0  Restless 0 0 0 0  Easily annoyed or irritable 0 0 0 0  Afraid - awful might happen 0 0 0 0  Total GAD 7 Score 0 0 0 0  Anxiety Difficulty  Not difficult at all      Social History   Tobacco Use   Smoking status: Never   Smokeless tobacco: Never  Vaping Use   Vaping status: Never Used  Substance Use Topics   Alcohol use: Not Currently    Alcohol/week: 1.0 - 2.0 standard drink of alcohol    Types: 1 - 2 Glasses of wine per week    Comment: monthly   Drug use: No    Review of Systems Per HPI unless specifically indicated above     Objective:    BP 120/72 (BP Location: Right Arm, Patient Position: Sitting, Cuff Size: Normal)   Pulse 68   Ht 5' 2 (1.575 m)   Wt 140 lb 4 oz (63.6 kg)   SpO2 99%   BMI 25.65 kg/m  Wt Readings from Last 3 Encounters:  10/18/23 140 lb 4 oz (63.6 kg)  09/19/23 147 lb 9.6 oz (67 kg)  07/19/23 147 lb 9.6 oz (67 kg)    Physical Exam Vitals and nursing note reviewed.  Constitutional:      General: She is not in acute distress.    Appearance: Normal appearance. She is well-developed. She is not diaphoretic.     Comments: Well-appearing, comfortable, cooperative  HENT:     Head: Normocephalic and atraumatic.  Eyes:     General:        Right eye: No discharge.        Left eye: No discharge.     Conjunctiva/sclera: Conjunctivae normal.  Cardiovascular:     Rate and Rhythm: Normal rate.  Pulmonary:     Effort: Pulmonary effort is normal.  Skin:    General: Skin is warm and dry.     Findings: No erythema or rash.  Neurological:     Mental Status: She is alert and oriented to person, place, and time.  Psychiatric:        Mood and Affect: Mood normal.        Behavior: Behavior normal.        Thought  Content: Thought content normal.     Comments: Well groomed, good eye contact, normal speech and thoughts     Results for orders placed or performed in visit on 07/12/23  T4, free   Collection Time: 07/12/23  8:51 AM  Result Value Ref Range   Free T4 1.0 0.8 - 1.8 ng/dL  TSH   Collection Time: 07/12/23  8:51 AM  Result Value Ref Range   TSH 1.63 0.40 - 4.50 mIU/L  CBC with Differential/Platelet   Collection Time: 07/12/23  8:51 AM  Result Value Ref Range   WBC 4.4 3.8 - 10.8 Thousand/uL   RBC 4.62 3.80 - 5.10 Million/uL   Hemoglobin 13.8 11.7 - 15.5 g/dL   HCT 57.8 64.9 - 54.9 %   MCV 91.1 80.0 - 100.0 fL   MCH 29.9 27.0 - 33.0 pg   MCHC 32.8 32.0 - 36.0 g/dL   RDW 87.9 88.9 - 84.9 %   Platelets 212 140 - 400 Thousand/uL   MPV 10.9 7.5 - 12.5 fL   Neutro Abs 2,358 1,500 - 7,800 cells/uL   Absolute Lymphocytes 1,580 850 - 3,900 cells/uL   Absolute Monocytes 352 200 - 950 cells/uL   Eosinophils Absolute 70 15 - 500 cells/uL   Basophils Absolute 40 0 - 200 cells/uL   Neutrophils Relative % 53.6 %   Total Lymphocyte 35.9 %   Monocytes Relative 8.0 %   Eosinophils Relative 1.6 %   Basophils Relative 0.9 %  COMPLETE METABOLIC PANEL WITH GFR   Collection Time: 07/12/23  8:51 AM  Result Value Ref Range   Glucose, Bld 95 65 - 99 mg/dL   BUN 11 7 - 25 mg/dL   Creat 9.33 9.49 - 8.94 mg/dL   BUN/Creatinine Ratio SEE NOTE: 6 - 22 (calc)   Sodium 140 135 - 146 mmol/L   Potassium 4.5 3.5 - 5.3 mmol/L   Chloride 101 98 - 110 mmol/L   CO2 30 20 - 32 mmol/L   Calcium  8.9 8.6 - 10.4 mg/dL   Total Protein 6.5 6.1 - 8.1 g/dL   Albumin 4.3 3.6 - 5.1 g/dL   Globulin 2.2 1.9 - 3.7 g/dL (calc)   AG Ratio 2.0 1.0 - 2.5 (calc)   Total Bilirubin  0.6 0.2 - 1.2 mg/dL   Alkaline phosphatase (APISO) 49 37 - 153 U/L   AST 19 10 - 35 U/L   ALT 23 6 - 29 U/L  Lipid panel   Collection Time: 07/12/23  8:51 AM  Result Value Ref Range   Cholesterol 166 <200 mg/dL   HDL 68 > OR = 50 mg/dL    Triglycerides 876 <849 mg/dL   LDL Cholesterol (Calc) 78 mg/dL (calc)   Total CHOL/HDL Ratio 2.4 <5.0 (calc)   Non-HDL Cholesterol (Calc) 98 <869 mg/dL (calc)  Hemoglobin J8r   Collection Time: 07/12/23  8:51 AM  Result Value Ref Range   Hgb A1c MFr Bld 5.6 <5.7 %   Mean Plasma Glucose 114 mg/dL   eAG (mmol/L) 6.3 mmol/L      Assessment & Plan:   Problem List Items Addressed This Visit     Mixed hyperlipidemia (Chronic)   Relevant Medications   WEGOVY  1 MG/0.5ML SOAJ   Overweight (BMI 25.0-29.9)   Relevant Medications   WEGOVY  1 MG/0.5ML SOAJ   Pre-diabetes   Relevant Medications   WEGOVY  1 MG/0.5ML SOAJ    Overweight BMI  History Obesity Weight down 7 lbs from 147 to 140 in 3 months On Wegovy  1 mg for weight management with mild side effects. Discussed potential weight regain if discontinued. Prefers Wegovy  if effective and covered by insurance. - Continue Wegovy  1 mg weekly. For maintenance therapy - Schedule follow-up every three months to monitor progress and insurance coverage. - Consider Contrave if Wegovy  is discontinued or ineffective.  General Health Maintenance Due for pneumonia vaccination per updated age recommendations. - Administer Prevnar 20 pneumonia vaccine.        No orders of the defined types were placed in this encounter.   Meds ordered this encounter  Medications   WEGOVY  1 MG/0.5ML SOAJ    Sig: Inject 1 mg into the skin once a week.    Dispense:  2 mL    Refill:  2    Renewal maintenance dose 1mg     Follow up plan: Return in about 3 months (around 01/18/2024) for 3 month follow-up Weight Check.  Marsa Officer, DO [facility] 10/18/2023, 1:57 PM

## 2023-10-19 ENCOUNTER — Other Ambulatory Visit: Payer: Self-pay | Admitting: Medical

## 2023-10-26 ENCOUNTER — Ambulatory Visit: Attending: Medical | Admitting: Medical

## 2023-10-26 ENCOUNTER — Encounter: Payer: Self-pay | Admitting: Medical

## 2023-10-26 VITALS — BP 114/68 | HR 72 | Ht 62.0 in | Wt 137.4 lb

## 2023-10-26 DIAGNOSIS — I700 Atherosclerosis of aorta: Secondary | ICD-10-CM

## 2023-10-26 DIAGNOSIS — G4733 Obstructive sleep apnea (adult) (pediatric): Secondary | ICD-10-CM

## 2023-10-26 DIAGNOSIS — E782 Mixed hyperlipidemia: Secondary | ICD-10-CM

## 2023-10-26 DIAGNOSIS — Z8249 Family history of ischemic heart disease and other diseases of the circulatory system: Secondary | ICD-10-CM | POA: Diagnosis not present

## 2023-10-26 MED ORDER — EZETIMIBE 10 MG PO TABS: 10.0000 mg | ORAL_TABLET | Freq: Every day | ORAL | 3 refills | Status: AC

## 2023-10-26 MED ORDER — PITAVASTATIN CALCIUM 4 MG PO TABS: 1.0000 | ORAL_TABLET | Freq: Every day | ORAL | 3 refills | Status: AC

## 2023-10-26 NOTE — Patient Instructions (Signed)
 Medication Instructions:  Your physician recommends that you continue on your current medications as directed. Please refer to the Current Medication list given to you today.    *If you need a refill on your cardiac medications before your next appointment, please call your pharmacy*  Lab Work: No labs ordered today    Testing/Procedures: No test ordered today   Follow-Up: At [facility], you and your health needs are our priority.  As part of our continuing mission to provide you with exceptional heart care, our providers are all part of one team.  This team includes your primary Cardiologist (physician) and Advanced Practice Providers or APPs (Physician Assistants and Nurse Practitioners) who all work together to provide you with the care you need, when you need it.  Your next appointment:   1 year(s)  Provider:   Alm Clay, MD or Cadence Franchester, PA-C

## 2023-10-26 NOTE — Progress Notes (Signed)
 Cardiology Office Note   Date:  10/26/2023  ID:  Christine Berger, DOB 16-Nov-1960, MRN [id number] PCP: Edman Marsa PARAS, DO  [facility] Providers Cardiologist:  Alm Clay, MD   History of Present Illness Christine Berger is a 63 y.o. female with a hx of hyperlipidemia, thoracic aortic atherosclerosis, allergies, family history of premature CAD, OSA on CPA who presents for 1 year follow-up.   Cardiac scoring in 2022 showed coronary calcium  score of 0.  Aortic atherosclerosis noted on imaging study.  Echo at that time showed EF of 55 to 60%, grade 1 diastolic dysfunction, trivial MR.   Patient was last seen July 2024 and was overall doing well from a cardiac perspective.  Today, the patient is overall doing well. She has lost about 40lbs on Wegovy . She denies chest pain and SOB. She is not doing any formal activity. She is still working as a Runner, broadcasting/film/video. Denies lower leg edema,palpitations, heart racing. She quit using her CPAP since lowing weight.   Studies Reviewed EKG Interpretation Date/Time:  Thursday October 26 2023 08:59:31 EDT Ventricular Rate:  72 PR Interval:  142 QRS Duration:  86 QT Interval:  384 QTC Calculation: 420 R Axis:   33  Text Interpretation: Normal sinus rhythm Normal ECG When compared with ECG of 07-Oct-2022 07:58, No significant change was found Confirmed by Franchester, Zakayla Martinec ([id number]) on 10/26/2023 9:02:21 AM    Echo 05/2020  1. Left ventricular ejection fraction, by estimation, is 55 to 60%. The  left ventricle has normal function. The left ventricle has no regional  wall motion abnormalities. Left ventricular diastolic parameters are  consistent with Grade I diastolic  dysfunction (impaired relaxation).   2. Right ventricular systolic function is normal. The right ventricular  size is normal.   3. Left atrial size was mildly dilated.   4. The mitral valve is normal in structure. Trivial mitral valve  regurgitation.   5. The aortic valve is  normal in structure. Aortic valve regurgitation is  not visualized.   6. The inferior vena cava is normal in size with greater than 50%  respiratory variability, suggesting right atrial pressure of 3 mmHg.      Cardiac Score 05/2020 FINDINGS: Non-cardiac: See separate report from [facility].   Ascending Aorta: Normal size   Pericardium: Normal   Coronary arteries: Calcium  score 0   IMPRESSION: Coronary calcium  score of 0.       Physical Exam VS:  BP 114/68 (BP Location: Left Arm, Patient Position: Sitting, Cuff Size: Normal)   Pulse 72   Ht 5' 2 (1.575 m)   Wt 137 lb 6.4 oz (62.3 kg)   SpO2 92%   BMI 25.13 kg/m        Wt Readings from Last 3 Encounters:  10/26/23 137 lb 6.4 oz (62.3 kg)  10/18/23 140 lb 4 oz (63.6 kg)  09/19/23 147 lb 9.6 oz (67 kg)    GEN: Well nourished, well developed in no acute distress NECK: No JVD; No carotid bruits CARDIAC: RRR, no murmurs, rubs, gallops RESPIRATORY:  Clear to auscultation without rales, wheezing or rhonchi  ABDOMEN: Soft, non-tender, non-distended EXTREMITIES:  No edema; No deformity   ASSESSMENT AND PLAN  Mixed HLD LDL 78, TC 166, HDL 68, TG 123. Refill Zetia  and statin.   Thoracic aortic atherosclerosis Family history of premature CAD In 2022 coronary calcium  score of 0. She denies anginal symptoms. Recommended activity/walking 3 times weekly. Continue cholesterol therapy.   OSA She is off  CPAP after weight loss  Obesity She lost 40lbs on Wegovy .        Dispo: Follow-up in 1 year  Signed, Hilman Kissling VEAR Fishman, PA-C

## 2023-11-01 ENCOUNTER — Telehealth: Payer: Self-pay

## 2023-11-01 NOTE — Telephone Encounter (Signed)
 LVM orthotics are here pt can pu no appt needed  Balance: $271.00

## 2023-11-26 ENCOUNTER — Ambulatory Visit
Admission: EM | Admit: 2023-11-26 | Discharge: 2023-11-26 | Disposition: A | Discharge status: Home / Self Care | Attending: Physician Assistant | Admitting: Physician Assistant

## 2023-11-26 ENCOUNTER — Encounter: Payer: Self-pay | Admitting: Emergency Medicine

## 2023-11-26 ENCOUNTER — Encounter: Payer: Self-pay | Admitting: Podiatry

## 2023-11-26 DIAGNOSIS — M797 Fibromyalgia: Secondary | ICD-10-CM | POA: Insufficient documentation

## 2023-11-26 DIAGNOSIS — R52 Pain, unspecified: Secondary | ICD-10-CM | POA: Diagnosis present

## 2023-11-26 DIAGNOSIS — R5383 Other fatigue: Secondary | ICD-10-CM | POA: Diagnosis not present

## 2023-11-26 DIAGNOSIS — R519 Headache, unspecified: Secondary | ICD-10-CM | POA: Diagnosis not present

## 2023-11-26 LAB — CBC WITH DIFFERENTIAL/PLATELET
Abs Immature Granulocytes: 0.01 K/uL (ref 0.00–0.07)
Basophils Absolute: 0 K/uL (ref 0.0–0.1)
Basophils Relative: 1 %
Eosinophils Absolute: 0.1 K/uL (ref 0.0–0.5)
Eosinophils Relative: 3 %
HCT: 41.1 % (ref 36.0–46.0)
Hemoglobin: 14.1 g/dL (ref 12.0–15.0)
Immature Granulocytes: 0 %
Lymphocytes Relative: 33 %
Lymphs Abs: 1.7 K/uL (ref 0.7–4.0)
MCH: 30 pg (ref 26.0–34.0)
MCHC: 34.3 g/dL (ref 30.0–36.0)
MCV: 87.4 fL (ref 80.0–100.0)
Monocytes Absolute: 0.5 K/uL (ref 0.1–1.0)
Monocytes Relative: 10 %
Neutro Abs: 2.9 K/uL (ref 1.7–7.7)
Neutrophils Relative %: 53 %
Platelets: 211 K/uL (ref 150–400)
RBC: 4.7 MIL/uL (ref 3.87–5.11)
RDW: 11.9 % (ref 11.5–15.5)
WBC: 5.3 K/uL (ref 4.0–10.5)
nRBC: 0 % (ref 0.0–0.2)

## 2023-11-26 LAB — COMPREHENSIVE METABOLIC PANEL WITH GFR
ALT: 22 U/L (ref 0–44)
AST: 23 U/L (ref 15–41)
Albumin: 4.1 g/dL (ref 3.5–5.0)
Alkaline Phosphatase: 53 U/L (ref 38–126)
Anion gap: 9 (ref 5–15)
BUN: 10 mg/dL (ref 8–23)
CO2: 28 mmol/L (ref 22–32)
Calcium: 9 mg/dL (ref 8.9–10.3)
Chloride: 100 mmol/L (ref 98–111)
Creatinine, Ser: 0.69 mg/dL (ref 0.44–1.00)
GFR, Estimated: >60 mL/min (ref >60)
Glucose, Bld: 109 mg/dL — ABNORMAL HIGH (ref 70–99)
Potassium: 4.9 mmol/L (ref 3.5–5.1)
Sodium: 137 mmol/L (ref 135–145)
Total Bilirubin: 0.6 mg/dL (ref 0.0–1.2)
Total Protein: 7 g/dL (ref 6.5–8.1)

## 2023-11-26 LAB — URINALYSIS, W/ REFLEX TO CULTURE (INFECTION SUSPECTED)
Bilirubin Urine: NEGATIVE
Glucose, UA: NEGATIVE mg/dL
Hgb urine dipstick: NEGATIVE
Ketones, ur: NEGATIVE mg/dL
Leukocytes,Ua: NEGATIVE
Nitrite: NEGATIVE
Protein, ur: NEGATIVE mg/dL
RBC / HPF: NONE SEEN RBC/hpf (ref 0–5)
Specific Gravity, Urine: 1.015 (ref 1.005–1.030)
pH: 7 (ref 5.0–8.0)

## 2023-11-26 LAB — TSH: TSH: 2.484 u[IU]/mL (ref 0.350–4.500)

## 2023-11-26 LAB — SARS CORONAVIRUS 2 BY RT PCR: SARS Coronavirus 2 by RT PCR: NEGATIVE

## 2023-11-26 MED ORDER — CELECOXIB 100 MG PO CAPS: 100.0000 mg | ORAL_CAPSULE | Freq: Two times a day (BID) | ORAL | 0 refills | Status: DC | PRN

## 2023-11-26 NOTE — Discharge Instructions (Addendum)
-   Negative COVID test. - CBC, CMP are without any significant findings.  You are not anemic.  White blood cell counts not high indicating potential infection, kidney liver function look good and electrolytes are within normal range.  Normal urinalysis. - Pending thyroid  testing and Lyme disease titer. -Symptoms could be due to your fibromyalgia flaring up and lack of sleep.  Practice good sleep hygiene, increased rest and fluids, get exercise daily, try ibuprofen and Tylenol as needed for aches and pains. - Keep follow-up appointment next week.  Go to emergency department sooner for any acute worsening of symptoms.

## 2023-11-26 NOTE — ED Triage Notes (Signed)
 Patient c/o bodyaches, headache, and fatigue that 2 weeks ago.  Patient denies fevers.  Patient denies any other cold symptoms.

## 2023-11-26 NOTE — ED Provider Notes (Signed)
 [facility]    CSN: [id number] Arrival date & time: 11/26/23  9165      History   Chief Complaint Chief Complaint  Patient presents with   Headache   Generalized Body Aches    HPI Christine Berger is a 63 y.o. female with history of anxiety, asthma, allergies, hyperlipidemia, depression, sleep apnea, thyroid  disease, fibromyalgia, osteoporosis, anemia, and hypertension.  She presents today for generalized bodyaches, headaches and fatigue for the past couple weeks.  Patient denies any recent medication changes.  She denies fever, cough, congestion, sore throat, sinus pain, ear pain, chest pain, wheezing, shortness of breath, palpitations, leg swelling, abdominal pain, nausea/vomiting or constipation.  She reports a lot of pain throughout her entire back consistent with fibromyalgia discomfort.  States she does not get a lot of sleep at night because she has a lot of pain.  She takes Cymbalta  and a number of other medications for fibromyalgia.  Patient has PCP follow-up in 9 days for the symptoms but wanted to be seen a little sooner because she is just not feeling well.  No recent worsening of symptoms.  She says her husband told her she started to cough a little today.  HPI  Past Medical History:  Diagnosis Date   Allergy    Anemia 2008   Anxiety    Arthritis 1982   Asthma    Cataract    Depression    Family history of premature CAD 06/24/2021   Coronary Calcium  Score 05/26/2020: Calcium  score 0.  Aortic atherosclerosis noted.   Fibromyalgia    GERD (gastroesophageal reflux disease) 1985   Glaucoma    Hyperlipidemia    Hypertension 2021   Neuropathy    folloewd pcp   Osteoporosis    Sleep apnea    On CPAP   Thyroid  disease     Patient Active Problem List   Diagnosis Date Noted   OAB (overactive bladder) 04/06/2022   Overweight (BMI 25.0-29.9) 04/06/2022   Pre-diabetes 07/20/2021   Thoracic aortic atherosclerosis (HCC) 07/17/2021   Acquired  hypothyroidism 07/12/2021   OSA on CPAP 07/12/2021   Family history of premature CAD 06/24/2021   Laryngopharyngeal reflux (LPR) 04/05/2021   Extrinsic asthma 12/04/2020   Seasonal allergies 12/04/2020   Major depressive disorder, recurrent episode, 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  No RWMA.  GR 1 DD with mild LA dilation.  Normal mitral and aortic valves.  Normal RV size and function.  Normal RVP and RAP.  (Normal)   uterine ablation  04/28/2010    OB History   No obstetric history on file.      Home Medications    Prior to Admission medications   Medication Sig Start Date End Date Taking? Authorizing Provider  celecoxib  (CELEBREX ) 100 MG capsule Take 1 capsule (100 mg total) by mouth 2 (two) times daily as needed for moderate pain (pain score 4-6). 11/26/23  Yes Arvis Huxley B, PA-C  albuterol  (VENTOLIN  HFA) 108 (90 Base) MCG/ACT inhaler INHALE 2 PUFFS INTO THE LUNGS EVERY 4 HOURS AS NEEDED FOR WHEEZING, SHORTNESS OF BREATH, OR COUGH 12/01/21   Karamalegos, Marsa PARAS, DO  buPROPion  (WELLBUTRIN  XL) 150 MG 24 hr tablet Take 1  tablet (150 mg total) by mouth daily. 07/19/23   Karamalegos, Marsa PARAS, DO  busPIRone  (BUSPAR ) 10 MG tablet Take 1 tablet (10 mg total) by mouth 2 (two) times daily as needed. 01/13/23   Karamalegos, Marsa PARAS, DO  conjugated estrogens (PREMARIN) vaginal cream Premarin 0.625 mg/gram vaginal cream  INSERT 0.5 G TWICE A WEEK BY VAGINAL ROUTE AS NEEDED FOR 30 DAYS.    [provider]  DULoxetine  (CYMBALTA ) 60 MG capsule Take 1 capsule (60 mg total) by mouth daily. 01/13/23   Karamalegos, Marsa PARAS, DO  eletriptan  (RELPAX ) 40 MG tablet Take 1 tablet (40 mg total) by mouth as needed for  migraine or headache (USE AS DIRECTED). May repeat in 2 hours if headache persists or recurs. 01/27/23   Karamalegos, Marsa PARAS, DO  estradiol (ESTRACE) 2 MG tablet Take 2 mg by mouth daily.  04/16/16   [provider]  ezetimibe  (ZETIA ) 10 MG tablet Take 1 tablet (10 mg total) by mouth daily. 10/26/23   Furth, Cadence H, PA-C  fluticasone (FLONASE) 50 MCG/ACT nasal spray Place 2 sprays into both nostrils daily as needed for allergies.    [provider]  levothyroxine  (SYNTHROID ) 75 MCG tablet Take 1 tablet by mouth once daily 08/01/23   Karamalegos, Marsa PARAS, DO  methocarbamol (ROBAXIN) 500 MG tablet Take 500 mg by mouth 3 times/day as needed-between meals & bedtime for muscle spasms. 03/14/20   [provider]  metroNIDAZOLE (METROCREAM) 0.75 % cream Apply 1 application topically daily. 01/26/21   [provider]  mirabegron  ER (MYRBETRIQ ) 50 MG TB24 tablet Take 1 tablet (50 mg total) by mouth daily. 06/16/23   Stoioff, Glendia BROCKS, MD  montelukast  (SINGULAIR ) 10 MG tablet Take 10 mg by mouth daily as needed.    [provider]  omeprazole  (PRILOSEC) 40 MG capsule Take 40 mg by mouth daily as needed.    [provider]  ondansetron  (ZOFRAN -ODT) 4 MG disintegrating tablet Take 1 tablet (4 mg total) by mouth every 8 (eight) hours as needed for nausea or vomiting. 02/14/23   Karamalegos, Marsa PARAS, DO  Pitavastatin  Calcium  4 MG TABS Take 1 tablet (4 mg total) by mouth daily. 10/26/23   Furth, Cadence H, PA-C  progesterone (PROMETRIUM) 100 MG capsule Take 100 mg by mouth at bedtime. 08/02/22   [provider]  solifenacin  (VESICARE ) 5 MG tablet Take 1 tablet (5 mg total) by mouth daily. 09/14/23   Stoioff, Glendia BROCKS, MD  WEGOVY  1 MG/0.5ML SOAJ Inject 1 mg into the skin once a week. 10/18/23   Karamalegos, Marsa PARAS, DO    Family History Family History  Problem Relation Age of Onset   Stroke Mother    Diabetes Mother    Cancer Mother     Anuerysm Mother    Depression Mother    Arthritis Mother    Heart attack Father    Hearing loss Father    Heart disease Father    Hypertension Father    Arthritis Sister    Crohn's disease Sister    Birth defects Sister    Depression Sister    Hypertension Sister    Hyperlipidemia Paternal Actor    ADD / ADHD Son    Asthma Son    Learning disabilities Son    ADD / ADHD Daughter    Anxiety disorder Daughter    Asthma Daughter    Depression Daughter    Learning disabilities Daughter    Hyperlipidemia Paternal Aunt  Breast cancer Neg Hx     Social History Social History   Tobacco Use   Smoking status: Never   Smokeless tobacco: Never  Vaping Use   Vaping status: Never Used  Substance Use Topics   Alcohol use: Not Currently    Alcohol/week: 1.0 - 2.0 standard drink of alcohol    Types: 1 - 2 Glasses of wine per week    Comment: monthly   Drug use: No     Allergies   Aspartame, Penicillins, Erythromycin, Milk protein, and Statins   Review of Systems Review of Systems  Constitutional:  Positive for fatigue. Negative for chills, diaphoresis and fever.  HENT:  Negative for congestion, ear pain, rhinorrhea, sinus pain and sore throat.   Respiratory:  Negative for cough and shortness of breath.   Cardiovascular:  Negative for chest pain, palpitations and leg swelling.  Gastrointestinal:  Negative for abdominal pain, diarrhea, nausea and vomiting.  Genitourinary:  Positive for flank pain. Negative for difficulty urinating, dysuria and frequency.  Musculoskeletal:  Positive for back pain and myalgias.  Skin:  Negative for rash.  Neurological:  Positive for headaches. Negative for dizziness and weakness.  Hematological:  Negative for adenopathy.     Physical Exam Triage Vital Signs ED Triage Vitals  Encounter Vitals Group     BP 11/26/23 0846 136/77     Girls Systolic BP Percentile --      Girls Diastolic BP Percentile --      Boys Systolic BP Percentile  --      Boys Diastolic BP Percentile --      Pulse Rate 11/26/23 0846 62     Resp 11/26/23 0846 14     Temp 11/26/23 0846 97.9 F (36.6 C)     Temp Source 11/26/23 0846 Oral     SpO2 11/26/23 0846 98 %     Weight 11/26/23 0845 137 lb 5.6 oz (62.3 kg)     Height 11/26/23 0845 5' 2 (1.575 m)     Head Circumference --      Peak Flow --      Pain Score 11/26/23 0844 8     Pain Loc --      Pain Education --      Exclude from Growth Chart --    No data found.  Updated Vital Signs BP 136/77 (BP Location: Left Arm)   Pulse 62   Temp 97.9 F (36.6 C) (Oral)   Resp 14   Ht 5' 2 (1.575 m)   Wt 137 lb 5.6 oz (62.3 kg)   SpO2 98%   BMI 25.12 kg/m      Physical Exam Vitals and nursing note reviewed.  Constitutional:      General: She is not in acute distress.    Appearance: Normal appearance. She is not ill-appearing or toxic-appearing.  HENT:     Head: Normocephalic and atraumatic.     Nose: Nose normal.     Mouth/Throat:     Mouth: Mucous membranes are moist.     Pharynx: Oropharynx is clear.  Eyes:     General: No scleral icterus.       Right eye: No discharge.        Left eye: No discharge.     Conjunctiva/sclera: Conjunctivae normal.  Cardiovascular:     Rate and Rhythm: Normal rate and regular rhythm.     Heart sounds: Normal heart sounds.  Pulmonary:     Effort: Pulmonary effort is normal. No respiratory distress.  Breath sounds: Normal breath sounds.  Abdominal:     Palpations: Abdomen is soft.     Tenderness: There is no abdominal tenderness.  Musculoskeletal:     Cervical back: Neck supple.     Comments: Tenderness throughout bilateral upper and lower back  Skin:    General: Skin is dry.  Neurological:     General: No focal deficit present.     Mental Status: She is alert. Mental status is at baseline.     Motor: No weakness.     Gait: Gait normal.  Psychiatric:        Mood and Affect: Mood normal.        Behavior: Behavior normal.      UC  Treatments / Results  Labs (all labs ordered are listed, but only abnormal results are displayed) Labs Reviewed  URINALYSIS, W/ REFLEX TO CULTURE (INFECTION SUSPECTED) - Abnormal; Notable for the following components:      Result Value   Bacteria, UA FEW (*)    All other components within normal limits  COMPREHENSIVE METABOLIC PANEL WITH GFR - Abnormal; Notable for the following components:   Glucose, Bld 109 (*)    All other components within normal limits  SARS CORONAVIRUS 2 BY RT PCR  CBC WITH DIFFERENTIAL/PLATELET  TSH  LYME DISEASE SEROLOGY W/REFLEX    EKG   Radiology No results found.  Procedures Procedures (including critical care time)  Medications Ordered in UC Medications - No data to display  Initial Impression / Assessment and Plan / UC Course  I have reviewed the triage vital signs and the nursing notes.  Pertinent labs & imaging results that were available during my care of the patient were reviewed by me and considered in my medical decision making (see chart for details).   63 year old female with history of fibromyalgia, anxiety/depression, hypertension, hyperlipidemia, osteoporosis, allergies, sleep apnea, asthma presents for 2-week history of generalized fatigue, headaches and bodyaches.  Denies any cold symptoms.  Her husband told her she was coughing this morning.  Denies any rashes or recent tick bites.  Vitals are all stable and normal and she is overall well-appearing.  No acute distress.  Exam normal other than tenderness throughout upper and lower back.  Advised patient we could start workup for her generalized symptoms but they are likely due to fibromyalgia.  She is agreeable to this.  CBC, CMP, urinalysis, TSH, Lyme titer and COVID test obtained.  CBC, CMP, UA and COVID test without any significant findings.  Pending TSH and Lyme titer.  Advised patient we will call her with any abnormal results.  At this time we will treat fibromyalgia flareup  with celecoxib  and continue Cymbalta , rest and fluids, exercise, heating pad, topical muscle rubs and stretching.  Advised to keep PCP follow-up for 9/9 or return or go to ER sooner for any acute worsening of symptoms.  Patient agreeable to plan.   Final Clinical Impressions(s) / UC Diagnoses   Final diagnoses:  Other fatigue  Generalized body aches  Acute nonintractable headache, unspecified headache type  Fibromyalgia     Discharge Instructions      - Negative COVID test. - CBC, CMP are without any significant findings.  You are not anemic.  White blood cell counts not high indicating potential infection, kidney liver function look good and electrolytes are within normal range.  Normal urinalysis. - Pending thyroid  testing and Lyme disease titer. -Symptoms could be due to your fibromyalgia flaring up and lack of sleep.  Practice good sleep hygiene, increased rest and fluids, get exercise daily, try ibuprofen and Tylenol as needed for aches and pains. - Keep follow-up appointment next week.  Go to emergency department sooner for any acute worsening of symptoms.     ED Prescriptions     Medication Sig Dispense Auth. Provider   celecoxib  (CELEBREX ) 100 MG capsule Take 1 capsule (100 mg total) by mouth 2 (two) times daily as needed for moderate pain (pain score 4-6). 60 capsule Arvis Jolan NOVAK, PA-C      PDMP not reviewed this encounter.   Arvis Jolan NOVAK, PA-C 11/26/23 [phone]

## 2023-11-28 ENCOUNTER — Ambulatory Visit (HOSPITAL_COMMUNITY): Payer: Self-pay

## 2023-11-29 LAB — LYME DISEASE SEROLOGY W/REFLEX: Lyme Total Antibody EIA: NEGATIVE

## 2023-12-01 ENCOUNTER — Ambulatory Visit: Admitting: Family Medicine

## 2023-12-05 ENCOUNTER — Encounter: Payer: Self-pay | Admitting: Family Medicine

## 2023-12-05 ENCOUNTER — Ambulatory Visit: Admitting: Family Medicine

## 2023-12-05 VITALS — BP 120/70 | HR 68 | Ht 62.0 in | Wt 138.0 lb

## 2023-12-05 DIAGNOSIS — M2550 Pain in unspecified joint: Secondary | ICD-10-CM | POA: Diagnosis not present

## 2023-12-05 DIAGNOSIS — M797 Fibromyalgia: Secondary | ICD-10-CM

## 2023-12-05 DIAGNOSIS — Z23 Encounter for immunization: Secondary | ICD-10-CM

## 2023-12-05 DIAGNOSIS — G8929 Other chronic pain: Secondary | ICD-10-CM

## 2023-12-05 DIAGNOSIS — M1990 Unspecified osteoarthritis, unspecified site: Secondary | ICD-10-CM | POA: Diagnosis not present

## 2023-12-05 NOTE — Patient Instructions (Addendum)
 Thank you for coming to the office today.  Contact me when ready for refills on Celebrex .  Please schedule a Follow-up Appointment to: Return if symptoms worsen or fail to improve.  If you have any other questions or concerns, please feel free to call the office or send a message through MyChart. You may also schedule an earlier appointment if necessary.  Additionally, you may be receiving a survey about your experience at our office within a few days to 1 week by e-mail or mail. We value your feedback.  Marsa Officer, DO [facility], [place]

## 2023-12-05 NOTE — Progress Notes (Signed)
 Subjective:    Patient ID: Christine Berger, female    DOB: 21-Oct-1960, 63 y.o.   MRN: [id number]  Christine Berger is a 63 y.o. female presenting on 12/05/2023 for Headache   HPI  Discussed the use of AI scribe software for clinical note transcription with the patient, who gave verbal consent to proceed.  History of Present Illness   Christine Berger is a 63 year old female with fibromyalgia who presents with headaches and body pain.  Acute on Chronic Fibromyalgia / Joint Pain / Headaches - Onset approximately three weeks prior to urgent care visit on August 31st, 2025 ([facility]) - Symptoms described as similar to the flu, with sensation of body being affected by joint and body pain - No other infectious or respiratory or fever or other systemic symptoms - Headaches and body pain are diffuse, with no lateralization - Pain severity sufficient to interfere with sleep; movement during sleep causes pain that wakes her up - Significant relief of symptoms with Celebrex  prescribed at urgent care  Exacerbation of left shoulder bursitis, acute on chronic problem - Significant worsening of pre-existing left shoulder bursitis during current illness - Pain improved with Celebrex   Fibromyalgia and chronic pain management - History of fibromyalgia - On duloxetine  (Cymbalta ) for approximately twelve years with perceived benefit - Uses methocarbamol (Robaxin) as needed for muscle spasms, particularly in the back  Recent laboratory and diagnostic evaluation - Broad blood panel at urgent care showed no significant abnormalities - Negative COVID-19 and Lyme disease tests - Normal urinalysis, chemistry, and blood counts      Noted very strong family history. Mother diagnosed with psoriatic arthritis, sister has RA + Crohn's and niece same and grandmother mother side      12/05/2023    3:38 PM 10/18/2023    1:51 PM 07/19/2023    2:05 PM  Depression screen PHQ 2/9  Decreased Interest 0  0 1  Down, Depressed, Hopeless 0 0 0  PHQ - 2 Score 0 0 1  Altered sleeping 0 0 1  Tired, decreased energy 1 0 1  Change in appetite 0 0 1  Feeling bad or failure about yourself  0 0 0  Trouble concentrating 0 0 2  Moving slowly or fidgety/restless 0 0 0  Suicidal thoughts 0 0 0  PHQ-9 Score 1 0 6  Difficult doing work/chores   Not difficult at all       12/05/2023    3:38 PM 10/18/2023    1:51 PM 07/19/2023    2:06 PM 04/24/2023    3:20 PM  GAD 7 : Generalized Anxiety Score  Nervous, Anxious, on Edge 0 0 0 0  Control/stop worrying 0 0 0 0  Worry too much - different things 0 0 0 0  Trouble relaxing 0 0 0 0  Restless 0 0 0 0  Easily annoyed or irritable 0 0 0 0  Afraid - awful might happen 0 0 0 0  Total GAD 7 Score 0 0 0 0  Anxiety Difficulty Not difficult at all  Not difficult at all     Social History   Tobacco Use   Smoking status: Never   Smokeless tobacco: Never  Vaping Use   Vaping status: Never Used  Substance Use Topics   Alcohol use: Not Currently    Alcohol/week: 1.0 - 2.0 standard drink of alcohol    Types: 1 - 2 Glasses of wine per week    Comment: monthly  Drug use: No    Review of Systems Per HPI unless specifically indicated above     Objective:    BP 120/70 (BP Location: Left Arm, Patient Position: Sitting, Cuff Size: Normal)   Pulse 68   Ht 5' 2 (1.575 m)   Wt 138 lb (62.6 kg)   SpO2 99%   BMI 25.24 kg/m   Wt Readings from Last 3 Encounters:  12/05/23 138 lb (62.6 kg)  11/26/23 137 lb 5.6 oz (62.3 kg)  10/26/23 137 lb 6.4 oz (62.3 kg)    Physical Exam Vitals and nursing note reviewed.  Constitutional:      General: She is not in acute distress.    Appearance: Normal appearance. She is well-developed. She is not diaphoretic.     Comments: Well-appearing, comfortable, cooperative  HENT:     Head: Normocephalic and atraumatic.  Eyes:     General:        Right eye: No discharge.        Left eye: No discharge.      Conjunctiva/sclera: Conjunctivae normal.  Cardiovascular:     Rate and Rhythm: Normal rate.  Pulmonary:     Effort: Pulmonary effort is normal.  Skin:    General: Skin is warm and dry.     Findings: No erythema or rash.  Neurological:     Mental Status: She is alert and oriented to person, place, and time.  Psychiatric:        Mood and Affect: Mood normal.        Behavior: Behavior normal.        Thought Content: Thought content normal.     Comments: Well groomed, good eye contact, normal speech and thoughts     Results for orders placed or performed during the hospital encounter of 11/26/23  SARS Coronavirus 2 by RT PCR (hospital order, performed in [facility] hospital lab) *cepheid single result test* Anterior Nasal Swab   Collection Time: 11/26/23  9:05 AM   Specimen: Anterior Nasal Swab  Result Value Ref Range   SARS Coronavirus 2 by RT PCR NEGATIVE NEGATIVE  Urinalysis, w/ Reflex to Culture (Infection Suspected) -Urine, Clean Catch   Collection Time: 11/26/23  9:05 AM  Result Value Ref Range   Specimen Source URINE, CLEAN CATCH    Color, Urine YELLOW YELLOW   APPearance CLEAR CLEAR   Specific Gravity, Urine 1.015 1.005 - 1.030   pH 7.0 5.0 - 8.0   Glucose, UA NEGATIVE NEGATIVE mg/dL   Hgb urine dipstick NEGATIVE NEGATIVE   Bilirubin Urine NEGATIVE NEGATIVE   Ketones, ur NEGATIVE NEGATIVE mg/dL   Protein, ur NEGATIVE NEGATIVE mg/dL   Nitrite NEGATIVE NEGATIVE   Leukocytes,Ua NEGATIVE NEGATIVE   Squamous Epithelial / HPF 21-50 0 - 5 /HPF   WBC, UA 0-5 0 - 5 WBC/hpf   RBC / HPF NONE SEEN 0 - 5 RBC/hpf   Bacteria, UA FEW (A) NONE SEEN  Comprehensive metabolic panel   Collection Time: 11/26/23  9:05 AM  Result Value Ref Range   Sodium 137 135 - 145 mmol/L   Potassium 4.9 3.5 - 5.1 mmol/L   Chloride 100 98 - 111 mmol/L   CO2 28 22 - 32 mmol/L   Glucose, Bld 109 (H) 70 - 99 mg/dL   BUN 10 8 - 23 mg/dL   Creatinine, Ser 9.30 0.44 - 1.00 mg/dL   Calcium  9.0 8.9 -  10.3 mg/dL   Total Protein 7.0 6.5 - 8.1 g/dL   Albumin  4.1 3.5 - 5.0 g/dL   AST 23 15 - 41 U/L   ALT 22 0 - 44 U/L   Alkaline Phosphatase 53 38 - 126 U/L   Total Bilirubin 0.6 0.0 - 1.2 mg/dL   GFR, Estimated >39 >39 mL/min   Anion gap 9 5 - 15  CBC with Differential   Collection Time: 11/26/23  9:05 AM  Result Value Ref Range   WBC 5.3 4.0 - 10.5 K/uL   RBC 4.70 3.87 - 5.11 MIL/uL   Hemoglobin 14.1 12.0 - 15.0 g/dL   HCT 58.8 63.9 - 53.9 %   MCV 87.4 80.0 - 100.0 fL   MCH 30.0 26.0 - 34.0 pg   MCHC 34.3 30.0 - 36.0 g/dL   RDW 88.0 88.4 - 84.4 %   Platelets 211 150 - 400 K/uL   nRBC 0.0 0.0 - 0.2 %   Neutrophils Relative % 53 %   Neutro Abs 2.9 1.7 - 7.7 K/uL   Lymphocytes Relative 33 %   Lymphs Abs 1.7 0.7 - 4.0 K/uL   Monocytes Relative 10 %   Monocytes Absolute 0.5 0.1 - 1.0 K/uL   Eosinophils Relative 3 %   Eosinophils Absolute 0.1 0.0 - 0.5 K/uL   Basophils Relative 1 %   Basophils Absolute 0.0 0.0 - 0.1 K/uL   Immature Granulocytes 0 %   Abs Immature Granulocytes 0.01 0.00 - 0.07 K/uL  TSH   Collection Time: 11/26/23  9:05 AM  Result Value Ref Range   TSH 2.484 0.350 - 4.500 uIU/mL  Lyme Disease Serology w/Reflex   Collection Time: 11/26/23  9:05 AM  Result Value Ref Range   Lyme Total Antibody EIA Negative Negative      Assessment & Plan:   Problem List Items Addressed This Visit     Fibromyalgia - Primary   Relevant Orders   Sed Rate (ESR)   C-reactive protein   Cyclic citrul peptide antibody, IgG   Rheumatoid Factor   ANA   Other Visit Diagnoses       Flu vaccine need       Relevant Orders   Flu vaccine trivalent PF, 6mos and older(Flulaval,Afluria,Fluarix,Fluzone) (Completed)     Chronic joint pain       Relevant Orders   Sed Rate (ESR)   C-reactive protein   Cyclic citrul peptide antibody, IgG   Rheumatoid Factor   ANA     Joint inflammation       Relevant Orders   Sed Rate (ESR)   C-reactive protein   Cyclic citrul peptide antibody,  IgG   Rheumatoid Factor   ANA        Fibromyalgia with chronic pain syndrome Chronic Headaches Chronic pain syndrome exacerbated with headaches and diffuse pain. Long history of fibromyalgia, now having recent flare up worsening. Recent Urgent Care visit. Celebrex  effective. Negative labs and COVID-19 test. Family history of inflammatory conditions. Consider inflammatory or autoimmune contributors. It has been years since prior evaluation on labs and no longer follows Rheumatology. Has not seen Rheum in [place]  - Continue Celebrex  100 mg twice daily for 2-4 weeks, then reassess. - Adjust Celebrex  dose based on symptom changes. Consider lower to 50mg  if preferred or we discussed goal of intermittent NSAID use, not long term daily use. - Plan blood work for inflammatory markers (ANA, ESR, CRP) before October appointment. - Consider rheumatology referral if symptoms persist or worsen. - Discuss neurology referral for persistent headaches.  Left shoulder bursitis Chronic bursitis with  recent flare-up. Celebrex  effective in symptom reduction. - Continue Celebrex .        Orders Placed This Encounter  Procedures   Flu vaccine trivalent PF, 6mos and older(Flulaval,Afluria,Fluarix,Fluzone)   Sed Rate (ESR)    Standing Status:   Future    Expected Date:   01/17/2024    Expiration Date:   04/16/2024   C-reactive protein    Standing Status:   Future    Expected Date:   01/17/2024    Expiration Date:   04/16/2024   Cyclic citrul peptide antibody, IgG    Standing Status:   Future    Expected Date:   01/17/2024    Expiration Date:   04/16/2024   Rheumatoid Factor    Standing Status:   Future    Expected Date:   01/17/2024    Expiration Date:   04/16/2024   ANA    Standing Status:   Future    Expected Date:   01/17/2024    Expiration Date:   04/16/2024    No orders of the defined types were placed in this encounter.   Follow up plan: Return if symptoms worsen or fail to  improve.  Future labs 01/17/24  Marsa Officer, DO [facility] 12/05/2023, 3:42 PM

## 2023-12-06 ENCOUNTER — Telehealth: Payer: Self-pay

## 2023-12-06 NOTE — Telephone Encounter (Signed)
 LVM- called pt back because she left a message asking where her orthotics are. They are here.. please check previous notes

## 2023-12-07 ENCOUNTER — Telehealth: Payer: Self-pay | Admitting: Pharmacy Technician

## 2023-12-07 NOTE — Telephone Encounter (Signed)
  Pharmacy Patient Advocate Encounter   Received notification from CoverMyMeds that prior authorization for pitavastatin  4mg  is required/requested.   Insurance verification completed.   The patient is insured through [facility] .   Per pa:   Lf 10/31/23 90ds

## 2023-12-09 ENCOUNTER — Other Ambulatory Visit: Payer: Self-pay | Admitting: Urology

## 2023-12-21 DIAGNOSIS — H2513 Age-related nuclear cataract, bilateral: Secondary | ICD-10-CM | POA: Diagnosis not present

## 2023-12-21 DIAGNOSIS — H40003 Preglaucoma, unspecified, bilateral: Secondary | ICD-10-CM | POA: Diagnosis not present

## 2023-12-28 DIAGNOSIS — Z1231 Encounter for screening mammogram for malignant neoplasm of breast: Secondary | ICD-10-CM | POA: Diagnosis not present

## 2023-12-28 DIAGNOSIS — Z01419 Encounter for gynecological examination (general) (routine) without abnormal findings: Secondary | ICD-10-CM | POA: Diagnosis not present

## 2023-12-28 DIAGNOSIS — Z13820 Encounter for screening for osteoporosis: Secondary | ICD-10-CM | POA: Diagnosis not present

## 2023-12-28 DIAGNOSIS — Z124 Encounter for screening for malignant neoplasm of cervix: Secondary | ICD-10-CM | POA: Diagnosis not present

## 2023-12-28 DIAGNOSIS — Z6824 Body mass index (BMI) 24.0-24.9, adult: Secondary | ICD-10-CM | POA: Diagnosis not present

## 2024-01-03 ENCOUNTER — Other Ambulatory Visit: Payer: Self-pay | Admitting: Obstetrics and Gynecology

## 2024-01-03 DIAGNOSIS — R928 Other abnormal and inconclusive findings on diagnostic imaging of breast: Secondary | ICD-10-CM

## 2024-01-11 ENCOUNTER — Other Ambulatory Visit: Payer: Self-pay | Admitting: Family Medicine

## 2024-01-11 ENCOUNTER — Encounter: Payer: Self-pay | Admitting: Family Medicine

## 2024-01-11 DIAGNOSIS — M797 Fibromyalgia: Secondary | ICD-10-CM

## 2024-01-11 DIAGNOSIS — E663 Overweight: Secondary | ICD-10-CM

## 2024-01-11 DIAGNOSIS — R7303 Prediabetes: Secondary | ICD-10-CM

## 2024-01-11 DIAGNOSIS — E782 Mixed hyperlipidemia: Secondary | ICD-10-CM

## 2024-01-11 MED ORDER — CELECOXIB 50 MG PO CAPS: 50.0000 mg | ORAL_CAPSULE | Freq: Two times a day (BID) | ORAL | 2 refills | Status: AC

## 2024-01-11 MED ORDER — WEGOVY 1 MG/0.5ML ~~LOC~~ SOAJ: 1.0000 mg | SUBCUTANEOUS | 2 refills | Status: DC

## 2024-01-17 ENCOUNTER — Other Ambulatory Visit

## 2024-01-17 DIAGNOSIS — M797 Fibromyalgia: Secondary | ICD-10-CM

## 2024-01-17 DIAGNOSIS — M2550 Pain in unspecified joint: Secondary | ICD-10-CM | POA: Diagnosis not present

## 2024-01-17 DIAGNOSIS — G8929 Other chronic pain: Secondary | ICD-10-CM | POA: Diagnosis not present

## 2024-01-17 DIAGNOSIS — M1990 Unspecified osteoarthritis, unspecified site: Secondary | ICD-10-CM

## 2024-01-20 LAB — CYCLIC CITRUL PEPTIDE ANTIBODY, IGG: Cyclic Citrullin Peptide Ab: <16 U

## 2024-01-20 LAB — SEDIMENTATION RATE: Sed Rate: 9 mm/h (ref 0–30)

## 2024-01-20 LAB — RHEUMATOID FACTOR: Rheumatoid fact SerPl-aCnc: <10 [IU]/mL (ref <14)

## 2024-01-20 LAB — C-REACTIVE PROTEIN: CRP: <3 mg/L (ref <8.0)

## 2024-01-20 LAB — ANA: Anti Nuclear Antibody (ANA): NEGATIVE

## 2024-01-22 ENCOUNTER — Encounter: Payer: Self-pay | Admitting: Obstetrics and Gynecology

## 2024-01-22 ENCOUNTER — Encounter

## 2024-01-23 ENCOUNTER — Ambulatory Visit: Admitting: Family Medicine

## 2024-01-23 ENCOUNTER — Encounter: Payer: Self-pay | Admitting: Family Medicine

## 2024-01-23 ENCOUNTER — Ambulatory Visit: Payer: Self-pay | Admitting: Family Medicine

## 2024-01-23 VITALS — BP 124/80 | HR 64 | Ht 62.0 in | Wt 136.0 lb

## 2024-01-23 DIAGNOSIS — M797 Fibromyalgia: Secondary | ICD-10-CM

## 2024-01-23 DIAGNOSIS — M2550 Pain in unspecified joint: Secondary | ICD-10-CM

## 2024-01-23 DIAGNOSIS — M7552 Bursitis of left shoulder: Secondary | ICD-10-CM | POA: Diagnosis not present

## 2024-01-23 DIAGNOSIS — E663 Overweight: Secondary | ICD-10-CM

## 2024-01-23 DIAGNOSIS — R7303 Prediabetes: Secondary | ICD-10-CM | POA: Diagnosis not present

## 2024-01-23 DIAGNOSIS — K121 Other forms of stomatitis: Secondary | ICD-10-CM

## 2024-01-23 DIAGNOSIS — G8929 Other chronic pain: Secondary | ICD-10-CM

## 2024-01-23 MED ORDER — LIDOCAINE HCL (PF) 1 % IJ SOLN
4.0000 mL | Freq: Once | INTRAMUSCULAR | Status: AC
  Administered 2024-01-23: 4 mL

## 2024-01-23 MED ORDER — METHYLPREDNISOLONE ACETATE 40 MG/ML IJ SUSP
40.0000 mg | Freq: Once | INTRAMUSCULAR | Status: AC
  Administered 2024-01-23: 40 mg via INTRA_ARTICULAR

## 2024-01-23 NOTE — Patient Instructions (Addendum)
 Thank you for coming to the office today.  Weight down to 136 lbs, gradual loss in 3 months 147 > 140 > 136  Continue Wegovy  1 mg weekly for maintenance, refills on file.  Future considerations in 2026, maybe transition to oral dose if interested.  Start Omeprazole  40mg  daily before breakfast daily for better stomach acid control, see if this reduces the ulcers in the mouth.  You received a Left Shoulder Joint steroid injection today. - Lidocaine numbing medicine may ease the pain initially for a few hours until it wears off - As discussed, you may experience a steroid flare this evening or within 24-48 hours, anytime medicine is injected into an inflamed joint it can cause the pain to get worse temporarily - Everyone responds differently to these injections, it depends on the patient and the severity of the joint problem, it may provide anywhere from days to weeks, to months of relief. Ideal response is >6 months relief - Try to take it easy for next 1-2 days, avoid over activity and strain on joint (limit lifting for shoulder) - Recommend the following:   - For swelling - rest, compression sleeve / ACE wrap, elevation, and ice packs as needed for first few days   - For pain in future may use heating pad or moist heat as needed   Please schedule a Follow-up Appointment to: Return in about 3 months (around 04/24/2024) for 3 month Weight Check, L Shoulder update.  If you have any other questions or concerns, please feel free to call the office or send a message through MyChart. You may also schedule an earlier appointment if necessary.  Additionally, you may be receiving a survey about your experience at our office within a few days to 1 week by e-mail or mail. We value your feedback.  Marsa Officer, DO [facility], [place]

## 2024-01-23 NOTE — Progress Notes (Addendum)
 Subjective:    Patient ID: Christine Berger, female    DOB: November 21, 1960, 63 y.o.   MRN: [id number]  Christine Berger is a 63 y.o. female presenting on 01/23/2024 for Medical Management of Chronic Issues   HPI  Discussed the use of AI scribe software for clinical note transcription with the patient, who gave verbal consent to proceed.  History of Present Illness   Christine Berger is a 63 year old female who presents for a weight check and follow-up on fibromyalgia and shoulder pain.  Fibromyalgia symptoms and management - Chronic fibromyalgia managed with duloxetine  (Cymbalta ) and muscle relaxants as needed. - High-impact exercise exacerbates symptoms; considering low-impact activities such as water aerobics or elliptical machine.  Left Shoulder Chronic Bursitis Shoulder pain - Onset for years now with gradual progress and some persistent left shoulder pain with nocturnal throbbing and difficulty performing movements like overhead or away from body - Right shoulder previously treated with joint injection for similar symptoms with improvement. - Celebrex  dosage adjusted to 100-50 mg twice daily due to gastrointestinal acidity, resulting in improved sleep. Taking Celebrex  50mg  TWICE A DAY it helps her rest and sleep more due to less pain  Oral ulcerations - Mouth sores present for the past six to seven months. - Suspects etiology related to gastric acid exposure rather than autoimmune disease. - Recent blood work within normal limits.  Goal to resume Omeprazole  daily for GERD     Weight management and appetite control - Current weight is 136 lbs on clinic scale - Down 4 lbs in 3 months - Initiated Wegovy  in November 01/2023 currently on maintenance dose of 1 mg weekly - Wegovy  provides effective appetite suppression and helps avoid overeating - Monitors weight frequently - See A&P for details on lifestyle management           12/05/2023    3:38 PM 10/18/2023    1:51 PM  07/19/2023    2:05 PM  Depression screen PHQ 2/9  Decreased Interest 0 0 1  Down, Depressed, Hopeless 0 0 0  PHQ - 2 Score 0 0 1  Altered sleeping 0 0 1  Tired, decreased energy 1 0 1  Change in appetite 0 0 1  Feeling bad or failure about yourself  0 0 0  Trouble concentrating 0 0 2  Moving slowly or fidgety/restless 0 0 0  Suicidal thoughts 0 0 0  PHQ-9 Score 1 0 6  Difficult doing work/chores   Not difficult at all       12/05/2023    3:38 PM 10/18/2023    1:51 PM 07/19/2023    2:06 PM 04/24/2023    3:20 PM  GAD 7 : Generalized Anxiety Score  Nervous, Anxious, on Edge 0 0 0 0  Control/stop worrying 0 0 0 0  Worry too much - different things 0 0 0 0  Trouble relaxing 0 0 0 0  Restless 0 0 0 0  Easily annoyed or irritable 0 0 0 0  Afraid - awful might happen 0 0 0 0  Total GAD 7 Score 0 0 0 0  Anxiety Difficulty Not difficult at all  Not difficult at all     Social History   Tobacco Use   Smoking status: Never   Smokeless tobacco: Never  Vaping Use   Vaping status: Never Used  Substance Use Topics   Alcohol use: Not Currently    Alcohol/week: 1.0 - 2.0 standard drink of alcohol  Types: 1 - 2 Glasses of wine per week    Comment: monthly   Drug use: No    Review of Systems Per HPI unless specifically indicated above     Objective:    BP 124/80 (BP Location: Right Arm, Patient Position: Sitting, Cuff Size: Normal)   Pulse 64   Ht 5' 2 (1.575 m)   Wt 136 lb (61.7 kg)   SpO2 95%   BMI 24.87 kg/m   Wt Readings from Last 3 Encounters:  01/23/24 136 lb (61.7 kg)  12/05/23 138 lb (62.6 kg)  10/18/23 140 lbs 4 oz     Physical Exam Vitals and nursing note reviewed.  Constitutional:      General: She is not in acute distress.    Appearance: Normal appearance. She is well-developed. She is not diaphoretic.     Comments: Well-appearing, comfortable, cooperative  HENT:     Head: Normocephalic and atraumatic.  Eyes:     General:        Right eye: No  discharge.        Left eye: No discharge.     Conjunctiva/sclera: Conjunctivae normal.  Cardiovascular:     Rate and Rhythm: Normal rate.  Pulmonary:     Effort: Pulmonary effort is normal.  Musculoskeletal:     Comments: Left Shoulder reduced range of motion above head motions flexion abduction, impingement testing positive.  Skin:    General: Skin is warm and dry.     Findings: No erythema or rash.  Neurological:     Mental Status: She is alert and oriented to person, place, and time.  Psychiatric:        Mood and Affect: Mood normal.        Behavior: Behavior normal.        Thought Content: Thought content normal.     Comments: Well groomed, good eye contact, normal speech and thoughts     ________________________________________________________ PROCEDURE NOTE Date: 01/23/24 Left Subacromial Shoulder injection Discussed benefits and risks (including pain, bleeding, infection, steroid flare). Verbal consent given by patient. Medication:  1 cc Depo-medrol 40mg  and 4 cc Lidocaine 1% without epi Time Out taken  Landmarks identified. Area cleansed with alcohol wipes. Using 21 gauge and 1, 1/2 inch needle, Left subacromial bursa space was injected (with above listed medication) via posterior approach cold spray used for superficial anesthetic. Sterile bandage placed. Patient tolerated procedure well without bleeding or paresthesias. No complications.   Results for orders placed or performed in visit on 01/17/24  ANA   Collection Time: 01/17/24  3:27 PM  Result Value Ref Range   Anti Nuclear Antibody (ANA) NEGATIVE NEGATIVE  Rheumatoid Factor   Collection Time: 01/17/24  3:27 PM  Result Value Ref Range   Rheumatoid fact SerPl-aCnc <10 <14 IU/mL  Cyclic citrul peptide antibody, IgG   Collection Time: 01/17/24  3:27 PM  Result Value Ref Range   Cyclic Citrullin Peptide Ab <83 UNITS  C-reactive protein   Collection Time: 01/17/24  3:27 PM  Result Value Ref Range   CRP <3.0  <8.0 mg/L  Sed Rate (ESR)   Collection Time: 01/17/24  3:27 PM  Result Value Ref Range   Sed Rate 9 0 - 30 mm/h      Assessment & Plan:   Problem List Items Addressed This Visit     Fibromyalgia   Overweight (BMI 25.0-29.9)   Pre-diabetes   Other Visit Diagnoses       Chronic bursitis of left shoulder    -  Primary   Relevant Medications   lidocaine (PF) (XYLOCAINE) 1 % injection 4 mL (Completed)   methylPREDNISolone acetate (DEPO-MEDROL) injection 40 mg (Completed)     Chronic joint pain       Relevant Medications   lidocaine (PF) (XYLOCAINE) 1 % injection 4 mL (Completed)   methylPREDNISolone acetate (DEPO-MEDROL) injection 40 mg (Completed)     Oral ulcer            Left shoulder bursitis, chronic Chronic left shoulder pain, likely bursitis, with worsening symptoms recent flare.  Previous right shoulder bursitis responded to injection years ago  See procedure note. Tolerated L Shoulder subacromial bursa injection well. Noted improvement after injection today - Monitor for symptom improvement; consider follow-up injection in three months if symptoms persist. - Consider X-ray > PT > MRI if no improvement with injection.  Fibromyalgia Managed with duloxetine  and muscle relaxants. Recent labs for autoimmune/rheumatology screening normal, excluding other conditions. Exercise limited by pain. - Continue duloxetine  and muscle relaxants as needed. - Explore low-impact exercise options such as elliptical or water aerobics. Defer any referral at this time such as Rheum  Oral ulcers Recurrent oral ulcers likely related to acid reflux, not autoimmune causes - Start omeprazole  40 mg daily to manage acid reflux and reduce oral ulcers. She has existing order/med - Monitor for symptom improvement.  Overweight BMI  History Obesity In past 3 months weight down from 140 lbs to 136, down 4 lbs On Wegovy  1 mg for weight management with mild side effects.  - Continue Wegovy  1 mg  weekly. For maintenance therapy - Schedule follow-up every three months to monitor progress and insurance coverage. - Consider Contrave if Wegovy  is discontinued or ineffective. - Perform weight check every three months for insurance compliance.     Lifestyle Documentation  We had a detailed discussion today reviewing course of lifestyle modifications to assist in weight loss and promote overall health. She remains motivated and committed to continuing these efforts while continues to take weight loss / appetite reducing GLP1 medication.  For diet she has been following caloric reduced diet, down to 1600-1800 calorie intake with emphasis on high protein foods and low carbohydrate intake based on structured meal plan.  For exercise, she has been limited due to fibromyalgia, she remains active and walking when able, she has goals to start low impact exercise regimen and has been given physician guided recommendations such as elliptical machine or water aerobics.  - Schedule follow-up in three months to assess weight and medication efficacy.      No orders of the defined types were placed in this encounter.   Meds ordered this encounter  Medications   lidocaine (PF) (XYLOCAINE) 1 % injection 4 mL   methylPREDNISolone acetate (DEPO-MEDROL) injection 40 mg    Follow up plan: Return in about 3 months (around 04/24/2024) for 3 month Weight Check, L Shoulder update.   Marsa Officer, DO [facility] 01/23/2024, 4:22 PM

## 2024-01-26 ENCOUNTER — Telehealth: Payer: Self-pay | Admitting: Pharmacy Technician

## 2024-01-26 ENCOUNTER — Other Ambulatory Visit (HOSPITAL_COMMUNITY): Payer: Self-pay

## 2024-01-26 NOTE — Telephone Encounter (Signed)
   Pharmacy Patient Advocate Encounter   Received notification from CoverMyMeds that prior authorization for pitavastatin  is required/requested.   Insurance verification completed.   The patient is insured through [facility].  Per every test claim for pitavstatin ndc and brand-livalo  in wam:   But Per pa cmm [id number]:   I called [facility] P: [phone] and they said they run under discount card each time and to ignore pa requests

## 2024-01-30 ENCOUNTER — Ambulatory Visit
Admission: RE | Admit: 2024-01-30 | Discharge: 2024-01-30 | Disposition: A | Discharge status: Home / Self Care | Source: Ambulatory Visit | Attending: Obstetrics and Gynecology | Admitting: Obstetrics and Gynecology

## 2024-01-30 ENCOUNTER — Other Ambulatory Visit: Payer: Self-pay | Admitting: Obstetrics and Gynecology

## 2024-01-30 DIAGNOSIS — R928 Other abnormal and inconclusive findings on diagnostic imaging of breast: Secondary | ICD-10-CM

## 2024-02-06 ENCOUNTER — Encounter: Payer: Self-pay | Admitting: Urology

## 2024-02-06 ENCOUNTER — Other Ambulatory Visit: Payer: Self-pay | Admitting: *Deleted

## 2024-02-06 MED ORDER — SOLIFENACIN SUCCINATE 5 MG PO TABS: 5.0000 mg | ORAL_TABLET | Freq: Every day | ORAL | 2 refills | Status: AC

## 2024-02-12 ENCOUNTER — Other Ambulatory Visit: Payer: Self-pay | Admitting: Family Medicine

## 2024-02-12 DIAGNOSIS — F3341 Major depressive disorder, recurrent, in partial remission: Secondary | ICD-10-CM

## 2024-02-12 DIAGNOSIS — M797 Fibromyalgia: Secondary | ICD-10-CM

## 2024-02-13 NOTE — Telephone Encounter (Signed)
 Requested Prescriptions  Pending Prescriptions Disp Refills   DULoxetine  (CYMBALTA ) 60 MG capsule [Pharmacy Med Name: DULoxetine  HCl 60 MG Oral Capsule Delayed Release Particles] 90 capsule 0    Sig: Take 1 capsule by mouth once daily     Psychiatry: Antidepressants - SNRI - duloxetine  Passed - 02/13/2024  5:16 PM      Passed - Cr in normal range and within 360 days    Creat  Date Value Ref Range Status  07/12/2023 0.66 0.50 - 1.05 mg/dL Final   Creatinine, Ser  Date Value Ref Range Status  11/26/2023 0.69 0.44 - 1.00 mg/dL Final         Passed - eGFR is 30 or above and within 360 days    GFR calc Af Amer  Date Value Ref Range Status  11/14/2018 93 >59 mL/min/1.73 Final   GFR, Estimated  Date Value Ref Range Status  11/26/2023 >60 >60 mL/min Final    Comment:    (NOTE) Calculated using the CKD-EPI Creatinine Equation (2021)    eGFR  Date Value Ref Range Status  01/06/2023 95 > OR = 60 mL/min/1.8m2 Final         Passed - Completed PHQ-2 or PHQ-9 in the last 360 days      Passed - Last BP in normal range    BP Readings from Last 1 Encounters:  01/23/24 124/80         Passed - Valid encounter within last 6 months    Recent Outpatient Visits           3 weeks ago Chronic bursitis of left shoulder   [place] [facility] [place] Marsa PARAS, DO   2 months ago Fibromyalgia   [place] [facility] [place] Marsa PARAS, DO   3 months ago Overweight (BMI 25.0-29.9)   [place] [facility] [place], Marsa PARAS, DO   6 months ago Annual physical exam   [place] [facility] [place] Marsa PARAS, DO       Future Appointments             In 4 months Stoioff, Glendia BROCKS, MD [facility]

## 2024-02-19 ENCOUNTER — Other Ambulatory Visit: Payer: Self-pay | Admitting: Family Medicine

## 2024-02-20 NOTE — Telephone Encounter (Signed)
 Requested Prescriptions  Pending Prescriptions Disp Refills   eletriptan  (RELPAX ) 40 MG tablet [Pharmacy Med Name: Eletriptan  Hydrobromide 40 MG Oral Tablet] 10 tablet 0    Sig: TAKE 1 TABLET BY MOUTH AS NEEDED FOR MIGRAINE OR HEADACHE. MAY REPEAT IN 2 HOURS IF HEADACHE PERSISTS OR RECURS     Neurology:  Migraine Therapy - Triptan Passed - 02/20/2024  2:47 PM      Passed - Last BP in normal range    BP Readings from Last 1 Encounters:  01/23/24 124/80         Passed - Valid encounter within last 12 months    Recent Outpatient Visits           4 weeks ago Chronic bursitis of left shoulder   [place] [facility] [place] Marsa PARAS, DO   2 months ago Fibromyalgia   [place] [facility] [place], Marsa PARAS, DO   4 months ago Overweight (BMI 25.0-29.9)   [place] [facility] [place], Marsa PARAS, DO   7 months ago Annual physical exam   [place] [facility] [place] Marsa PARAS, DO       Future Appointments             In 4 months Stoioff, Glendia BROCKS, MD [facility]

## 2024-03-13 ENCOUNTER — Ambulatory Visit
Admission: RE | Admit: 2024-03-13 | Discharge: 2024-03-13 | Disposition: A | Discharge status: Home / Self Care | Source: Ambulatory Visit | Attending: Family Medicine | Admitting: Family Medicine

## 2024-03-13 ENCOUNTER — Ambulatory Visit: Admitting: Family Medicine

## 2024-03-13 ENCOUNTER — Encounter: Payer: Self-pay | Admitting: Family Medicine

## 2024-03-13 ENCOUNTER — Ambulatory Visit: Payer: Self-pay | Admitting: Family Medicine

## 2024-03-13 VITALS — BP 122/80 | HR 71 | Ht 62.0 in | Wt 133.4 lb

## 2024-03-13 DIAGNOSIS — M25512 Pain in left shoulder: Secondary | ICD-10-CM | POA: Insufficient documentation

## 2024-03-13 DIAGNOSIS — M542 Cervicalgia: Secondary | ICD-10-CM

## 2024-03-13 DIAGNOSIS — G8929 Other chronic pain: Secondary | ICD-10-CM

## 2024-03-13 DIAGNOSIS — M7552 Bursitis of left shoulder: Secondary | ICD-10-CM | POA: Insufficient documentation

## 2024-03-13 DIAGNOSIS — M47812 Spondylosis without myelopathy or radiculopathy, cervical region: Secondary | ICD-10-CM | POA: Diagnosis not present

## 2024-03-13 DIAGNOSIS — M8580 Other specified disorders of bone density and structure, unspecified site: Secondary | ICD-10-CM | POA: Diagnosis not present

## 2024-03-13 DIAGNOSIS — M4802 Spinal stenosis, cervical region: Secondary | ICD-10-CM | POA: Diagnosis not present

## 2024-03-13 NOTE — Progress Notes (Signed)
 Subjective:    Patient ID: Christine Berger, female    DOB: 07/25/1960, 63 y.o.   MRN: [id number]  Christine Berger is a 63 y.o. female presenting on 03/13/2024 for Shoulder Pain (Left )  Patient presents for a same day appointment.  HPI  Discussed the use of AI scribe software for clinical note transcription with the patient, who gave verbal consent to proceed.  History of Present Illness   Christine Berger is a 63 year old female with chronic left shoulder bursitis who presents with worsening shoulder pain.  Left shoulder pain, chronic - Chronic left shoulder pain due to bursitis for >1 year - Recent worsening with sharper and more intense pain, worse with overhead movements - Pain is particularly severe at night, causing sleep disturbances - Difficulty with activities such as putting on a coat and reaching overhead  Response to prior treatments - I performed a subacromial steroid injection in the left shoulder in October without pain relief, suggests structural injury not just inflammation - Not currently taking Celebrex  or muscle relaxers, though both are available - Celebrex  causes stomach discomfort - Methocarbamol (muscle relaxant) helps with sleep and is used as needed; sufficient supply available  Musculoskeletal history - History of right shoulder bursitis five years ago, treated with physical therapy at [facility] office in [facility]          12/05/2023    3:38 PM 10/18/2023    1:51 PM 07/19/2023    2:05 PM  Depression screen PHQ 2/9  Decreased Interest 0 0 1  Down, Depressed, Hopeless 0 0 0  PHQ - 2 Score 0 0 1  Altered sleeping 0 0 1  Tired, decreased energy 1 0 1  Change in appetite 0 0 1  Feeling bad or failure about yourself  0 0 0  Trouble concentrating 0 0 2  Moving slowly or fidgety/restless 0 0 0  Suicidal thoughts 0 0 0  PHQ-9 Score 1  0  6   Difficult doing work/chores   Not difficult at all     Data saved with a previous flowsheet row definition        12/05/2023    3:38 PM 10/18/2023    1:51 PM 07/19/2023    2:06 PM 04/24/2023    3:20 PM  GAD 7 : Generalized Anxiety Score  Nervous, Anxious, on Edge 0 0 0 0  Control/stop worrying 0 0 0 0  Worry too much - different things 0 0 0 0  Trouble relaxing 0 0 0 0  Restless 0 0 0 0  Easily annoyed or irritable 0 0 0 0  Afraid - awful might happen 0 0 0 0  Total GAD 7 Score 0 0 0 0  Anxiety Difficulty Not difficult at all  Not difficult at all     Social History[1]  Review of Systems Per HPI unless specifically indicated above     Objective:    BP 122/80 (BP Location: Right Arm, Patient Position: Sitting, Cuff Size: Normal)   Pulse 71   Ht 5' 2 (1.575 m)   Wt 133 lb 6 oz (60.5 kg)   SpO2 95%   BMI 24.39 kg/m   Wt Readings from Last 3 Encounters:  03/13/24 133 lb 6 oz (60.5 kg)  01/23/24 136 lb (61.7 kg)  12/05/23 138 lb (62.6 kg)    Physical Exam Vitals and nursing note reviewed.  Constitutional:      General: She is not in acute distress.  Appearance: Normal appearance. She is well-developed. She is not diaphoretic.     Comments: Well-appearing, comfortable, cooperative  HENT:     Head: Normocephalic and atraumatic.  Eyes:     General:        Right eye: No discharge.        Left eye: No discharge.     Conjunctiva/sclera: Conjunctivae normal.  Cardiovascular:     Rate and Rhythm: Normal rate.  Pulmonary:     Effort: Pulmonary effort is normal.  Musculoskeletal:     Comments: Similar to previous exam. Left Shoulder reduced range of motion above head motions flexion abduction. Did not repeat test today but positive impingement last visit.  Skin:    General: Skin is warm and dry.     Findings: No erythema or rash.  Neurological:     Mental Status: She is alert and oriented to person, place, and time.  Psychiatric:        Mood and Affect: Mood normal.        Behavior: Behavior normal.        Thought Content: Thought content normal.     Comments: Well  groomed, good eye contact, normal speech and thoughts     I have personally reviewed the radiology report from 03/13/24 on Cervical Spine.  CLINICAL DATA:  Left shoulder pain.   EXAM: CERVICAL SPINE - COMPLETE 4+ VIEW   COMPARISON:  None Available.   FINDINGS: No acute fracture or subluxation of the cervical spine. The bones are osteopenic. Multilevel degenerative changes with disc space narrowing and endplate irregularity and spurring. The visualized posterior elements and odontoid appear intact. There is anatomic alignment of the lateral masses of C1 and C2. The soft tissues are unremarkable.   IMPRESSION: 1. No acute findings. 2. Multilevel degenerative changes.     Electronically Signed   By: Vanetta Chou M.D.   On: 03/13/2024 15:58  CLINICAL DATA:  Chronic left shoulder pain    EXAM: LEFT SHOULDER - 2+ VIEW   COMPARISON:  None Available.   FINDINGS: There is no evidence of fracture or dislocation. There is no evidence of arthropathy or other focal bone abnormality. Soft tissues are unremarkable.   IMPRESSION: Negative.     Electronically Signed   By: Lynwood Landy Raddle M.D.   On: 03/13/2024 16:04  Results for orders placed or performed in visit on 01/17/24  ANA   Collection Time: 01/17/24  3:27 PM  Result Value Ref Range   Anti Nuclear Antibody (ANA) NEGATIVE NEGATIVE  Rheumatoid Factor   Collection Time: 01/17/24  3:27 PM  Result Value Ref Range   Rheumatoid fact SerPl-aCnc <10 <14 IU/mL  Cyclic citrul peptide antibody, IgG   Collection Time: 01/17/24  3:27 PM  Result Value Ref Range   Cyclic Citrullin Peptide Ab <83 UNITS  C-reactive protein   Collection Time: 01/17/24  3:27 PM  Result Value Ref Range   CRP <3.0 <8.0 mg/L  Sed Rate (ESR)   Collection Time: 01/17/24  3:27 PM  Result Value Ref Range   Sed Rate 9 0 - 30 mm/h      Assessment & Plan:   Problem List Items Addressed This Visit   None Visit Diagnoses       Chronic  bursitis of left shoulder    -  Primary   Relevant Orders   DG Shoulder Left (Completed)   Ambulatory referral to Physical Therapy     Chronic left shoulder pain  Relevant Orders   DG Shoulder Left (Completed)   Ambulatory referral to Physical Therapy     Chronic neck pain       Relevant Orders   DG Cervical Spine Complete (Completed)   Ambulatory referral to Physical Therapy        Chronic left shoulder bursitis and pain >1 year, Chronic bursitis with worsening symptoms and possible structural damage. Previous steroid injection ineffective which suggests more structural problem as it should be beneficial even temporarily for bursitis or inflammation only, this previous injection can be considered diagnostic tool.  Her pain affects sleep and daily activities. Differential includes structural damage to shoulder / rotator cuff primary concern  - Ordered left shoulder x-ray to assess for arthropathy or other injury Add Cervical Spine X-ray as well given possibility of referred pain given failed subacromial injection  - Referred to physical therapy at [facility] in [place] - Advised resuming methocarbamol in the evening for sleep and pain. - Discussed optional Celebrex  use, noting gastrointestinal side effects. - Follow-up already scheduled in February to assess progress and consider MRI if no improvement.      Sent results to MyChart on the X-rays.  Cervical Spine X-ray - actually shows that the neck has several levels of wear and tear arthritis degenerative changes and some bone spurs. There is no acute abnormality or severe concern in the neck but it could be a source of pain that radiates into shoulder as referred pain. I am not yet convinced this is the answer, but it could be a possibility.  The Left Shoulder X-ray was entirely normal. No sign of arthritis or fracture or injury. As mentioned, the x-rays cannot identify muscle injury so that does not show up here.  The most  likely answer is still a rotator cuff problem, causing your symptoms. I will place the Referral now to [facility] in [place]   Orders Placed This Encounter  Procedures   DG Shoulder Left    Standing Status:   Future    Number of Occurrences:   1    Expiration Date:   03/13/2025    Reason for Exam (SYMPTOM  OR DIAGNOSIS REQUIRED):   worsening chronic shoulder pain, reduced motion, bursitis concern for rotator cuff    Preferred imaging location?:   [facility] BARE Cervical Spine Complete    Standing Status:   Future    Number of Occurrences:   1    Expiration Date:   03/13/2025    Reason for Exam (SYMPTOM  OR DIAGNOSIS REQUIRED):   chronic L shoulder pain question if referred, neck pain worse with movement    Preferred imaging location?:   [facility]   Ambulatory referral to Physical Therapy    Referral Priority:   Routine    Referral Type:   Physical Medicine    Referral Reason:   Specialty Services Required    Requested Specialty:   Physical Therapy    Number of Visits Requested:   1    No orders of the defined types were placed in this encounter.   Follow up plan: Return if symptoms worsen or fail to improve.  Marsa Officer, DO [facility] 03/13/2024, 3:06 PM     [1]  Social History Tobacco Use   Smoking status: Never   Smokeless tobacco: Never  Vaping Use   Vaping status: Never Used  Substance Use Topics   Alcohol use: Not Currently  Alcohol/week: 1.0 - 2.0 standard drink of alcohol    Types: 1 - 2 Glasses of wine per week    Comment: monthly   Drug use: No

## 2024-03-13 NOTE — Patient Instructions (Addendum)
 Thank you for coming to the office today.  X-ray today  Stay tuned for results on mychart  Resume taking Methocarbamol in evening to help rest with the shoulder pain  Celebrex  is optional  Plan to refer [facility]  [facility] Located in: [facility] Address: [address], [place], [place] [address] Phone: [phone]  Try to proceed w/ their program, obviously if you are in too much pain or not successful with this, they should be able to document this information and it can help with the insurance.   Please schedule a Follow-up Appointment to: Return if symptoms worsen or fail to improve.  If you have any other questions or concerns, please feel free to call the office or send a message through MyChart. You may also schedule an earlier appointment if necessary.  Additionally, you may be receiving a survey about your experience at our office within a few days to 1 week by e-mail or mail. We value your feedback.  Marsa Officer, DO [facility], [place]

## 2024-03-19 DIAGNOSIS — D2361 Other benign neoplasm of skin of right upper limb, including shoulder: Secondary | ICD-10-CM | POA: Diagnosis not present

## 2024-03-19 DIAGNOSIS — L821 Other seborrheic keratosis: Secondary | ICD-10-CM | POA: Diagnosis not present

## 2024-03-19 DIAGNOSIS — L859 Epidermal thickening, unspecified: Secondary | ICD-10-CM | POA: Diagnosis not present

## 2024-03-19 DIAGNOSIS — L718 Other rosacea: Secondary | ICD-10-CM | POA: Diagnosis not present

## 2024-03-30 ENCOUNTER — Other Ambulatory Visit: Payer: Self-pay | Admitting: Family Medicine

## 2024-03-30 DIAGNOSIS — R7303 Prediabetes: Secondary | ICD-10-CM

## 2024-03-30 DIAGNOSIS — E782 Mixed hyperlipidemia: Secondary | ICD-10-CM

## 2024-03-30 DIAGNOSIS — E663 Overweight: Secondary | ICD-10-CM

## 2024-04-02 ENCOUNTER — Encounter: Payer: Self-pay | Admitting: Physical Therapy

## 2024-04-02 ENCOUNTER — Ambulatory Visit: Attending: Family Medicine | Admitting: Physical Therapy

## 2024-04-02 DIAGNOSIS — M7552 Bursitis of left shoulder: Secondary | ICD-10-CM | POA: Diagnosis not present

## 2024-04-02 DIAGNOSIS — G8929 Other chronic pain: Secondary | ICD-10-CM | POA: Insufficient documentation

## 2024-04-02 DIAGNOSIS — M542 Cervicalgia: Secondary | ICD-10-CM | POA: Insufficient documentation

## 2024-04-02 DIAGNOSIS — M25512 Pain in left shoulder: Secondary | ICD-10-CM | POA: Diagnosis present

## 2024-04-02 DIAGNOSIS — M6281 Muscle weakness (generalized): Secondary | ICD-10-CM | POA: Insufficient documentation

## 2024-04-02 NOTE — Telephone Encounter (Signed)
 Requested Prescriptions  Pending Prescriptions Disp Refills   WEGOVY  1 MG/0.5ML SOAJ SQ injection [Pharmacy Med Name: Wegovy  1 MG/0.5ML Subcutaneous Solution Auto-injector] 4 mL 0    Sig: INJECT 1 MG INTO THE SKIN ONCE A WEEK     Endocrinology:  Diabetes - GLP-1 Receptor Agonists - semaglutide  Failed - 04/02/2024  8:34 AM      Failed - HBA1C in normal range and within 180 days    Hgb A1c MFr Bld  Date Value Ref Range Status  07/12/2023 5.6 <5.7 % Final    Comment:    For the purpose of screening for the presence of diabetes: . <5.7%       Consistent with the absence of diabetes 5.7-6.4%    Consistent with increased risk for diabetes             (prediabetes) > or =6.5%  Consistent with diabetes . This assay result is consistent with a decreased risk of diabetes. . Currently, no consensus exists regarding use of hemoglobin A1c for diagnosis of diabetes in children. . According to American Diabetes Association (ADA) guidelines, hemoglobin A1c <7.0% represents optimal control in non-pregnant diabetic patients. Different metrics may apply to specific patient populations.  Standards of Medical Care in Diabetes(ADA). .          Passed - Cr in normal range and within 360 days    Creat  Date Value Ref Range Status  07/12/2023 0.66 0.50 - 1.05 mg/dL Final   Creatinine, Ser  Date Value Ref Range Status  11/26/2023 0.69 0.44 - 1.00 mg/dL Final         Passed - Valid encounter within last 6 months    Recent Outpatient Visits           2 weeks ago Chronic bursitis of left shoulder   [place] [place] Marsa PARAS, DO   2 months ago Chronic bursitis of left shoulder   [place] [facility] [place] Marsa PARAS, DO   3 months ago Fibromyalgia   [facility] [place] Marsa PARAS, DO   5 months ago Overweight (BMI 25.0-29.9)   [place] [place], Marsa PARAS, DO    8 months ago Annual physical exam   [facility] [place], Marsa PARAS, DO       Future Appointments             In 2 months Stoioff, Glendia BROCKS, MD [facility]

## 2024-04-02 NOTE — Progress Notes (Deleted)
 " OUTPATIENT PHYSICAL THERAPY SHOULDER/ELBOW EVALUATION  Patient Name: Christine Berger MRN: [id number] DOB:02-Sep-1960, 64 y.o., female Today's Date: 04/02/2024  END OF SESSION:   Past Medical History:  Diagnosis Date   Allergy    Anemia 2008   Anxiety    Arthritis 1982   Asthma    Cataract    Depression    Family history of premature CAD 06/24/2021   Coronary Calcium  Score 05/26/2020: Calcium  score 0.  Aortic atherosclerosis noted.   Fibromyalgia    GERD (gastroesophageal reflux disease) 1985   Glaucoma    Hyperlipidemia    Hypertension 2021   Neuropathy    folloewd pcp   Osteoporosis    Sleep apnea    On CPAP   Thyroid  disease    Past Surgical History:  Procedure Laterality Date   EYE SURGERY  04/2018   INCONTINENCE SURGERY  10/27/2011   bladder sling   TRANSTHORACIC ECHOCARDIOGRAM  06/04/2020   EF 55 to 60%.  No RWMA.  GR 1 DD with mild LA dilation.  Normal mitral and aortic valves.  Normal RV size and function.  Normal RVP and RAP.  (Normal)   uterine ablation  04/28/2010   Patient Active Problem List   Diagnosis Date Noted   OAB (overactive bladder) 04/06/2022   Overweight (BMI 25.0-29.9) 04/06/2022   Pre-diabetes 07/20/2021   Thoracic aortic atherosclerosis 07/17/2021   Acquired hypothyroidism 07/12/2021   OSA on CPAP 07/12/2021   Family history of premature CAD 06/24/2021   Laryngopharyngeal reflux (LPR) 04/05/2021   Extrinsic asthma 12/04/2020   Seasonal allergies 12/04/2020   Major depressive disorder, recurrent episode, in partial remission 12/04/2020   Anxiety 12/04/2020   Fibromyalgia    Mixed hyperlipidemia 07/23/2013    PCP: Marsa JINNY Officer, DO  REFERRING PROVIDER: Marsa JINNY Officer, DO  REFERRING DIAG: Chronic bursitis of left shoulder, Chronic left shoulder pain, Chronic neck pain   RATIONALE FOR EVALUATION AND TREATMENT: Rehabilitation  THERAPY DIAG: Chronic left shoulder pain  Cervicalgia  ONSET DATE: ***  FOLLOW-UP  APPT SCHEDULED WITH REFERRING PROVIDER: {yes/no:20286}    SUBJECTIVE:                                                                                                                                                                                         SUBJECTIVE STATEMENT:  Pt is a 64 year old female with primary complaint of chronic L shoulder pain > 1 year  PERTINENT HISTORY: Pt is a 64 year old female with primary complaint of chronic L shoulder pain > 1 year. No relief with L shoulder GHJ injection.   Pt currently reports ***  PAIN:  Pain Intensity:  Present: /10, Best: /10, Worst: /10 Pain location: *** Pain Quality: {PAIN DESCRIPTION:21022940}  Radiating: {yes/no:20286}  Numbness/Tingling: {yes/no:20286} Focal Weakness: {yes/no:20286} Aggravating factors: *** Relieving factors: *** 24-hour pain behavior: *** History of prior shoulder or neck/shoulder injury, pain, surgery, or therapy: {yes/no:20286} Falls: Has patient fallen in last 6 months? {yes/no:20286}, Number of falls: *** Dominant hand: {RIGHT/LEFT:20294} Imaging: Yes    CLINICAL DATA:  Left shoulder pain.   EXAM: CERVICAL SPINE - COMPLETE 4+ VIEW   COMPARISON:  None Available.   FINDINGS: No acute fracture or subluxation of the cervical spine. The bones are osteopenic. Multilevel degenerative changes with disc space narrowing and endplate irregularity and spurring. The visualized posterior elements and odontoid appear intact. There is anatomic alignment of the lateral masses of C1 and C2. The soft tissues are unremarkable.   IMPRESSION: 1. No acute findings. 2. Multilevel degenerative changes.     Electronically Signed   By: Vanetta Chou M.D.   On: 03/13/2024 15:58      Red flags (personal history of cancer, chills/fever, night sweats, nausea, vomiting, unrelenting pain, unexplained weight gain/loss): Negative  PRECAUTIONS: {Therapy precautions:24002}  WEIGHT BEARING RESTRICTIONS: {Yes  ***/No:24003}  FALLS: Has patient fallen in last 6 months? {fallsyesno:27318}  Living Environment Lives with: {OPRC lives with:25569::lives with their family} Lives in: {Lives in:25570} Stairs: {opstairs:27293} Has following equipment at home: {Assistive devices:23999}  Prior level of function: {PLOF:24004}  Occupational demands:   Hobbies:   Patient Goals: ***    OBJECTIVE:   Patient Surveys  QuickDASH:  Cognition Patient is oriented to person, place, and time.  Recent memory is intact.  Remote memory is intact.  Attention span and concentration are intact.  Expressive speech is intact.  Patient's fund of knowledge is within normal limits for educational level.    Gross Musculoskeletal Assessment Tremor: None Bulk: Normal Tone: Normal  Gait   Posture   Cervical Screen AROM: WFL and painless with overpressure in all planes Spurlings A (ipsilateral lateral flexion/axial compression): R: {NEGATIVE/POSITIVE QNM:80001} L: {NEGATIVE/POSITIVE QNM:80001} Spurlings B (ipsilateral lateral flexion/contralateral rotation/axial compression): R: {NEGATIVE/POSITIVE QNM:80001} L: {NEGATIVE/POSITIVE QNM:80001} Repeated movement: No centralization or peripheralization with protraction or retraction Hoffman Sign (cervical cord compression): R: {NEGATIVE/POSITIVE QNM:80001} L: {NEGATIVE/POSITIVE QNM:80001} ULTT Median: R: {NEGATIVE/POSITIVE QNM:80001} L: {NEGATIVE/POSITIVE QNM:80001} ULTT Ulnar: R: {NEGATIVE/POSITIVE QNM:80001} L: {NEGATIVE/POSITIVE QNM:80001} ULTT Radial: R: {NEGATIVE/POSITIVE QNM:80001} L: {NEGATIVE/POSITIVE QNM:80001}  AROM AROM (Normal range in degrees) AROM  Cervical  Flexion (50)   Extension (80)   Right lateral flexion (45)   Left lateral flexion (45)   Right rotation (85)   Left rotation (85)    Right Left  Shoulder    Flexion    Extension    Abduction    External Rotation    Internal Rotation    Hands Behind Head    Hands Behind Back         Elbow    Flexion    Extension    Pronation    Supination    (* = pain; Blank rows = not tested)  UE MMT: MMT (out of 5) Right Left   Cervical (isometric)  Flexion WNL  Extension WNL  Lateral Flexion WNL WNL  Rotation WNL WNL      Shoulder   Flexion    Extension    Abduction    External rotation    Internal rotation    Horizontal abduction    Horizontal adduction    Lower Trapezius    Rhomboids  Elbow  Flexion    Extension    Pronation    Supination        Wrist  Flexion    Extension    Radial deviation    Ulnar deviation        MCP  Flexion    Extension    Abduction    Adduction    (* = pain; Blank rows = not tested)  Sensation Grossly intact to light touch bilateral UE as determined by testing dermatomes C2-T2. Proprioception and hot/cold testing deferred on this date.  Reflexes R/L Biceps (L3/4): 2+/2+  Triceps (S1/2): 2+/2+  Brachioradialis: 2+/2  Palpation Location LEFT  RIGHT           Subocciptials    Cervical paraspinals    Upper Trapezius    Levator Scapulae    Rhomboid Major/Minor    Sternoclavicular joint    Acromioclavicular joint    Coracoid process    Long head of biceps    Supraspinatus    Infraspinatus    Subscapularis    Teres Minor    Teres Major    Pectoralis Major    Pectoralis Minor    Anterior Deltoid    Lateral Deltoid    Posterior Deltoid    Latissimus Dorsi    Sternocleidomastoid    (Blank rows = not tested) Graded on 0-4 scale (0 = no pain, 1 = pain, 2 = pain with wincing/grimacing/flinching, 3 = pain with withdrawal, 4 = unwilling to allow palpation), (Blank rows = not tested)   Passive Accessory Intervertebral Motion Pt denies reproduction of shoulder pain with CPA C2-T7 and UPA bilaterally C2-T7. Generally, hypomobile throughout  Accessory Motions/Glides Glenohumeral: Posterior: R: {normal/abnormal/not examined:14677} L: {normal/abnormal/not examined:14677} Inferior: R:  {normal/abnormal/not examined:14677} L: {normal/abnormal/not examined:14677} Anterior: R: {normal/abnormal/not examined:14677} L: {normal/abnormal/not examined:14677}  Acromioclavicular:  Posterior: R: {normal/abnormal/not examined:14677} L: {normal/abnormal/not examined:14677} Anterior: R: {normal/abnormal/not examined:14677} L: {normal/abnormal/not examined:14677}  Sternoclavicular: Posterior: R: {normal/abnormal/not examined:14677} L: {normal/abnormal/not examined:14677} Anterior: R: {normal/abnormal/not examined:14677} L: {normal/abnormal/not examined:14677} Superior: R: {normal/abnormal/not examined:14677} L: {normal/abnormal/not examined:14677} Inferior: R: {normal/abnormal/not examined:14677} L: {normal/abnormal/not examined:14677}  Scapulothoracic: Distraction: R: {normal/abnormal/not examined:14677} L: {normal/abnormal/not examined:14677} Medial: R: {normal/abnormal/not examined:14677} L: {normal/abnormal/not examined:14677} Lateral: R: {normal/abnormal/not examined:14677} L: {normal/abnormal/not examined:14677} Inferior: R: {normal/abnormal/not examined:14677} L: {normal/abnormal/not examined:14677} Superior: R: {normal/abnormal/not examined:14677} L: {normal/abnormal/not examined:14677} Upward Rotation: R: {normal/abnormal/not examined:14677} L: {normal/abnormal/not examined:14677} Downward Rotation: R: {normal/abnormal/not examined:14677} L: {normal/abnormal/not examined:14677}  Muscle Length Testing Pectoralis Major: R: {normal/abnormal/not examined:14677} L: {normal/abnormal/not examined:14677} Pectoralis Minor: R: {normal/abnormal/not examined:14677} L: {normal/abnormal/not examined:14677} Biceps: R: {normal/abnormal/not examined:14677} L: {normal/abnormal/not examined:14677}  SPECIAL TESTS Rotator Cuff  Drop Arm Test: {NEGATIVE/POSITIVE QNM:80001} Painful Arc (Pain from 60 to 120 degrees scaption): {NEGATIVE/POSITIVE QNM:80001} Infraspinatus Muscle Test:  {NEGATIVE/POSITIVE QNM:80001} If all 3 tests positive, the probability of a full-thickness rotator cuff tear is 91%  Subacromial Impingement Hawkins-Kennedy: {NEGATIVE/POSITIVE QNM:80001} Neer (Block scapula, PROM flexion): {NEGATIVE/POSITIVE QNM:80001} Painful Arc (Pain from 60 to 120 degrees scaption): {NEGATIVE/POSITIVE QNM:80001} Empty Can: {NEGATIVE/POSITIVE QNM:80001} External Rotation Resistance: {NEGATIVE/POSITIVE QNM:80001} Horizontal Adduction: {NEGATIVE/POSITIVE QNM:80001} Scapular Assist: {NEGATIVE/POSITIVE QNM:80001} Positive Hawkins-Kennedy, Painful arc sign, Infraspinatus muscle test then +LR: 10.56 of some type of impingement present, 2/3 tests: +LR 5.06, -LR 0.17, Positive 3/5 Hawkins-Kennedy, neer, painful arc, empty can, and external rotation resistance then SN: .75 (.54-.96) SP: .74 (.61-.88) +LR: 2.93 (1.60-5.36) -LR: .34 (.14-.80)  Labral Tear Biceps Load II (120 elevation, full ER, 90 elbow flexion, full supination, resisted elbow flexion): {NEGATIVE/POSITIVE QNM:80001} Crank (160 scaption, axial load with IR/ER): {NEGATIVE/POSITIVE QNM:80001} O'Briens/Active  Compression Test (90 shoulder flexion, 10 adduction, full IR): {NEGATIVE/POSITIVE QNM:80001}  Bicep Tendon Pathology Speed (shoulder flexion to 90, external rotation, full elbow extension, and forearm supination with resistance: {NEGATIVE/POSITIVE QNM:80001} Yergason's (resisted shoulder ER and supination/biceps tendon pathology): {NEGATIVE/POSITIVE QNM:80001}  Shoulder Instability Sulcus Sign: {NEGATIVE/POSITIVE QNM:80001} Anterior Apprehension: {NEGATIVE/POSITIVE QNM:80001}  Beighton scale  LEFT  RIGHT           1. Passive dorsiflexion and hyperextension of the fifth MCP joint beyond 90  0 0   2. Passive apposition of the thumb to the flexor aspect of the forearm  0  0   3. Passive hyperextension of the elbow beyond 10  0  0   4. Passive hyperextension of the knee beyond 10  0  0   5. Active forward  flexion of the trunk with the knees fully extended so that the palms of the hands rest flat on the floor   0   TOTAL         0/ 9      TODAY'S TREATMENT  ***   PATIENT EDUCATION:  Education details: *** Person educated: {Person educated:25204} Education method: {Education Method:25205} Education comprehension: {Education Comprehension:25206}   HOME EXERCISE PROGRAM:    ASSESSMENT:  CLINICAL IMPRESSION: Patient is a *** y.o. *** who was seen today for physical therapy evaluation and treatment for ***.   OBJECTIVE IMPAIRMENTS: {opptimpairments:25111}.   ACTIVITY LIMITATIONS: {activitylimitations:27494}  PARTICIPATION LIMITATIONS: {participationrestrictions:25113}  PERSONAL FACTORS: {Personal factors:25162} are also affecting patient's functional outcome.   REHAB POTENTIAL: {rehabpotential:25112}  CLINICAL DECISION MAKING: {clinical decision making:25114}  EVALUATION COMPLEXITY: {Evaluation complexity:25115}   GOALS: Goals reviewed with patient? Yes  SHORT TERM GOALS: Target date: {follow up:25551}  Pt will be independent with HEP to improve strength and decrease shoulder pain to improve pain-free function at home and work. Baseline: *** Goal status: INITIAL   LONG TERM GOALS: Target date: {follow up:25551}  Pt will increase FOTO to at least *** to demonstrate significant improvement in function at home and work related to shoulder pain  Baseline:  Goal status: INITIAL  2.  Pt will decrease worst shoulder pain by at least 3 points on the NPRS in order to demonstrate clinically significant reduction in shoulder pain. Baseline: *** Goal status: INITIAL  3.  Pt will decrease quick DASH score by at least 8% in order to demonstrate clinically significant reduction in disability related to shoulder pain        Baseline: *** Goal status: INITIAL  4. Pt will increase strength by at least 1/2 MMT grade in order to demonstrate improvement in strength and function          Baseline: *** Goal status: INITIAL   PLAN: PT FREQUENCY: 1-2x/week  PT DURATION: 6 weeks  PLANNED INTERVENTIONS: Therapeutic exercises, Therapeutic activity, Neuromuscular re-education, Balance training, Gait training, Patient/Family education, Self Care, Joint mobilization, Joint manipulation, Vestibular training, Canalith repositioning, Orthotic/Fit training, DME instructions, Dry Needling, Electrical stimulation, Spinal manipulation, Spinal mobilization, Cryotherapy, Moist heat, Taping, Traction, Ultrasound, Ionotophoresis 4mg /ml Dexamethasone , Manual therapy, and Re-evaluation.  PLAN FOR NEXT SESSION: ***   Venetia Endo, PT, DPT [phone]  Venetia ONEIDA Endo, PT 04/02/2024, 12:07 PM  "

## 2024-04-02 NOTE — Therapy (Signed)
 " OUTPATIENT PHYSICAL THERAPY SHOULDER/UPPER QUARTER EVALUATION  Patient Name: Christine Berger MRN: [id number] DOB:04/27/1960, 64 y.o., female Today's Date: 04/02/2024  END OF SESSION:  PT End of Session - 04/04/24 1023     Visit Number 1    Number of Visits 13    Date for Recertification  05/17/24    PT Start Time 1557    PT Stop Time 1640    PT Time Calculation (min) 43 min    Activity Tolerance Patient tolerated treatment well    Behavior During Therapy [facility] for tasks assessed/performed          Past Medical History:  Diagnosis Date   Allergy    Anemia 2008   Anxiety    Arthritis 1982   Asthma    Cataract    Depression    Family history of premature CAD 06/24/2021   Coronary Calcium  Score 05/26/2020: Calcium  score 0.  Aortic atherosclerosis noted.   Fibromyalgia    GERD (gastroesophageal reflux disease) 1985   Glaucoma    Hyperlipidemia    Hypertension 2021   Neuropathy    folloewd pcp   Osteoporosis    Sleep apnea    On CPAP   Thyroid  disease    Past Surgical History:  Procedure Laterality Date   EYE SURGERY  04/2018   INCONTINENCE SURGERY  10/27/2011   bladder sling   TRANSTHORACIC ECHOCARDIOGRAM  06/04/2020   EF 55 to 60%.  No RWMA.  GR 1 DD with mild LA dilation.  Normal mitral and aortic valves.  Normal RV size and function.  Normal RVP and RAP.  (Normal)   uterine ablation  04/28/2010   Patient Active Problem List   Diagnosis Date Noted   OAB (overactive bladder) 04/06/2022   Overweight (BMI 25.0-29.9) 04/06/2022   Pre-diabetes 07/20/2021   Thoracic aortic atherosclerosis 07/17/2021   Acquired hypothyroidism 07/12/2021   OSA on CPAP 07/12/2021   Family history of premature CAD 06/24/2021   Laryngopharyngeal reflux (LPR) 04/05/2021   Extrinsic asthma 12/04/2020   Seasonal allergies 12/04/2020   Major depressive disorder, recurrent episode, in partial remission 12/04/2020   Anxiety 12/04/2020   Fibromyalgia    Mixed hyperlipidemia 07/23/2013      PCP: Marsa JINNY Officer, DO   REFERRING PROVIDER: Marsa JINNY Officer, DO   REFERRING DIAG: Chronic bursitis of left shoulder, Chronic left shoulder pain, Chronic neck pain    RATIONALE FOR EVALUATION AND TREATMENT: Rehabilitation   THERAPY DIAG: Chronic left shoulder pain   Cervicalgia   ONSET DATE: Fall 2020   FOLLOW-UP APPT SCHEDULED WITH REFERRING PROVIDER: Yes ; 04/30/24     SUBJECTIVE:  SUBJECTIVE STATEMENT:  Pt is a 64 year old female with primary complaint of chronic L shoulder pain > 1 year   PERTINENT HISTORY: Pt is a 64 year old female with primary complaint of chronic L shoulder pain > 1 year. No relief with L shoulder GHJ injection.    Pt currently reports L shoulder pain and bone spurs in the neck. Pt reports pain with shoulder elevation, insidious onset, chronic pain. Pt received shots. Worsening pain since 2020. Fibromyalgia + neck pain with turning head. Heat helps with pain relief. Pt. Reports night pain due to laying on one side for a prolonged time. Pt reports swelling which exacerbates the pain. Pt reports pain at top of L shoulder infraspinatus and deltoid region. Pt has to use muscle relaxer at night sometimes to get to sleep.    PAIN:  Pain Intensity: Present: 5/10, Best: 3/10, Worst: 10/10 Pain location: R posterior cuff, R lateral deltoid, R ACJ region  Pain Quality: aching, stiffness; intermittent throbbing at night Radiating: Yes ; down RUE as far as her hand Numbness/Tingling: Yes; in periscapular region  Focal Weakness: No Aggravating factors: shoulder abduction, shoulder elevation; sidelying on L side; turning head; lifting laundry basket, holding heavy item bilat UE Relieving factors: heat,  24-hour pain behavior: Night time  History of prior shoulder or  neck/shoulder injury, pain, surgery, or therapy: No Falls: Has patient fallen in last 6 months? No, Number of falls: N/A Dominant hand: right Imaging: Yes      CLINICAL DATA:  Left shoulder pain.   EXAM: CERVICAL SPINE - COMPLETE 4+ VIEW   COMPARISON:  None Available.   FINDINGS: No acute fracture or subluxation of the cervical spine. The bones are osteopenic. Multilevel degenerative changes with disc space narrowing and endplate irregularity and spurring. The visualized posterior elements and odontoid appear intact. There is anatomic alignment of the lateral masses of C1 and C2. The soft tissues are unremarkable.   IMPRESSION: 1. No acute findings. 2. Multilevel degenerative changes.     Electronically Signed   By: Vanetta Chou M.D.   On: 03/13/2024 15:58      CLINICAL DATA:  Chronic left shoulder pain   EXAM: LEFT SHOULDER - 2+ VIEW   COMPARISON:  None Available.   FINDINGS: There is no evidence of fracture or dislocation. There is no evidence of arthropathy or other focal bone abnormality. Soft tissues are unremarkable.   IMPRESSION: Negative.     Electronically Signed   By: Lynwood Landy Raddle M.D.   On: 03/13/2024 16:04    Red flags (personal history of cancer, chills/fever, night sweats, nausea, vomiting, unrelenting pain, unexplained weight gain/loss): Negative   PRECAUTIONS: None   WEIGHT BEARING RESTRICTIONS: No   FALLS: Has patient fallen in last 6 months? 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  Recent memory is  intact.  Remote memory is intact.  Attention span and concentration are intact.  Expressive speech is intact.  Patient's fund of knowledge is within normal limits for educational level.  Gross Musculoskeletal Assessment Tremor: None Bulk: Normal Tone: Normal  Posture Fair self-selected posture, pt has mild rounded shoulders   Cervical Screen AROM: See AROM table below, localized neck and paracervical discomfort with flexion/extension and lateral flexion R/L; no concordant sign Distraction: Negative Spurlings A (ipsilateral lateral flexion/axial compression): R: Negative L: Negative Repeated movement: Not examined ULTT Median: R: Not examined L: Not examined ULTT Ulnar: R: Not examined L: Not examined ULTT Radial: R: Not examined L: Not examined   AROM     AROM (Normal range in degrees) AROM 04/02/24  Cervical  Flexion (50) WNL* (axial upper C-spine and CT junction)   Extension (80) Min motion loss* (pain in posterior C-spine/UTs)  Right lateral flexion (45) Min motion loss*   Left lateral flexion (45) Min motion loss*   Right rotation (85) WNL   Left rotation (85) WNL     Right Left  Shoulder      Flexion  WNL  93*  Extension      Abduction  WNL  44*  External Rotation (arm at side)  85 55   Internal Rotation      Hands Behind Head T3   Top of occiput  Hands Behind Back  T7 L3          (* = pain; Blank rows = not tested)  PROM: Shoulder flexion: R 150, L 94 Shoulder abduction: R WNL, L 85     UE MMT: MMT (out of 5) Right 04/02/24 Left 04/02/24          Shoulder   Flexion  5 4-*   Extension      Abduction  5 3+*   External rotation  5 4-*   Internal rotation  5  4*  Horizontal abduction      Horizontal adduction      Lower Trapezius      Rhomboids             Elbow  Flexion  5  5  Extension      Pronation      Supination             (* = pain; Blank rows = not tested)   Sensation Grossly intact to light touch bilateral UE as  determined by testing dermatomes C2-T2. Proprioception and hot/cold testing deferred on this date.   Reflexes R/L Biceps (L3/4): 2+/2+  Triceps (S1/2): 2+/2+  Brachioradialis: 2+/2   Palpation Location LEFT  RIGHT           Subocciptials      Cervical paraspinals      Upper Trapezius 1     Levator Scapulae 1     Rhomboid Major/Minor 0     Sternoclavicular joint      Acromioclavicular joint 1     Coracoid process  1    Long head of biceps  2    Supraspinatus 3     Infraspinatus 2     Subscapularis      Teres Minor      Teres Major      Pectoralis Major      Pectoralis Minor      Anterior Deltoid 1     Lateral Deltoid 1     Posterior Deltoid 1     Latissimus Dorsi      Sternocleidomastoid      (Blank rows = not tested) Graded on 0-4 scale (0 = no pain, 1 = pain, 2 = pain with wincing/grimacing/flinching, 3 = pain  with withdrawal, 4 = unwilling to allow palpation), (Blank rows = not tested)    Accessory Motions/Glides To be assessed at next visit   SPECIAL TESTS Rotator Cuff  Drop Arm Test: Positive Painful Arc (Pain from 60 to 120 degrees scaption): Positive Infraspinatus Muscle Test: Positive If all 3 tests positive, the probability of a full-thickness rotator cuff tear is 91%   Subacromial Impingement Painful Arc (Pain from 60 to 120 degrees scaption): Positive Empty Can: Unable to tolerate testing position  External Rotation Resistance: Positive   Bicep Tendon Pathology Speed (shoulder flexion to 90, external rotation, full elbow extension, and forearm supination with resistance: Positive Yergason's (resisted shoulder ER and supination/biceps tendon pathology): Positive        TODAY'S TREATMENT     04/02/24    Therapeutic Exercise - for HEP establishment, discussion on appropriate exercise/activity modification, PT education  Patient education on current condition, anatomy involved, prognosis, plan of care. Discussion on activity modification to prevent  flare-up of condition, including substituting with opposite UE as able and avoidance of painful positions/postures.   Reviewed baseline home exercises and provided handout for [employer] program (see Access Code); tactile cueing and therapist demonstration utilized as needed for carryover of proper technique to HEP.        PATIENT EDUCATION:  Education details: see above for patient education details Person educated: Patient Education method: Explanation, Demonstration, and Handouts Education comprehension: verbalized understanding and returned demonstration     HOME EXERCISE PROGRAM:   Access Code: [email or web address] URL: [email or web address] Date: 04/02/2024 Prepared by: Venetia Endo  Exercises - Flexion-Extension Shoulder Pendulum with Table Support  - 2 x daily - 7 x weekly - 2 sets - 10 reps - Horizontal Shoulder Pendulum with Table Support  - 2 x daily - 7 x weekly - 2 sets - 10 reps - Seated Scapular Retraction  - 2 x daily - 7 x weekly - 2 sets - 10 reps - 3sec hold    ASSESSMENT:   CLINICAL IMPRESSION: Patient is a 64 y.o. female who was seen today for physical therapy evaluation and treatment for chronic L shoulder pain and mobility deficits with comorbid neck pain and known C-spine degenerative changes. Pt has Hx of negative L shoulder radiographs. Clinical presentation is consistent with RTC pathology such as degenerative RTC tear. Cervical spine screen does not reproduce notable concordant symptoms. Pt does not have S&S consistent with classic radiculopathy. Pt has current deficits in: L shoulder AROM, deltoid/RTC strength, L shoulder stiffness, L anterior shoulder pain and pain along posterior cuff region, and decreased LUE functional use. Pt will continue to benefit from skilled PT services to address deficits and improve function.   OBJECTIVE IMPAIRMENTS: decreased ROM, decreased strength, hypomobility, impaired UE functional use, postural dysfunction, and pain.     ACTIVITY LIMITATIONS: carrying, lifting, sleeping, transfers, bed mobility, dressing, and reach over head   PARTICIPATION LIMITATIONS: meal prep, cleaning, laundry, and driving   PERSONAL FACTORS: Past/current experiences, Time since onset of injury/illness/exacerbation, and 3+ comorbidities: (fibromyalgia, osteoporosis, depression, anxiety, HTN, HLD, thyroid  disease) are also affecting patient's functional outcome.    REHAB POTENTIAL: Good   CLINICAL DECISION MAKING: Evolving/moderate complexity   EVALUATION COMPLEXITY: Moderate     GOALS: Goals reviewed with patient? Yes   SHORT TERM GOALS: Target date: 04/26/2024   Pt will be independent with HEP to improve strength and decrease shoulder pain to improve pain-free function at home and work. Baseline: 04/03/23: Baseline HEP reviewed, RTC isometrics to be given at  subsequent visit.  Goal status: INITIAL     LONG TERM GOALS: Target date: 05/31/2024   Pt will achieve L shoulder AROM within 10 degrees of contralateral upper limb as needed for reaching, overhead activity, and self-care ADLs  Baseline: 04/03/23: Significant AROM limitations in all planes Goal status: INITIAL   2.  Pt will decrease worst shoulder pain by at least 3 points on the NPRS in order to demonstrate clinically significant reduction in shoulder pain. Baseline: 04/03/23: 10/10 at worst Goal status: INITIAL   3.  Pt will improve quick DASH score by at least 8% in order to demonstrate clinically significant reduction in disability related to shoulder pain                  Baseline: 04/03/23: 45.5% Goal status: INITIAL   4. Pt will increase strength by at least 1/2 MMT grade in order to demonstrate improvement in strength and function            Baseline: 04/03/23: L shoulder 3+ to 4/5 Goal status: INITIAL     PLAN: PT FREQUENCY: 1-2x/week   PT DURATION: 6-8 weeks   PLANNED INTERVENTIONS: Therapeutic exercises, Therapeutic activity, Neuromuscular re-education,  Balance training, Gait training, Patient/Family education, Self Care, Joint mobilization, Joint manipulation, Vestibular training, Canalith repositioning, Orthotic/Fit training, DME instructions, Dry Needling, Electrical stimulation, Spinal manipulation, Spinal mobilization, Cryotherapy, Moist heat, Taping, Traction, Ultrasound, Ionotophoresis 4mg /ml Dexamethasone , Manual therapy, and Re-evaluation.   PLAN FOR NEXT SESSION: Check GHJ and scapulothoracic passive accessories. Continue with soft tissue mobilization and gentle manual techniques for pain control. Initiate isometrics and progressive shoulder AAROM as tolerated. Incorporate stretching and manual therapy for paracervical soft tissues prn.     Venetia Endo, PT, DPT #[id number]   Venetia ONEIDA Endo, PT 04/05/2024 9:42 AM  "

## 2024-04-04 ENCOUNTER — Encounter: Payer: Self-pay | Admitting: Physical Therapy

## 2024-04-08 ENCOUNTER — Ambulatory Visit: Admitting: Physical Therapy

## 2024-04-10 ENCOUNTER — Ambulatory Visit: Admitting: Physical Therapy

## 2024-04-10 ENCOUNTER — Encounter: Payer: Self-pay | Admitting: Family Medicine

## 2024-04-10 DIAGNOSIS — G8929 Other chronic pain: Secondary | ICD-10-CM

## 2024-04-10 DIAGNOSIS — M7552 Bursitis of left shoulder: Secondary | ICD-10-CM

## 2024-04-16 ENCOUNTER — Ambulatory Visit: Admitting: Physical Therapy

## 2024-04-16 NOTE — Therapy (Unsigned)
 " OUTPATIENT PHYSICAL THERAPY SHOULDER/UPPER QUARTER TREATMENT  Patient Name: Christine Berger MRN: [id number] DOB:January 21, 1961, 64 y.o., female Today's Date: 04/16/2024   END OF SESSION:    Past Medical History:  Diagnosis Date   Allergy    Anemia 2008   Anxiety    Arthritis 1982   Asthma    Cataract    Depression    Family history of premature CAD 06/24/2021   Coronary Calcium  Score 05/26/2020: Calcium  score 0.  Aortic atherosclerosis noted.   Fibromyalgia    GERD (gastroesophageal reflux disease) 1985   Glaucoma    Hyperlipidemia    Hypertension 2021   Neuropathy    folloewd pcp   Osteoporosis    Sleep apnea    On CPAP   Thyroid  disease    Past Surgical History:  Procedure Laterality Date   EYE SURGERY  04/2018   INCONTINENCE SURGERY  10/27/2011   bladder sling   TRANSTHORACIC ECHOCARDIOGRAM  06/04/2020   EF 55 to 60%.  No RWMA.  GR 1 DD with mild LA dilation.  Normal mitral and aortic valves.  Normal RV size and function.  Normal RVP and RAP.  (Normal)   uterine ablation  04/28/2010   Patient Active Problem List   Diagnosis Date Noted   OAB (overactive bladder) 04/06/2022   Overweight (BMI 25.0-29.9) 04/06/2022   Pre-diabetes 07/20/2021   Thoracic aortic atherosclerosis 07/17/2021   Acquired hypothyroidism 07/12/2021   OSA on CPAP 07/12/2021   Family history of premature CAD 06/24/2021   Laryngopharyngeal reflux (LPR) 04/05/2021   Extrinsic asthma 12/04/2020   Seasonal allergies 12/04/2020   Major depressive disorder, recurrent episode, in partial remission 12/04/2020   Anxiety 12/04/2020   Fibromyalgia    Mixed hyperlipidemia 07/23/2013     PCP: Marsa JINNY Officer, DO   REFERRING PROVIDER: Marsa JINNY Officer, DO   REFERRING DIAG: Chronic bursitis of left shoulder, Chronic left shoulder pain, Chronic neck pain    RATIONALE FOR EVALUATION AND TREATMENT: Rehabilitation   THERAPY DIAG: Chronic left shoulder pain   Cervicalgia   ONSET  DATE: Fall 2020   FOLLOW-UP APPT SCHEDULED WITH REFERRING PROVIDER: Yes ; 04/30/24     SUBJECTIVE:                                                                                                                                                                                          SUBJECTIVE STATEMENT:  Pt is a 65 year old female with primary complaint of chronic L shoulder pain > 1 year   PERTINENT HISTORY: Pt is a 64 year old female with primary complaint of chronic L shoulder pain > 1 year. No  relief with L shoulder GHJ injection.    Pt currently reports L shoulder pain and bone spurs in the neck. Pt reports pain with shoulder elevation, insidious onset, chronic pain. Pt received shots. Worsening pain since 2020. Fibromyalgia + neck pain with turning head. Heat helps with pain relief. Pt. Reports night pain due to laying on one side for a prolonged time. Pt reports swelling which exacerbates the pain. Pt reports pain at top of L shoulder infraspinatus and deltoid region. Pt has to use muscle relaxer at night sometimes to get to sleep.    PAIN:  Pain Intensity: Present: 5/10, Best: 3/10, Worst: 10/10 Pain location: R posterior cuff, R lateral deltoid, R ACJ region  Pain Quality: aching, stiffness; intermittent throbbing at night Radiating: Yes ; down RUE as far as her hand Numbness/Tingling: Yes; in periscapular region  Focal Weakness: No Aggravating factors: shoulder abduction, shoulder elevation; sidelying on L side; turning head; lifting laundry basket, holding heavy item bilat UE Relieving factors: heat,  24-hour pain behavior: Night time  History of prior shoulder or neck/shoulder injury, pain, surgery, or therapy: No Falls: Has patient fallen in last 6 months? No, Number of falls: N/A Dominant hand: right Imaging: Yes      CLINICAL DATA:  Left shoulder pain.   EXAM: CERVICAL SPINE - COMPLETE 4+ VIEW   COMPARISON:  None Available.   FINDINGS: No acute fracture or  subluxation of the cervical spine. The bones are osteopenic. Multilevel degenerative changes with disc space narrowing and endplate irregularity and spurring. The visualized posterior elements and odontoid appear intact. There is anatomic alignment of the lateral masses of C1 and C2. The soft tissues are unremarkable.   IMPRESSION: 1. No acute findings. 2. Multilevel degenerative changes.     Electronically Signed   By: Vanetta Chou M.D.   On: 03/13/2024 15:58      CLINICAL DATA:  Chronic left shoulder pain   EXAM: LEFT SHOULDER - 2+ VIEW   COMPARISON:  None Available.   FINDINGS: There is no evidence of fracture or dislocation. There is no evidence of arthropathy or other focal bone abnormality. Soft tissues are unremarkable.   IMPRESSION: Negative.     Electronically Signed   By: Lynwood Landy Raddle M.D.   On: 03/13/2024 16:04    Red flags (personal history of cancer, chills/fever, night sweats, nausea, vomiting, unrelenting pain, unexplained weight gain/loss): Negative   PRECAUTIONS: None   WEIGHT BEARING RESTRICTIONS: No   FALLS: Has patient fallen in last 6 months? 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  Recent memory is intact.  Remote memory is intact.  Attention span and concentration are intact.  Expressive speech is intact.  Patient's fund of knowledge is within normal limits for educational level.                          Gross Musculoskeletal Assessment Tremor: None Bulk: Normal Tone: Normal  Posture Fair  self-selected posture, pt has mild rounded shoulders   Cervical Screen AROM:  See AROM table below, localized neck and paracervical discomfort with flexion/extension and lateral flexion R/L; no concordant sign Distraction: Negative Spurlings A (ipsilateral lateral flexion/axial compression): R: Negative L: Negative Repeated movement: Not examined ULTT Median: R: Not examined L: Not examined ULTT Ulnar: R: Not examined L: Not examined ULTT Radial: R: Not examined L: Not examined   AROM     AROM (Normal range in degrees) AROM 04/02/24  Cervical  Flexion (50) WNL* (axial upper C-spine and CT junction)   Extension (80) Min motion loss* (pain in posterior C-spine/UTs)  Right lateral flexion (45) Min motion loss*   Left lateral flexion (45) Min motion loss*   Right rotation (85) WNL   Left rotation (85) WNL     Right Left  Shoulder      Flexion  WNL  93*  Extension      Abduction  WNL  44*  External Rotation (arm at side)  85 55   Internal Rotation      Hands Behind Head T3   Top of occiput  Hands Behind Back  T7 L3          (* = pain; Blank rows = not tested)  PROM: Shoulder flexion: R 150, L 94 Shoulder abduction: R WNL, L 85     UE MMT: MMT (out of 5) Right 04/02/24 Left 04/02/24          Shoulder   Flexion  5 4-*   Extension      Abduction  5 3+*   External rotation  5 4-*   Internal rotation  5  4*  Horizontal abduction      Horizontal adduction      Lower Trapezius      Rhomboids             Elbow  Flexion  5  5  Extension      Pronation      Supination             (* = pain; Blank rows = not tested)   Sensation Grossly intact to light touch bilateral UE as determined by testing dermatomes C2-T2. Proprioception and hot/cold testing deferred on this date.   Reflexes R/L Biceps (L3/4): 2+/2+  Triceps (S1/2): 2+/2+  Brachioradialis: 2+/2   Palpation Location LEFT  RIGHT           Subocciptials      Cervical paraspinals      Upper Trapezius 1      Levator Scapulae 1     Rhomboid Major/Minor 0     Sternoclavicular joint      Acromioclavicular joint 1     Coracoid process  1    Long head of biceps  2    Supraspinatus 3     Infraspinatus 2     Subscapularis      Teres Minor      Teres Major      Pectoralis Major      Pectoralis Minor      Anterior Deltoid 1     Lateral Deltoid 1     Posterior Deltoid 1     Latissimus Dorsi      Sternocleidomastoid      (Blank rows = not tested) Graded on 0-4 scale (0 = no pain, 1 = pain, 2 = pain with wincing/grimacing/flinching, 3 = pain with withdrawal, 4 = unwilling to allow palpation), (Blank rows = not tested)    Accessory Motions/Glides To be assessed at next visit  SPECIAL TESTS Rotator Cuff  Drop Arm Test: Positive Painful Arc (Pain from 60 to 120 degrees scaption): Positive Infraspinatus Muscle Test: Positive If all 3 tests positive, the probability of a full-thickness rotator cuff tear is 91%   Subacromial Impingement Painful Arc (Pain from 60 to 120 degrees scaption): Positive Empty Can: Unable to tolerate testing position  External Rotation Resistance: Positive   Bicep Tendon Pathology Speed (shoulder flexion to 90, external rotation, full elbow extension, and forearm supination with resistance: Positive Yergason's (resisted shoulder ER and supination/biceps tendon pathology): Positive        TODAY'S TREATMENT     04/02/24    Therapeutic Exercise - for HEP establishment, discussion on appropriate exercise/activity modification, PT education  Patient education on current condition, anatomy involved, prognosis, plan of care. Discussion on activity modification to prevent flare-up of condition, including substituting with opposite UE as able and avoidance of painful positions/postures.   Reviewed baseline home exercises and provided handout for [employer] program (see Access Code); tactile cueing and therapist demonstration utilized as needed for carryover of proper  technique to HEP.        PATIENT EDUCATION:  Education details: see above for patient education details Person educated: Patient Education method: Explanation, Demonstration, and Handouts Education comprehension: verbalized understanding and returned demonstration     HOME EXERCISE PROGRAM:   Access Code: [email or web address] URL: [email or web address] Date: 04/02/2024 Prepared by: Venetia Endo  Exercises - Flexion-Extension Shoulder Pendulum with Table Support  - 2 x daily - 7 x weekly - 2 sets - 10 reps - Horizontal Shoulder Pendulum with Table Support  - 2 x daily - 7 x weekly - 2 sets - 10 reps - Seated Scapular Retraction  - 2 x daily - 7 x weekly - 2 sets - 10 reps - 3sec hold    ASSESSMENT:   CLINICAL IMPRESSION: Patient is a 64 y.o. female who was seen today for physical therapy evaluation and treatment for chronic L shoulder pain and mobility deficits with comorbid neck pain and known C-spine degenerative changes. Pt has Hx of negative L shoulder radiographs. Clinical presentation is consistent with RTC pathology such as degenerative RTC tear. Cervical spine screen does not reproduce notable concordant symptoms. Pt does not have S&S consistent with classic radiculopathy. Pt has current deficits in: L shoulder AROM, deltoid/RTC strength, L shoulder stiffness, L anterior shoulder pain and pain along posterior cuff region, and decreased LUE functional use. Pt will continue to benefit from skilled PT services to address deficits and improve function.   OBJECTIVE IMPAIRMENTS: decreased ROM, decreased strength, hypomobility, impaired UE functional use, postural dysfunction, and pain.    ACTIVITY LIMITATIONS: carrying, lifting, sleeping, transfers, bed mobility, dressing, and reach over head   PARTICIPATION LIMITATIONS: meal prep, cleaning, laundry, and driving   PERSONAL FACTORS: Past/current experiences, Time since onset of injury/illness/exacerbation, and 3+ comorbidities:  (fibromyalgia, osteoporosis, depression, anxiety, HTN, HLD, thyroid  disease) are also affecting patient's functional outcome.    REHAB POTENTIAL: Good   CLINICAL DECISION MAKING: Evolving/moderate complexity   EVALUATION COMPLEXITY: Moderate     GOALS: Goals reviewed with patient? Yes   SHORT TERM GOALS: Target date: 04/26/2024   Pt will be independent with HEP to improve strength and decrease shoulder pain to improve pain-free function at home and work. Baseline: 04/03/23: Baseline HEP reviewed, RTC isometrics to be given at subsequent visit.  Goal status: INITIAL     LONG TERM GOALS: Target date: 05/31/2024   Pt will achieve L shoulder AROM within 10  degrees of contralateral upper limb as needed for reaching, overhead activity, and self-care ADLs  Baseline: 04/03/23: Significant AROM limitations in all planes Goal status: INITIAL   2.  Pt will decrease worst shoulder pain by at least 3 points on the NPRS in order to demonstrate clinically significant reduction in shoulder pain. Baseline: 04/03/23: 10/10 at worst Goal status: INITIAL   3.  Pt will improve quick DASH score by at least 8% in order to demonstrate clinically significant reduction in disability related to shoulder pain                  Baseline: 04/03/23: 45.5% Goal status: INITIAL   4. Pt will increase strength by at least 1/2 MMT grade in order to demonstrate improvement in strength and function            Baseline: 04/03/23: L shoulder 3+ to 4/5 Goal status: INITIAL     PLAN: PT FREQUENCY: 1-2x/week   PT DURATION: 6-8 weeks   PLANNED INTERVENTIONS: Therapeutic exercises, Therapeutic activity, Neuromuscular re-education, Balance training, Gait training, Patient/Family education, Self Care, Joint mobilization, Joint manipulation, Vestibular training, Canalith repositioning, Orthotic/Fit training, DME instructions, Dry Needling, Electrical stimulation, Spinal manipulation, Spinal mobilization, Cryotherapy, Moist heat,  Taping, Traction, Ultrasound, Ionotophoresis 4mg /ml Dexamethasone , Manual therapy, and Re-evaluation.   PLAN FOR NEXT SESSION: Check GHJ and scapulothoracic passive accessories. Continue with soft tissue mobilization and gentle manual techniques for pain control. Initiate isometrics and progressive shoulder AAROM as tolerated. Incorporate stretching and manual therapy for paracervical soft tissues prn.     Venetia Endo, PT, DPT #[id number]   Venetia ONEIDA Endo, PT 04/16/2024 12:55 PM  "

## 2024-04-17 NOTE — Addendum Note (Signed)
 Addended by: EDMAN MARSA PARAS on: 04/17/2024 07:47 PM   Modules accepted: Orders

## 2024-04-22 ENCOUNTER — Ambulatory Visit: Admitting: Physical Therapy

## 2024-04-24 ENCOUNTER — Ambulatory Visit: Admitting: Physical Therapy

## 2024-04-29 ENCOUNTER — Ambulatory Visit: Admitting: Physical Therapy

## 2024-04-30 ENCOUNTER — Encounter: Payer: Self-pay | Admitting: Family Medicine

## 2024-04-30 ENCOUNTER — Other Ambulatory Visit: Payer: Self-pay | Admitting: Family Medicine

## 2024-04-30 ENCOUNTER — Ambulatory Visit: Admitting: Family Medicine

## 2024-04-30 VITALS — BP 130/82 | HR 67 | Ht 62.0 in | Wt 132.0 lb

## 2024-04-30 DIAGNOSIS — E663 Overweight: Secondary | ICD-10-CM

## 2024-04-30 DIAGNOSIS — M797 Fibromyalgia: Secondary | ICD-10-CM | POA: Diagnosis not present

## 2024-04-30 DIAGNOSIS — F3341 Major depressive disorder, recurrent, in partial remission: Secondary | ICD-10-CM | POA: Diagnosis not present

## 2024-04-30 DIAGNOSIS — E782 Mixed hyperlipidemia: Secondary | ICD-10-CM

## 2024-04-30 DIAGNOSIS — F419 Anxiety disorder, unspecified: Secondary | ICD-10-CM | POA: Diagnosis not present

## 2024-04-30 DIAGNOSIS — M25512 Pain in left shoulder: Secondary | ICD-10-CM

## 2024-04-30 DIAGNOSIS — R7303 Prediabetes: Secondary | ICD-10-CM

## 2024-04-30 DIAGNOSIS — Z0000 Encounter for general adult medical examination without abnormal findings: Secondary | ICD-10-CM

## 2024-04-30 DIAGNOSIS — E039 Hypothyroidism, unspecified: Secondary | ICD-10-CM

## 2024-04-30 DIAGNOSIS — I700 Atherosclerosis of aorta: Secondary | ICD-10-CM

## 2024-04-30 DIAGNOSIS — G8929 Other chronic pain: Secondary | ICD-10-CM | POA: Diagnosis not present

## 2024-04-30 DIAGNOSIS — M7552 Bursitis of left shoulder: Secondary | ICD-10-CM

## 2024-04-30 DIAGNOSIS — G4733 Obstructive sleep apnea (adult) (pediatric): Secondary | ICD-10-CM

## 2024-04-30 MED ORDER — WEGOVY 1 MG/0.5ML ~~LOC~~ SOAJ: 1.0000 mg | SUBCUTANEOUS | 2 refills | Status: AC

## 2024-04-30 MED ORDER — DULOXETINE HCL 60 MG PO CPEP: 60.0000 mg | ORAL_CAPSULE | Freq: Every day | ORAL | 3 refills | Status: AC

## 2024-04-30 NOTE — Patient Instructions (Addendum)
 Thank you for coming to the office today.  DUE for FASTING BLOOD WORK (no food or drink after midnight before the lab appointment, only water or coffee without cream/sugar on the morning of)  SCHEDULE Lab Only visit in the morning at the clinic for lab draw in 3 MONTHS   - Make sure Lab Only appointment is at about 1 week before your next appointment, so that results will be available  For Lab Results, once available within 2-3 days of blood draw, you can can log in to MyChart online to view your results and a brief explanation. Also, we can discuss results at next follow-up visit.   Please schedule a Follow-up Appointment to: Return for 3 month fasting lab > 1 week later Annual Physical.  If you have any other questions or concerns, please feel free to call the office or send a message through MyChart. You may also schedule an earlier appointment if necessary.  Additionally, you may be receiving a survey about your experience at our office within a few days to 1 week by e-mail or mail. We value your feedback.  Marsa Officer, DO [facility], [place]

## 2024-04-30 NOTE — Progress Notes (Signed)
 "  Subjective:    Patient ID: Christine Berger, female    DOB: [birth year], 64 y.o.   MRN: [id number]  Christine Berger is a 64 y.o. female presenting on 04/30/2024 for Obesity (Weight management) and Shoulder Pain   HPI  Discussed the use of AI scribe software for clinical note transcription with the patient, who gave verbal consent to proceed.  History of Present Illness   Christine Berger is a 63 year old female who presents for a weight management update and shoulder pain follow-up.  Weight management - Initiated Wegovy  in November 01/2023 currently on maintenance dose of 1 mg weekly - Wegovy  provides effective appetite suppression and helps avoid overeating - Monitors weight frequently - Weight decreased from 136 pounds to 132 pounds as of April 30, 2024. - Administers Wegovy  1 mg subcutaneously once weekly for maintenance - Last dose picked up on April 02, 2024; last injection administered on previous Saturday. - Pays $22 per month for Wegovy .  Chronic Left Shoulder pain Impingement vs Rotator Pathology - Persistent shoulder pain since at least October 2025. - Received an injection for shoulder pain in October 2025. Limited relief - Xray  Imaging and physical therapy referral discussed in December 2025. - Previously informed of possible rotator cuff tear by another provider through PT - Applies heat at night to relax shoulder muscles. - Taking Methocarbamol PRN - Changed physical therapy provider due to insurance billing issues. - Scheduled to start physical therapy at [facility] on May 01, 2024.  Major Depression recurrent partial remission Anxiety Fibromyalgia Psychotropic medication use - Currently taking Buspar  once daily, although prescribed for twice daily use. - Has an adequate supply of Buspar  remaining. - Taking duloxetine , which requires a refill.           04/30/2024    3:31 PM 12/05/2023    3:38 PM 10/18/2023    1:51 PM  Depression screen PHQ 2/9   Decreased Interest 1 0 0  Down, Depressed, Hopeless 0 0 0  PHQ - 2 Score 1 0 0  Altered sleeping 1 0 0  Tired, decreased energy 1 1 0  Change in appetite 0 0 0  Feeling bad or failure about yourself  0 0 0  Trouble concentrating 0 0 0  Moving slowly or fidgety/restless 0 0 0  Suicidal thoughts 0 0 0  PHQ-9 Score 3 1  0   Difficult doing work/chores Not difficult at all       Data saved with a previous flowsheet row definition       04/30/2024    3:31 PM 12/05/2023    3:38 PM 10/18/2023    1:51 PM 07/19/2023    2:06 PM  GAD 7 : Generalized Anxiety Score  Nervous, Anxious, on Edge 0 0  0  0   Control/stop worrying 0 0  0  0   Worry too much - different things 0 0  0  0   Trouble relaxing 0 0  0  0   Restless 0 0  0  0   Easily annoyed or irritable 0 0  0  0   Afraid - awful might happen 0 0  0  0   Total GAD 7 Score 0 0 0 0  Anxiety Difficulty  Not difficult at all  Not difficult at all     Data saved with a previous flowsheet row definition    Social History[1]  Review of Systems Per HPI unless specifically indicated above  Objective:    BP 130/82 (BP Location: Right Arm, Patient Position: Sitting, Cuff Size: Normal)   Pulse 67   Ht 5' 2 (1.575 m)   Wt 132 lb (59.9 kg)   SpO2 98%   BMI 24.14 kg/m   Wt Readings from Last 3 Encounters:  04/30/24 132 lb (59.9 kg)  03/13/24 133 lb 6 oz (60.5 kg)  01/23/24 136 lb (61.7 kg)    Physical Exam Vitals and nursing note reviewed.  Constitutional:      General: She is not in acute distress.    Appearance: Normal appearance. She is well-developed. She is not diaphoretic.     Comments: Well-appearing, comfortable, cooperative  HENT:     Head: Normocephalic and atraumatic.  Eyes:     General:        Right eye: No discharge.        Left eye: No discharge.     Conjunctiva/sclera: Conjunctivae normal.  Cardiovascular:     Rate and Rhythm: Normal rate.  Pulmonary:     Effort: Pulmonary effort is normal.   Musculoskeletal:     Comments: Similar to previous exam. Left Shoulder reduced range of motion above head motions flexion abduction. Did not repeat test today but positive impingement last visit.  Skin:    General: Skin is warm and dry.     Findings: No erythema or rash.  Neurological:     Mental Status: She is alert and oriented to person, place, and time.  Psychiatric:        Mood and Affect: Mood normal.        Behavior: Behavior normal.        Thought Content: Thought content normal.     Comments: Well groomed, good eye contact, normal speech and thoughts     Results for orders placed or performed in visit on 01/17/24  ANA   Collection Time: 01/17/24  3:27 PM  Result Value Ref Range   Anti Nuclear Antibody (ANA) NEGATIVE NEGATIVE  Rheumatoid Factor   Collection Time: 01/17/24  3:27 PM  Result Value Ref Range   Rheumatoid fact SerPl-aCnc <10 <14 IU/mL  Cyclic citrul peptide antibody, IgG   Collection Time: 01/17/24  3:27 PM  Result Value Ref Range   Cyclic Citrullin Peptide Ab <83 UNITS  C-reactive protein   Collection Time: 01/17/24  3:27 PM  Result Value Ref Range   CRP <3.0 <8.0 mg/L  Sed Rate (ESR)   Collection Time: 01/17/24  3:27 PM  Result Value Ref Range   Sed Rate 9 0 - 30 mm/h      Assessment & Plan:   Problem List Items Addressed This Visit     Mixed hyperlipidemia (Chronic)   Relevant Medications   WEGOVY  1 MG/0.5ML SOAJ SQ injection   Anxiety   Relevant Medications   DULoxetine  (CYMBALTA ) 60 MG capsule   Fibromyalgia   Relevant Medications   DULoxetine  (CYMBALTA ) 60 MG capsule   Major depressive disorder, recurrent episode, in partial remission   Relevant Medications   DULoxetine  (CYMBALTA ) 60 MG capsule   Overweight (BMI 25.0-29.9)   Relevant Medications   WEGOVY  1 MG/0.5ML SOAJ SQ injection   Pre-diabetes - Primary   Relevant Medications   WEGOVY  1 MG/0.5ML SOAJ SQ injection   Other Visit Diagnoses       Chronic left shoulder pain        Relevant Medications   DULoxetine  (CYMBALTA ) 60 MG capsule     Chronic bursitis of left shoulder  Chronic left shoulder pain and bursitis Chronic pain with possible rotator cuff tear. Prefers non-surgical management. Has had subacromial injection without resolution, and initial PT but now setting up with new PT non hospital based at [facility] - Continue physical therapy at [facility]. Starting tomorrow - Use methocarbamol as needed, heat therapy at night - Consider MRI and orthopedic consultation if symptoms persist or worsen. There is concern from previous eval that she may have chronic torn rotator cuff  Overweight History Obesity Weight management with Wegovy . Current weight 132 pounds. Maintenance dose effective.  Goal to maintain current therapy instead of dose increase. Wegovy  Pill form considered if insurance issues arise. - Continue maintenance dose Wegovy  1mg  weekly at current dose with refills for three months. - Monitor weight and adjust treatment as needed. - Consider pill form of Wegovy  if insurance issues arise.  Major Depression recurrent partial remission Anxiety Fibromyalgia On medication management, currently stable Refill Duloxetine   General Health Maintenance Routine health maintenance discussed. Blood pressure slightly elevated. Upcoming urology and mammogram appointments. Next physical exam on April 23rd. - Scheduled blood work for fasting blood panel. - Continue routine health maintenance and screenings.      Lifestyle Documentation  We had a detailed discussion today reviewing course of lifestyle modifications to assist in weight loss and promote overall health. she remains motivated and committed to continuing these efforts while continues to take weight loss / appetite reducing GLP1 medication.  For diet she has been following caloric reduced diet, down to 1600-1800 calorie intake with emphasis on high protein foods and low carbohydrate  intake based on structured meal plan.  For exercise, has been continuing a structured regimen of cardiovascular exercise with walking. Currently not incorporating strength training or gym exercise at this time and emphasis is on cardio.   - Schedule follow-up in three months to assess weight and medication efficacy.    No orders of the defined types were placed in this encounter.   Meds ordered this encounter  Medications   WEGOVY  1 MG/0.5ML SOAJ SQ injection    Sig: Inject 1 mg into the skin once a week.    Dispense:  4 mL    Refill:  2    Maintenance dose continuation   DULoxetine  (CYMBALTA ) 60 MG capsule    Sig: Take 1 capsule (60 mg total) by mouth daily.    Dispense:  90 capsule    Refill:  3    Follow up plan: Return for 3 month fasting lab > 1 week later Annual Physical.  Future labs ordered for 07/24/24   Marsa Officer, DO [facility] 04/30/2024, 4:04 PM     [1]  Social History Tobacco Use   Smoking status: Never   Smokeless tobacco: Never  Vaping Use   Vaping status: Never Used  Substance Use Topics   Alcohol use: Not Currently    Alcohol/week: 1.0 - 2.0 standard drink of alcohol    Types: 1 - 2 Glasses of wine per week    Comment: monthly   Drug use: No   "

## 2024-05-01 ENCOUNTER — Ambulatory Visit

## 2024-05-08 ENCOUNTER — Ambulatory Visit: Admitting: Physical Therapy

## 2024-05-14 ENCOUNTER — Ambulatory Visit: Admitting: Physical Therapy

## 2024-05-16 ENCOUNTER — Ambulatory Visit: Admitting: Physical Therapy

## 2024-06-19 ENCOUNTER — Ambulatory Visit: Admitting: Urology

## 2024-07-24 ENCOUNTER — Other Ambulatory Visit

## 2024-07-30 ENCOUNTER — Encounter

## 2024-07-31 ENCOUNTER — Encounter: Admitting: Family Medicine
